# Patient Record
Sex: Male | Born: 1955
Health system: Southern US, Community
[De-identification: ages and names within clinical notes are randomized; demographics above are authoritative.]

## PROBLEM LIST (undated history)

## (undated) DIAGNOSIS — F0390 Unspecified dementia without behavioral disturbance: Secondary | ICD-10-CM

## (undated) DIAGNOSIS — I639 Cerebral infarction, unspecified: Secondary | ICD-10-CM

## (undated) DIAGNOSIS — J381 Polyp of vocal cord and larynx: Secondary | ICD-10-CM

## (undated) DIAGNOSIS — M1990 Unspecified osteoarthritis, unspecified site: Secondary | ICD-10-CM

## (undated) DIAGNOSIS — C801 Malignant (primary) neoplasm, unspecified: Secondary | ICD-10-CM

## (undated) DIAGNOSIS — I10 Essential (primary) hypertension: Secondary | ICD-10-CM

## (undated) DIAGNOSIS — N189 Chronic kidney disease, unspecified: Secondary | ICD-10-CM

## (undated) HISTORY — DX: Malignant (primary) neoplasm, unspecified: C80.1

## (undated) HISTORY — PX: STERNOTOMY: SHX1057

## (undated) HISTORY — DX: Cerebral infarction, unspecified: I63.9

## (undated) HISTORY — DX: Chronic kidney disease, unspecified: N18.9

## (undated) HISTORY — PX: THYMECTOMY: SHX1063

## (undated) HISTORY — PX: BACK SURGERY: SHX140

---

## 1986-11-25 ENCOUNTER — Encounter (INDEPENDENT_AMBULATORY_CARE_PROVIDER_SITE_OTHER): Payer: Self-pay | Admitting: Family Medicine

## 2001-11-12 ENCOUNTER — Emergency Department (HOSPITAL_COMMUNITY): Admission: EM | Admit: 2001-11-12 | Discharge: 2001-11-12 | Payer: Self-pay | Admitting: Emergency Medicine

## 2003-10-04 ENCOUNTER — Emergency Department (HOSPITAL_COMMUNITY): Admission: EM | Admit: 2003-10-04 | Discharge: 2003-10-04 | Payer: Self-pay | Admitting: Emergency Medicine

## 2004-07-04 ENCOUNTER — Emergency Department (HOSPITAL_COMMUNITY): Admission: EM | Admit: 2004-07-04 | Discharge: 2004-07-04 | Payer: Self-pay | Admitting: Emergency Medicine

## 2005-02-11 ENCOUNTER — Emergency Department (HOSPITAL_COMMUNITY): Admission: EM | Admit: 2005-02-11 | Discharge: 2005-02-11 | Payer: Self-pay | Admitting: Emergency Medicine

## 2006-09-13 ENCOUNTER — Emergency Department (HOSPITAL_COMMUNITY): Admission: EM | Admit: 2006-09-13 | Discharge: 2006-09-13 | Payer: Self-pay | Admitting: Emergency Medicine

## 2006-10-25 ENCOUNTER — Emergency Department (HOSPITAL_COMMUNITY): Admission: EM | Admit: 2006-10-25 | Discharge: 2006-10-25 | Payer: Self-pay | Admitting: Emergency Medicine

## 2006-11-07 ENCOUNTER — Emergency Department (HOSPITAL_COMMUNITY): Admission: EM | Admit: 2006-11-07 | Discharge: 2006-11-08 | Payer: Self-pay | Admitting: Emergency Medicine

## 2006-11-09 ENCOUNTER — Emergency Department (HOSPITAL_COMMUNITY): Admission: EM | Admit: 2006-11-09 | Discharge: 2006-11-09 | Payer: Self-pay | Admitting: Emergency Medicine

## 2007-07-29 ENCOUNTER — Emergency Department (HOSPITAL_COMMUNITY): Admission: EM | Admit: 2007-07-29 | Discharge: 2007-07-29 | Payer: Self-pay | Admitting: *Deleted

## 2007-08-18 ENCOUNTER — Telehealth (INDEPENDENT_AMBULATORY_CARE_PROVIDER_SITE_OTHER): Payer: Self-pay | Admitting: *Deleted

## 2007-08-18 ENCOUNTER — Ambulatory Visit (HOSPITAL_COMMUNITY): Admission: RE | Admit: 2007-08-18 | Discharge: 2007-08-18 | Payer: Self-pay | Admitting: Family Medicine

## 2007-08-18 ENCOUNTER — Ambulatory Visit: Payer: Self-pay | Admitting: Family Medicine

## 2007-08-18 DIAGNOSIS — M129 Arthropathy, unspecified: Secondary | ICD-10-CM | POA: Insufficient documentation

## 2007-08-18 DIAGNOSIS — E669 Obesity, unspecified: Secondary | ICD-10-CM | POA: Insufficient documentation

## 2007-08-18 DIAGNOSIS — R5383 Other fatigue: Secondary | ICD-10-CM

## 2007-08-18 DIAGNOSIS — R5381 Other malaise: Secondary | ICD-10-CM

## 2007-08-18 DIAGNOSIS — M5137 Other intervertebral disc degeneration, lumbosacral region: Secondary | ICD-10-CM

## 2007-08-18 DIAGNOSIS — I10 Essential (primary) hypertension: Secondary | ICD-10-CM

## 2007-08-19 LAB — CONVERTED CEMR LAB
ALT: 20 units/L (ref 0–53)
BUN: 13 mg/dL (ref 6–23)
Basophils Relative: 1 % (ref 0–1)
Cholesterol: 162 mg/dL (ref 0–200)
Eosinophils Absolute: 0.1 10*3/uL (ref 0.0–0.7)
Eosinophils Relative: 3 % (ref 0–5)
Hemoglobin: 15.3 g/dL (ref 13.0–17.0)
Lymphocytes Relative: 53 % — ABNORMAL HIGH (ref 12–46)
Lymphs Abs: 2.6 10*3/uL (ref 0.7–3.3)
MCV: 101.7 fL — ABNORMAL HIGH (ref 78.0–100.0)
Monocytes Absolute: 0.4 10*3/uL (ref 0.2–0.7)
PSA: 0.48 ng/mL (ref 0.10–4.00)
Potassium: 4.4 meq/L (ref 3.5–5.3)
RBC: 4.66 M/uL (ref 4.22–5.81)
RDW: 13 % (ref 11.5–14.0)
Sodium: 140 meq/L (ref 135–145)
Total Bilirubin: 0.5 mg/dL (ref 0.3–1.2)
Total CHOL/HDL Ratio: 4
Total Protein: 7.2 g/dL (ref 6.0–8.3)
WBC: 5 10*3/uL (ref 4.0–10.5)

## 2007-09-11 ENCOUNTER — Ambulatory Visit: Payer: Self-pay | Admitting: Family Medicine

## 2007-09-11 LAB — CONVERTED CEMR LAB
Cholesterol, target level: 200 mg/dL
LDL Goal: 130 mg/dL
OCCULT 1: NEGATIVE

## 2007-12-11 ENCOUNTER — Ambulatory Visit: Payer: Self-pay | Admitting: Family Medicine

## 2008-04-17 ENCOUNTER — Ambulatory Visit: Payer: Self-pay | Admitting: Family Medicine

## 2008-04-17 DIAGNOSIS — L259 Unspecified contact dermatitis, unspecified cause: Secondary | ICD-10-CM

## 2008-05-16 ENCOUNTER — Encounter (INDEPENDENT_AMBULATORY_CARE_PROVIDER_SITE_OTHER): Payer: Self-pay | Admitting: Family Medicine

## 2008-09-11 IMAGING — CT CT ABDOMEN W/ CM
1 of 3 series · 14 of 32 positions shown, 19 images · IV contrast (CONTRAST)
Comparison: none

HISTORY: Abdominal pain, nausea, vomiting

[Series 3365: — · axial · 0.77mm/px · z∈[+1286,+1716]mm · 14 of 98 slices shown, 19 images]
[im 6/98  soft-tissue]
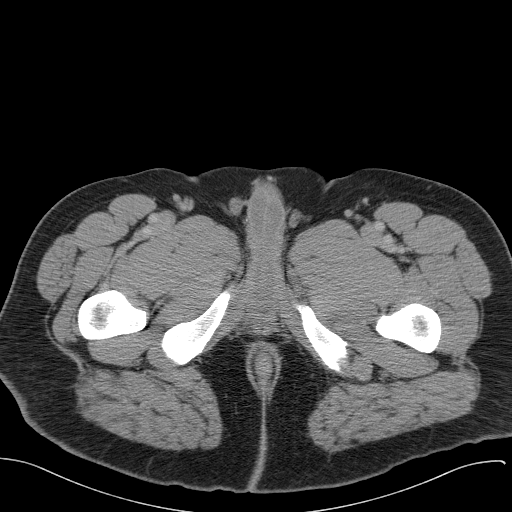
[im 6/98  bone]
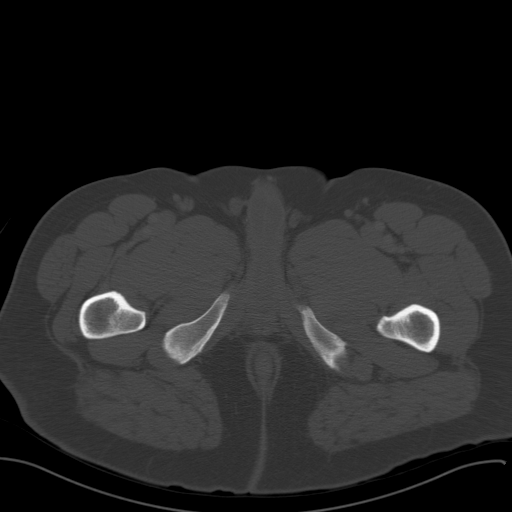
[im 12/98  soft-tissue]
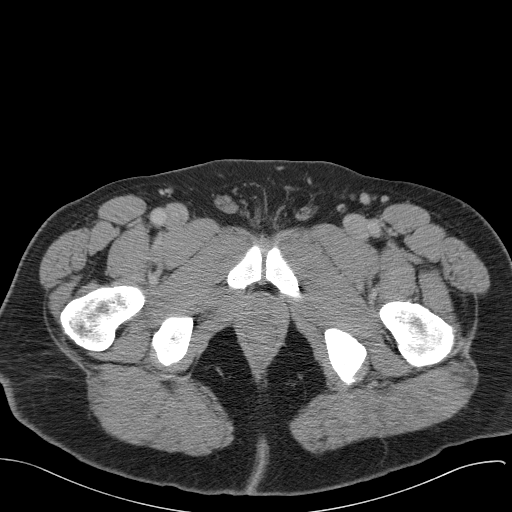
[im 23/98  soft-tissue]
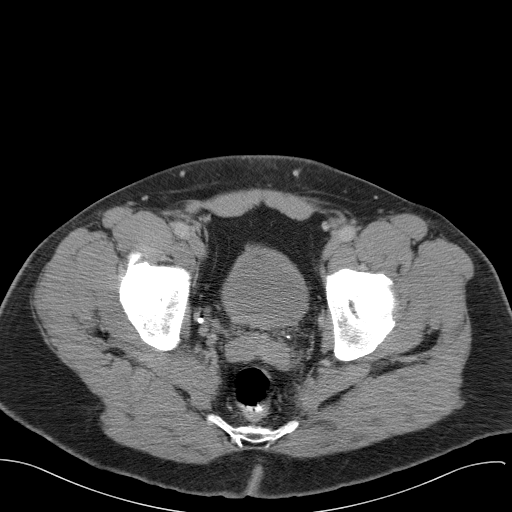
[im 29/98  soft-tissue]
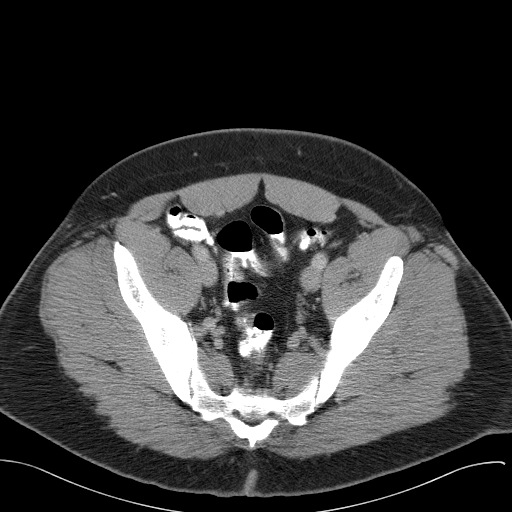
[im 35/98  soft-tissue]
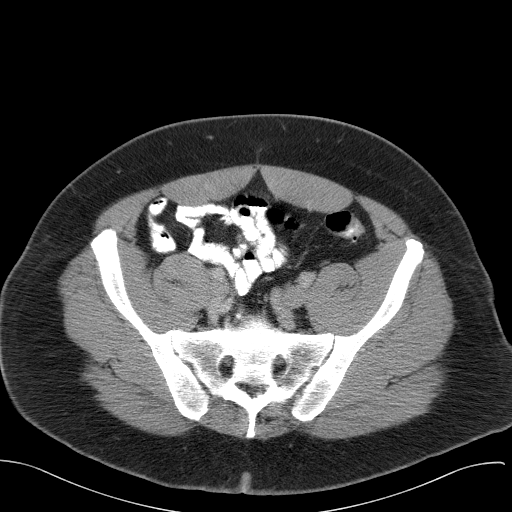
[im 40/98  soft-tissue]
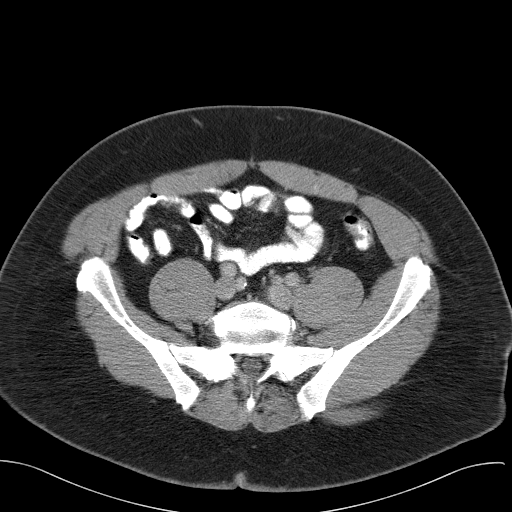
[im 52/98  soft-tissue]
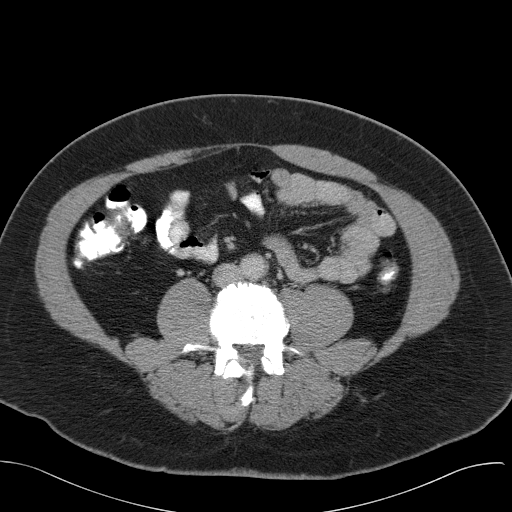
[im 58/98  soft-tissue]
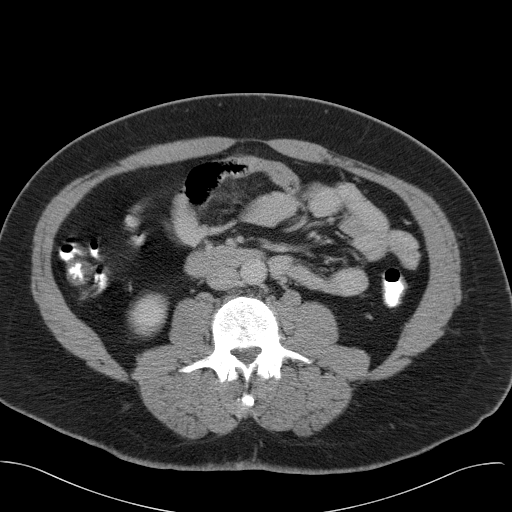
[im 63/98  soft-tissue]
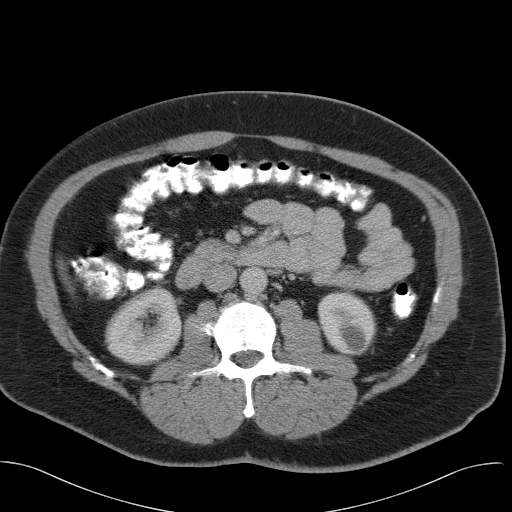
[im 63/98  bone]
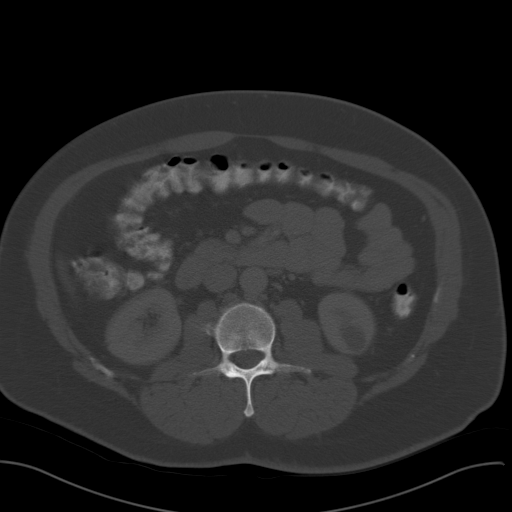
[im 69/98  soft-tissue]
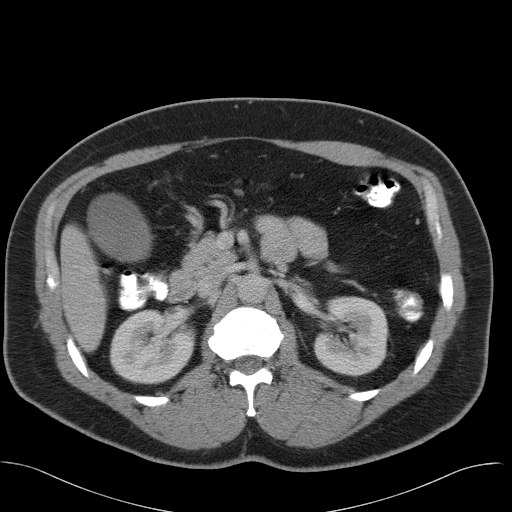
[im 75/98  soft-tissue]
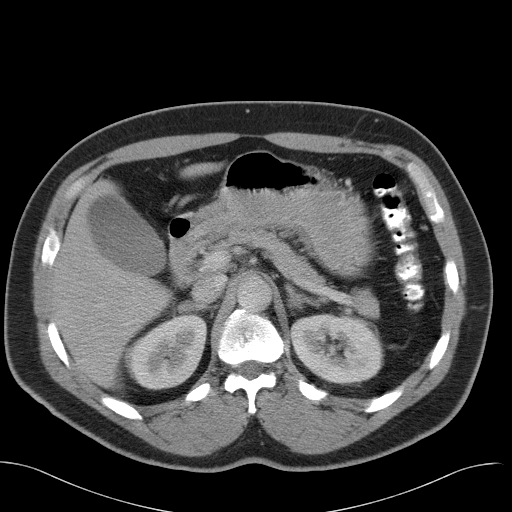
[im 75/98  lung]
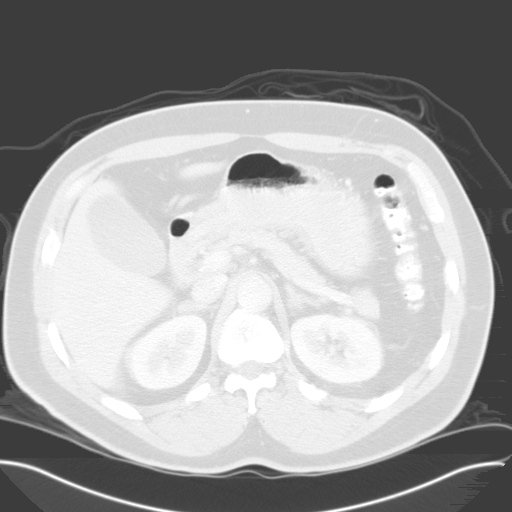
[im 80/98  lung]
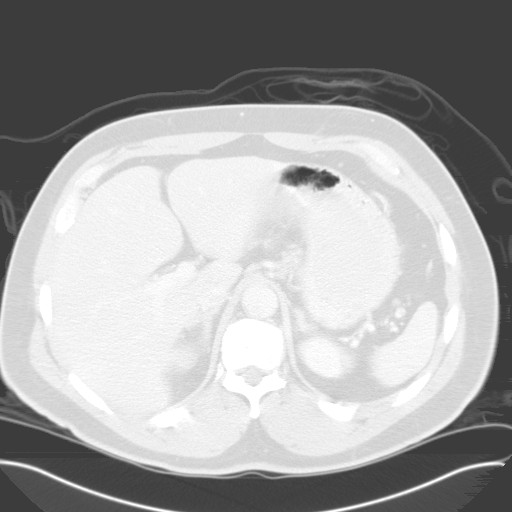
[im 86/98  soft-tissue]
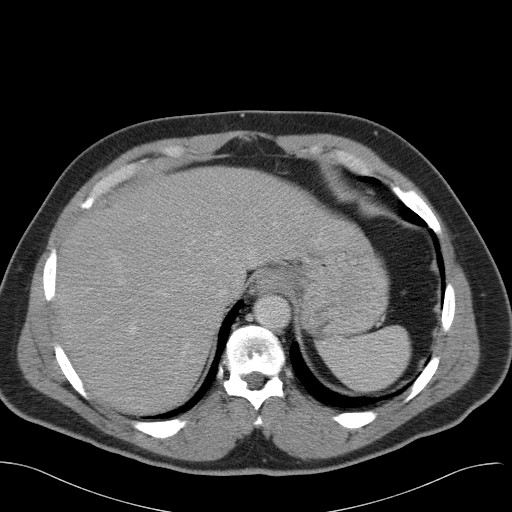
[im 86/98  lung]
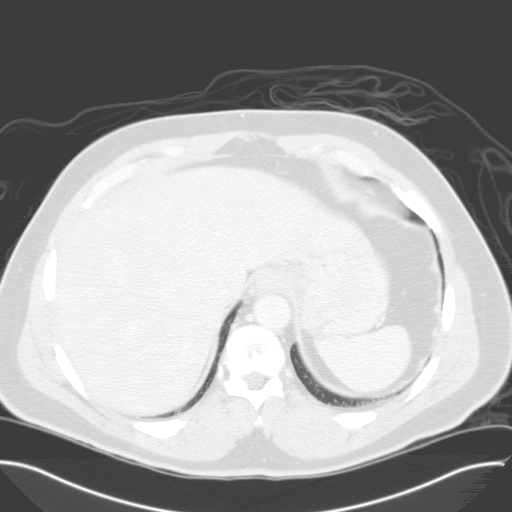
[im 92/98  soft-tissue]
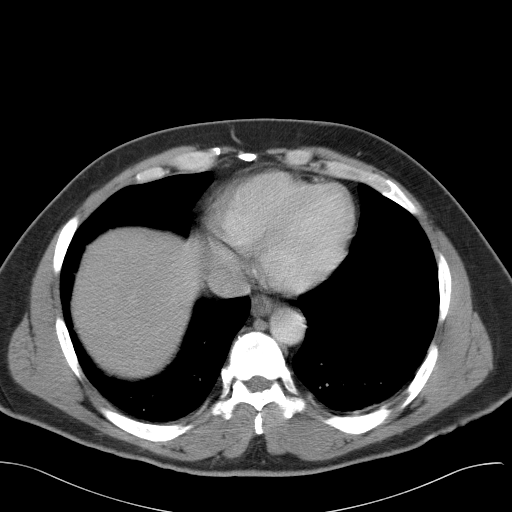
[im 92/98  lung]
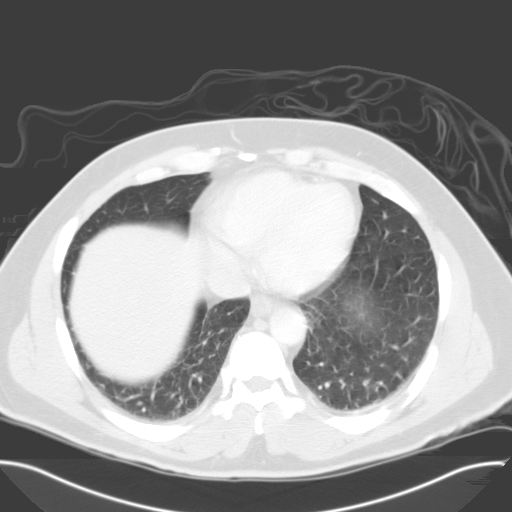

[14 of 32 positions shown; findings below may reference images not displayed]

CT ABDOMEN AND PELVIS WITH CONTRAST:

Multidetector helical CT imaging abdomen and pelvis performed.
Exam utilized dilute oral contrast and 100 cc [id number]-A33.
No prior studies for comparison.

CT ABDOMEN:

Minimal dependent atelectasis left lower lobe.
Small cyst lower pole left kidney 2.1 x 1.8 cm image 35.
Tiny low attenuation foci right kidney too small to characterize.
No other abnormalities of liver, spleen, pancreas, kidneys, or left adrenal
gland.
Low attenuation nodule right adrenal gland, 1.7 x 1.2 cm image 17, likely tiny
adenoma.
No upper abdominal mass, adenopathy, free fluid, or inflammatory process.
IMPRESSION: Small left renal cyst.
Probable small right adrenal adenoma.
Minimal left lower lobe atelectasis.

CT PELVIS:

Normal appendix.
No pelvic mass, adenopathy, or free fluid.
Pelvic bowel loops normal.
Scattered pelvic phleboliths without distal ureteral calcification or
dilatation.
Bladder and bones unremarkable.
IMPRESSION: No acute pelvic abnormalities.

## 2008-09-14 ENCOUNTER — Emergency Department (HOSPITAL_COMMUNITY): Admission: EM | Admit: 2008-09-14 | Discharge: 2008-09-14 | Payer: Self-pay | Admitting: Emergency Medicine

## 2008-10-22 ENCOUNTER — Emergency Department (HOSPITAL_COMMUNITY): Admission: EM | Admit: 2008-10-22 | Discharge: 2008-10-22 | Payer: Self-pay | Admitting: Emergency Medicine

## 2009-07-04 ENCOUNTER — Emergency Department (HOSPITAL_COMMUNITY): Admission: EM | Admit: 2009-07-04 | Discharge: 2009-07-04 | Payer: Self-pay | Admitting: Emergency Medicine

## 2009-10-15 ENCOUNTER — Emergency Department (HOSPITAL_COMMUNITY): Admission: EM | Admit: 2009-10-15 | Discharge: 2009-10-15 | Payer: Self-pay | Admitting: Emergency Medicine

## 2011-04-04 LAB — DIFFERENTIAL
Basophils Absolute: 0.1 10*3/uL (ref 0.0–0.1)
Basophils Relative: 1 % (ref 0–1)
Eosinophils Relative: 2 % (ref 0–5)
Lymphocytes Relative: 40 % (ref 12–46)
Neutro Abs: 3.3 10*3/uL (ref 1.7–7.7)

## 2011-04-04 LAB — CBC
Hemoglobin: 15.1 g/dL (ref 13.0–17.0)
RBC: 4.37 MIL/uL (ref 4.22–5.81)
RDW: 13.1 % (ref 11.5–15.5)
WBC: 7.1 10*3/uL (ref 4.0–10.5)

## 2011-04-04 LAB — BASIC METABOLIC PANEL
Calcium: 9.2 mg/dL (ref 8.4–10.5)
GFR calc Af Amer: >60 mL/min (ref >60)
GFR calc non Af Amer: >60 mL/min (ref >60)
Sodium: 138 mEq/L (ref 135–145)

## 2011-09-27 LAB — GLUCOSE, CAPILLARY

## 2012-02-12 ENCOUNTER — Encounter (HOSPITAL_COMMUNITY): Payer: Self-pay | Admitting: *Deleted

## 2012-02-12 ENCOUNTER — Inpatient Hospital Stay (HOSPITAL_COMMUNITY)
Admission: EM | Admit: 2012-02-12 | Discharge: 2012-02-14 | DRG: 066 | Disposition: A | Discharge status: Home / Self Care | Payer: Medicaid Other | Attending: Internal Medicine | Admitting: Internal Medicine

## 2012-02-12 ENCOUNTER — Emergency Department (HOSPITAL_COMMUNITY): Payer: Medicaid Other

## 2012-02-12 ENCOUNTER — Other Ambulatory Visit: Payer: Self-pay

## 2012-02-12 DIAGNOSIS — M129 Arthropathy, unspecified: Secondary | ICD-10-CM | POA: Diagnosis present

## 2012-02-12 DIAGNOSIS — R209 Unspecified disturbances of skin sensation: Secondary | ICD-10-CM | POA: Diagnosis present

## 2012-02-12 DIAGNOSIS — I6350 Cerebral infarction due to unspecified occlusion or stenosis of unspecified cerebral artery: Principal | ICD-10-CM | POA: Diagnosis present

## 2012-02-12 DIAGNOSIS — R5381 Other malaise: Secondary | ICD-10-CM

## 2012-02-12 DIAGNOSIS — G839 Paralytic syndrome, unspecified: Secondary | ICD-10-CM | POA: Diagnosis present

## 2012-02-12 DIAGNOSIS — I639 Cerebral infarction, unspecified: Secondary | ICD-10-CM

## 2012-02-12 DIAGNOSIS — E669 Obesity, unspecified: Secondary | ICD-10-CM

## 2012-02-12 DIAGNOSIS — M5137 Other intervertebral disc degeneration, lumbosacral region: Secondary | ICD-10-CM

## 2012-02-12 DIAGNOSIS — I10 Essential (primary) hypertension: Secondary | ICD-10-CM | POA: Diagnosis present

## 2012-02-12 DIAGNOSIS — Z720 Tobacco use: Secondary | ICD-10-CM | POA: Diagnosis present

## 2012-02-12 DIAGNOSIS — F17200 Nicotine dependence, unspecified, uncomplicated: Secondary | ICD-10-CM | POA: Diagnosis present

## 2012-02-12 DIAGNOSIS — Z23 Encounter for immunization: Secondary | ICD-10-CM

## 2012-02-12 DIAGNOSIS — E876 Hypokalemia: Secondary | ICD-10-CM | POA: Diagnosis present

## 2012-02-12 DIAGNOSIS — L259 Unspecified contact dermatitis, unspecified cause: Secondary | ICD-10-CM

## 2012-02-12 HISTORY — DX: Essential (primary) hypertension: I10

## 2012-02-12 HISTORY — DX: Unspecified osteoarthritis, unspecified site: M19.90

## 2012-02-12 LAB — COMPREHENSIVE METABOLIC PANEL
ALT: 8 U/L (ref 0–53)
Albumin: 3.6 g/dL (ref 3.5–5.2)
CO2: 27 mEq/L (ref 19–32)
Creatinine, Ser: 1.1 mg/dL (ref 0.50–1.35)
GFR calc Af Amer: 86 mL/min — ABNORMAL LOW (ref >90)
Total Bilirubin: 0.4 mg/dL (ref 0.3–1.2)

## 2012-02-12 LAB — CBC
MCV: 95.9 fL (ref 78.0–100.0)
Platelets: 322 10*3/uL (ref 150–400)
RBC: 4.58 MIL/uL (ref 4.22–5.81)

## 2012-02-12 LAB — DIFFERENTIAL
Basophils Absolute: 0 10*3/uL (ref 0.0–0.1)
Basophils Relative: 1 % (ref 0–1)
Lymphocytes Relative: 52 % — ABNORMAL HIGH (ref 12–46)
Monocytes Absolute: 0.5 10*3/uL (ref 0.1–1.0)
Monocytes Relative: 9 % (ref 3–12)
Neutro Abs: 2 10*3/uL (ref 1.7–7.7)
Neutrophils Relative %: 35 % — ABNORMAL LOW (ref 43–77)

## 2012-02-12 LAB — POCT I-STAT TROPONIN I

## 2012-02-12 LAB — PROTIME-INR: INR: 1.05 (ref 0.00–1.49)

## 2012-02-12 MED ORDER — NICOTINE 14 MG/24HR TD PT24
14.0000 mg | MEDICATED_PATCH | Freq: Every day | TRANSDERMAL | Status: DC
  Administered 2012-02-12 – 2012-02-13 (×2): 14 mg via TRANSDERMAL
  Filled 2012-02-12 (×2): qty 1

## 2012-02-12 MED ORDER — ONDANSETRON HCL 4 MG/2ML IJ SOLN: 4.0000 mg | Freq: Four times a day (QID) | INTRAMUSCULAR | Status: DC | PRN

## 2012-02-12 MED ORDER — POTASSIUM CHLORIDE CRYS ER 20 MEQ PO TBCR
40.0000 meq | EXTENDED_RELEASE_TABLET | Freq: Once | ORAL | Status: AC
  Administered 2012-02-12: 40 meq via ORAL
  Filled 2012-02-12: qty 2

## 2012-02-12 MED ORDER — ASPIRIN 325 MG PO TABS
325.0000 mg | ORAL_TABLET | Freq: Every day | ORAL | Status: DC
  Administered 2012-02-13 – 2012-02-14 (×2): 325 mg via ORAL
  Filled 2012-02-12 (×2): qty 1

## 2012-02-12 MED ORDER — SODIUM CHLORIDE 0.9 % IV SOLN
INTRAVENOUS | Status: DC
  Administered 2012-02-12: via INTRAVENOUS

## 2012-02-12 MED ORDER — ASPIRIN 300 MG RE SUPP
300.0000 mg | Freq: Every day | RECTAL | Status: DC
Start: 2012-02-13 — End: 2012-02-13
  Filled 2012-02-12 (×2): qty 1

## 2012-02-12 MED ORDER — SODIUM CHLORIDE 0.9 % IJ SOLN
3.0000 mL | Freq: Two times a day (BID) | INTRAMUSCULAR | Status: DC
  Administered 2012-02-12 – 2012-02-13 (×2): 3 mL via INTRAVENOUS
  Filled 2012-02-12 (×3): qty 3

## 2012-02-12 MED ORDER — ENOXAPARIN SODIUM 40 MG/0.4ML ~~LOC~~ SOLN
40.0000 mg | SUBCUTANEOUS | Status: DC
  Administered 2012-02-12 – 2012-02-13 (×2): 40 mg via SUBCUTANEOUS
  Filled 2012-02-12 (×2): qty 0.4

## 2012-02-12 MED ORDER — SENNOSIDES-DOCUSATE SODIUM 8.6-50 MG PO TABS: 1.0000 | ORAL_TABLET | Freq: Every evening | ORAL | Status: DC | PRN

## 2012-02-12 MED ORDER — ACETAMINOPHEN 650 MG RE SUPP: 650.0000 mg | RECTAL | Status: DC | PRN

## 2012-02-12 MED ORDER — HYDRALAZINE HCL 20 MG/ML IJ SOLN
10.0000 mg | Freq: Four times a day (QID) | INTRAMUSCULAR | Status: DC | PRN
  Administered 2012-02-13: 10 mg via INTRAVENOUS
  Filled 2012-02-12: qty 1

## 2012-02-12 MED ORDER — ACETAMINOPHEN 325 MG PO TABS: 650.0000 mg | ORAL_TABLET | ORAL | Status: DC | PRN

## 2012-02-12 NOTE — ED Notes (Signed)
Pt c/o left hand numbness since this morning.

## 2012-02-12 NOTE — ED Notes (Signed)
MD at bedside. 

## 2012-02-12 NOTE — ED Notes (Signed)
Pt has asked nurse to remove IV multiple times, states "I'ma gonna be all right", EDP in room to review results of CT and pt explained about leaving AMA and verbalizes understanding, pt to call wife

## 2012-02-12 NOTE — ED Notes (Signed)
Dr. Rito Ehrlich in room

## 2012-02-12 NOTE — ED Notes (Signed)
C/o left hand numbness and unable to grab items since this morning at 0930, grip is weaker to left hand than right hand, pt thought is was arthritis and applied Icy Hot cream to left hand

## 2012-02-12 NOTE — H&P (Signed)
Derrick Gaines is an 56 y.o. male.    PCP: [facility] in [employer] Complaint: Left hand numbness  HPI: This is a 56 year old, African American male, with a past medical history of hypertension, arthritis, who was in his usual state of health till this morning. When he woke up from his sleep and noticed that his left hand was numb. More specifically, he, said that his thumb and the index finger were numb. He couldn't button his shirt. He thought it was related to arthritis and took a couple of pain medications with no relief. His wife came home later in the evening and when he mentioned this to her she asked him to go to the emergency department. Patient denies any headache. Denies any seizure activity. Denies similar episodes in the past. Denies any difficulty with swallowing over the course of the day. Denies any difficulty with speech. He does notice some weakness in the left hand. Denies any weakness in the left leg. Denies any neck pain.   Home Medications: Prior to Admission medications   Medication Sig Start Date End Date Taking? Authorizing Provider  UNKNOWN TO PATIENT    Yes Historical Provider, MD   He thinks he takes Ramipril for hypertension, however, he is unsure of the dose.  Allergies: No Known Allergies  Past Medical History: Past Medical History  Diagnosis Date  . Hypertension   . Arthritis     Past Surgical History  Procedure Date  . Back surgery     Social History:  reports that he has been smoking.  He does not have any smokeless tobacco history on file. He reports that he does not drink alcohol or use illicit drugs.  Family History:  Family History  Problem Relation Age of Onset  . Diabetes Mother     Review of Systems - History obtained from the patient General ROS: negative Psychological ROS: negative Ophthalmic ROS: negative ENT ROS: negative Allergy and Immunology ROS: negative Hematological and Lymphatic ROS:  negative Endocrine ROS: negative Respiratory ROS: no cough, shortness of breath, or wheezing Cardiovascular ROS: no chest pain or dyspnea on exertion Gastrointestinal ROS: no abdominal pain, change in bowel habits, or black or bloody stools Genito-Urinary ROS: no dysuria, trouble voiding, or hematuria Musculoskeletal ROS: negative Neurological ROS: as in hpi Dermatological ROS: Negative  Physical Examination Blood pressure 153/83, pulse 73, temperature 98.5 F (36.9 C), resp. rate 20, weight 120.657 kg (266 lb), SpO2 99.00%.  General appearance: alert, cooperative, appears stated age and no distress Head: Normocephalic, without obvious abnormality Eyes: conjunctivae/corneas clear. PERRL, EOM's intact.  Throat: lips, mucosa, and tongue normal; teeth and gums normal Neck: no adenopathy, no carotid bruit, no JVD, supple, symmetrical, trachea midline and thyroid not enlarged, symmetric, no tenderness/mass/nodules Back: symmetric, no curvature. ROM normal. No CVA tenderness. Resp: clear to auscultation bilaterally Cardio: regular rate and rhythm, S1, S2 normal, no murmur, click, rub or gallop GI: soft, non-tender; bowel sounds normal; no masses,  no organomegaly Extremities: extremities normal, atraumatic, no cyanosis or edema Pulses: 2+ and symmetric Skin: Skin color, texture, turgor normal. No rashes or lesions Lymph nodes: Cervical, supraclavicular, and axillary nodes normal. Neurologic: There was a left facial droop. Strength was completely normal in the right side, as well as in the left lower extremity. Most of the abnormalities were noted in the left upper extremity specifically, in the hand. He did have 3/5 weakness in the joints of the thumb and index finger. He could not make  a fist properly. Couldn't grasp my fingers, with adequate strength. Pronator drift was noted on the left side. Celebellar signs were sluggish on the left side. Finger to nose was significantly impaired on the  left side. Gait was not assessed. There was decreased sensation in those 2 fingers as well.`  Laboratory Data: Results for orders placed during the hospital encounter of 02/12/12 (from the past 48 hour(s))  CBC     Status: Normal   Collection Time   02/12/12  8:03 PM      Component Value Range Comment   WBC 5.6  4.0 - 10.5 (K/uL)    RBC 4.58  4.22 - 5.81 (MIL/uL)    Hemoglobin 15.0  13.0 - 17.0 (g/dL)    HCT 16.1  09.6 - 04.5 (%)    MCV 95.9  78.0 - 100.0 (fL)    MCH 32.8  26.0 - 34.0 (pg)    MCHC 34.2  30.0 - 36.0 (g/dL)    RDW 40.9  81.1 - 91.4 (%)    Platelets 322  150 - 400 (K/uL)   DIFFERENTIAL     Status: Abnormal   Collection Time   02/12/12  8:03 PM      Component Value Range Comment   Neutrophils Relative 35 (*) 43 - 77 (%)    Neutro Abs 2.0  1.7 - 7.7 (K/uL)    Lymphocytes Relative 52 (*) 12 - 46 (%)    Lymphs Abs 2.9  0.7 - 4.0 (K/uL)    Monocytes Relative 9  3 - 12 (%)    Monocytes Absolute 0.5  0.1 - 1.0 (K/uL)    Eosinophils Relative 3  0 - 5 (%)    Eosinophils Absolute 0.2  0.0 - 0.7 (K/uL)    Basophils Relative 1  0 - 1 (%)    Basophils Absolute 0.0  0.0 - 0.1 (K/uL)   COMPREHENSIVE METABOLIC PANEL     Status: Abnormal   Collection Time   02/12/12  8:03 PM      Component Value Range Comment   Sodium 135  135 - 145 (mEq/L)    Potassium 3.2 (*) 3.5 - 5.1 (mEq/L)    Chloride 100  96 - 112 (mEq/L)    CO2 27  19 - 32 (mEq/L)    Glucose, Bld 103 (*) 70 - 99 (mg/dL)    BUN 8  6 - 23 (mg/dL)    Creatinine, Ser 7.82  0.50 - 1.35 (mg/dL)    Calcium 9.4  8.4 - 10.5 (mg/dL)    Total Protein 7.3  6.0 - 8.3 (g/dL)    Albumin 3.6  3.5 - 5.2 (g/dL)    AST 17  0 - 37 (U/L)    ALT 8  0 - 53 (U/L)    Alkaline Phosphatase 60  39 - 117 (U/L)    Total Bilirubin 0.4  0.3 - 1.2 (mg/dL)    GFR calc non Af Amer 74 (*) >90 (mL/min)    GFR calc Af Amer 86 (*) >90 (mL/min)   APTT     Status: Normal   Collection Time   02/12/12  8:03 PM      Component Value Range Comment   aPTT  36  24 - 37 (seconds)   PROTIME-INR     Status: Normal   Collection Time   02/12/12  8:03 PM      Component Value Range Comment   Prothrombin Time 13.9  11.6 - 15.2 (seconds)    INR 1.05  0.00 - 1.49    POCT I-STAT TROPONIN I     Status: Normal   Collection Time   02/12/12  8:06 PM      Component Value Range Comment   Troponin i, poc 0.00  0.00 - 0.08 (ng/mL)    Comment 3              Radiology Reports: Ct Head Wo Contrast  02/12/2012  *RADIOLOGY REPORT*  Clinical Data:  History of left hand numbness, hypertension and blood thinners.  CT HEAD WITHOUT CONTRAST CT CERVICAL SPINE WITHOUT CONTRAST  Technique:  Multidetector CT imaging of the head and cervical spine was performed following the standard protocol without intravenous contrast.  Multiplanar CT image reconstructions of the cervical spine were also generated.  Comparison:   None  CT HEAD  Findings:  There is low density in the right white matter tract and the right internal capsule.  Findings are most compatible with prior ischemic changes.  Difficult to exclude an acute component.  There is deformity of the left temporal bone and left lower frontal bone suggesting an old fracture.  This low density along the periphery of the left frontal and temporal lobes that may be artifactual.  There is no evidence to suggest acute hemorrhage.  No evidence for a mass lesion, midline shift or hydrocephalus.  Mild mucosal disease in the right ethmoid air cells and right frontal sinus.  Deformity of the bilateral nasal bones.  IMPRESSION: Evidence for old ischemic changes in the right white matter tract and right internal capsule.  An acute or subacute component in this area cannot be excluded and could be further evaluated with MRI.  Deformity along the left side of the skull is suggestive for an old injury.  There is also evidence of old nasal bone fractures.  No evidence for acute hemorrhage.  CT CERVICAL SPINE  Findings: Negative for acute fracture or  dislocation.  No evidence for soft tissue swelling in the neck.  Lung apices are clear. Normal alignment of the cervical spine, including the cervicothoracic junction.  IMPRESSION: No acute bony abnormality in the cervical spine.  Original Report Authenticated By: Richarda Overlie, M.D.   Ct Cervical Spine Wo Contrast  02/12/2012  *RADIOLOGY REPORT*  Clinical Data:  History of left hand numbness, hypertension and blood thinners.  CT HEAD WITHOUT CONTRAST CT CERVICAL SPINE WITHOUT CONTRAST  Technique:  Multidetector CT imaging of the head and cervical spine was performed following the standard protocol without intravenous contrast.  Multiplanar CT image reconstructions of the cervical spine were also generated.  Comparison:   None  CT HEAD  Findings:  There is low density in the right white matter tract and the right internal capsule.  Findings are most compatible with prior ischemic changes.  Difficult to exclude an acute component.  There is deformity of the left temporal bone and left lower frontal bone suggesting an old fracture.  This low density along the periphery of the left frontal and temporal lobes that may be artifactual.  There is no evidence to suggest acute hemorrhage.  No evidence for a mass lesion, midline shift or hydrocephalus.  Mild mucosal disease in the right ethmoid air cells and right frontal sinus.  Deformity of the bilateral nasal bones.  IMPRESSION: Evidence for old ischemic changes in the right white matter tract and right internal capsule.  An acute or subacute component in this area cannot be excluded and could be further evaluated with MRI.  Deformity along the  left side of the skull is suggestive for an old injury.  There is also evidence of old nasal bone fractures.  No evidence for acute hemorrhage.  CT CERVICAL SPINE  Findings: Negative for acute fracture or dislocation.  No evidence for soft tissue swelling in the neck.  Lung apices are clear. Normal alignment of the cervical spine,  including the cervicothoracic junction.  IMPRESSION: No acute bony abnormality in the cervical spine.  Original Report Authenticated By: Richarda Overlie, M.D.    Electrocardiogram: Sinus rhythm at 68 beats per minute. Axis is normal. Intervals appear to be in the normal range. No Q waves. No concerning ST or T-wave changes are noted.  Assessment/Plan  Principal Problem:  *CVA (cerebral vascular accident) Active Problems:  HYPERTENSION  ARTHRITIS  Tobacco abuse   #1 cerebrovascular accident: Patient will undergo the stroke workup. A urine drug screen will be checked. Speech therapy evaluation will be ordered. The RN in the emergency department tells me that he's passed his swallow screen. Physical therapy and occupational therapy will be asked to see this patient. Lipid panel will be checked in the morning, and he'll be put on aspirin for now. Patient will benefit from being on a statin medication. However, we will first await all the workup to be completed.  #2 history of hypertension. We will allow permissive hypertension for now. We will await verification of his home medications.  #3 tobacco abuse: Nicotine patch will prescribe. Counseling will be provided.  #4 hypokalemia: Potassium will be repleted. Labs will be rechecked in the morning. Magnesium level will be checked in the morning  DVT, prophylaxis will be provided. Patient is a full code.  Further management decisions will depend on results of further testing and patient's response to treatment  [facility] Pager [phone]  02/12/2012, 10:15 PM

## 2012-02-12 NOTE — ED Notes (Signed)
Attempted to call report to receiving nurse Misty Stanley, not able to take report and will call back, extension given

## 2012-02-12 NOTE — ED Provider Notes (Signed)
History     CSN: [id number]  Arrival date & time 02/12/12  6213   First MD Initiated Contact with Patient 02/12/12 1937      Chief Complaint  Patient presents with  . Extremity Weakness    (Consider location/radiation/quality/duration/timing/severity/associated sxs/prior treatment) HPI  He should relates he woke up this morning and his left hand was numb. He states the numbness extends to about the level of his wrist. He states he's been having difficulty making his hand form a grip or doing activities such as tying his shoes or buttoning his close. He denies any involvement of his face or left leg. He denies having difficulty walking, swallowing, drinking. He denies headache, neck pain. He states he's never had this happen before. He relates he's been unemployed and he's been under a lot of stress recently. He states the numbness is not improving.   PCP [facility]  Past Medical History  Diagnosis Date  . Hypertension     Past Surgical History  Procedure Date  . Back surgery     History reviewed. No pertinent family history.  History  Substance Use Topics  . Smoking status: Current Everyday Smoker 1 ppd  . Smokeless tobacco: Not on file  . Alcohol Use: No   unemployed    Review of Systems  All other systems reviewed and are negative.    Allergies  Review of patient's allergies indicates no known allergies.  Home Medications   Current Outpatient Rx  Name Route Sig Dispense Refill  . UNKNOWN TO PATIENT      ? ramipril   BP 154/100  Temp 97.9 F (36.6 C)  Resp 20  Wt 266 lb (120.657 kg)  SpO2 100%  Vital signs normal except mild hypertension   Physical Exam  Nursing note and vitals reviewed. Constitutional: He is oriented to person, place, and time. He appears well-developed and well-nourished.  Non-toxic appearance. He does not appear ill. No distress.  HENT:  Head: Normocephalic and atraumatic.  Right Ear: External ear normal.  Left Ear:  External ear normal.  Nose: Nose normal. No mucosal edema or rhinorrhea.  Mouth/Throat: Oropharynx is clear and moist and mucous membranes are normal. No dental abscesses or uvula swelling.  Eyes: Conjunctivae and EOM are normal. Pupils are equal, round, and reactive to light.  Neck: Normal range of motion and full passive range of motion without pain. Neck supple.  Cardiovascular: Normal rate, regular rhythm and normal heart sounds.  Exam reveals no gallop and no friction rub.   No murmur heard. Pulmonary/Chest: Effort normal and breath sounds normal. No respiratory distress. He has no wheezes. He has no rhonchi. He has no rales. He exhibits no tenderness and no crepitus.  Abdominal: Soft. Normal appearance and bowel sounds are normal. He exhibits no distension. There is no tenderness. There is no rebound and no guarding.  Musculoskeletal: Normal range of motion. He exhibits no edema and no tenderness.       Moves all extremities well.   Neurological: He is alert and oriented to person, place, and time. He has normal strength. No cranial nerve deficit.       Patient has pronator drift on the left, he has mild weaknee of hisleft grip compared to the right. Other wise he is neurologically intact.  Skin: Skin is warm, dry and intact. No rash noted. No erythema. No pallor.  Psychiatric: He has a normal mood and affect. His speech is normal and behavior is normal. His mood appears  not anxious.    ED Course  Procedures (including critical care time)  Patient given his CT report and 5 she should stay in the hospital. Patient does not want to stay. Called his wife and she told him he needs to stay in the hospital so now patient is agreeable to stay.  21:42 Dr Rito Ehrlich admit to tele to him    Results for orders placed during the hospital encounter of 02/12/12  CBC      Component Value Range   WBC 5.6  4.0 - 10.5 (K/uL)   RBC 4.58  4.22 - 5.81 (MIL/uL)   Hemoglobin 15.0  13.0 - 17.0 (g/dL)    HCT 09.6  04.5 - 40.9 (%)   MCV 95.9  78.0 - 100.0 (fL)   MCH 32.8  26.0 - 34.0 (pg)   MCHC 34.2  30.0 - 36.0 (g/dL)   RDW 81.1  91.4 - 78.2 (%)   Platelets 322  150 - 400 (K/uL)  DIFFERENTIAL      Component Value Range   Neutrophils Relative 35 (*) 43 - 77 (%)   Neutro Abs 2.0  1.7 - 7.7 (K/uL)   Lymphocytes Relative 52 (*) 12 - 46 (%)   Lymphs Abs 2.9  0.7 - 4.0 (K/uL)   Monocytes Relative 9  3 - 12 (%)   Monocytes Absolute 0.5  0.1 - 1.0 (K/uL)   Eosinophils Relative 3  0 - 5 (%)   Eosinophils Absolute 0.2  0.0 - 0.7 (K/uL)   Basophils Relative 1  0 - 1 (%)   Basophils Absolute 0.0  0.0 - 0.1 (K/uL)  COMPREHENSIVE METABOLIC PANEL      Component Value Range   Sodium 135  135 - 145 (mEq/L)   Potassium 3.2 (*) 3.5 - 5.1 (mEq/L)   Chloride 100  96 - 112 (mEq/L)   CO2 27  19 - 32 (mEq/L)   Glucose, Bld 103 (*) 70 - 99 (mg/dL)   BUN 8  6 - 23 (mg/dL)   Creatinine, Ser 9.56  0.50 - 1.35 (mg/dL)   Calcium 9.4  8.4 - 21.3 (mg/dL)   Total Protein 7.3  6.0 - 8.3 (g/dL)   Albumin 3.6  3.5 - 5.2 (g/dL)   AST 17  0 - 37 (U/L)   ALT 8  0 - 53 (U/L)   Alkaline Phosphatase 60  39 - 117 (U/L)   Total Bilirubin 0.4  0.3 - 1.2 (mg/dL)   GFR calc non Af Amer 74 (*) >90 (mL/min)   GFR calc Af Amer 86 (*) >90 (mL/min)  APTT      Component Value Range   aPTT 36  24 - 37 (seconds)  PROTIME-INR      Component Value Range   Prothrombin Time 13.9  11.6 - 15.2 (seconds)   INR 1.05  0.00 - 1.49   POCT I-STAT TROPONIN I      Component Value Range   Troponin i, poc 0.00  0.00 - 0.08 (ng/mL)   Comment 3            Laboratory interpretation all normal except hypokalemia   Ct Head Wo Contrast  02/12/2012  *RADIOLOGY REPORT*  Clinical Data:  History of left hand numbness, hypertension and blood thinners.  CT HEAD WITHOUT CONTRAST CT CERVICAL SPINE WITHOUT CONTRAST  Technique:  Multidetector CT imaging of the head and cervical spine was performed following the standard protocol without intravenous  contrast.  Multiplanar CT image reconstructions of the cervical spine  were also generated.  Comparison:   None  CT HEAD  Findings:  There is low density in the right white matter tract and the right internal capsule.  Findings are most compatible with prior ischemic changes.  Difficult to exclude an acute component.  There is deformity of the left temporal bone and left lower frontal bone suggesting an old fracture.  This low density along the periphery of the left frontal and temporal lobes that may be artifactual.  There is no evidence to suggest acute hemorrhage.  No evidence for a mass lesion, midline shift or hydrocephalus.  Mild mucosal disease in the right ethmoid air cells and right frontal sinus.  Deformity of the bilateral nasal bones.  IMPRESSION: Evidence for old ischemic changes in the right white matter tract and right internal capsule.  An acute or subacute component in this area cannot be excluded and could be further evaluated with MRI.  Deformity along the left side of the skull is suggestive for an old injury.  There is also evidence of old nasal bone fractures.  No evidence for acute hemorrhage.  CT CERVICAL SPINE  Findings: Negative for acute fracture or dislocation.  No evidence for soft tissue swelling in the neck.  Lung apices are clear. Normal alignment of the cervical spine, including the cervicothoracic junction.  IMPRESSION: No acute bony abnormality in the cervical spine.  Original Report Authenticated By: Richarda Overlie, M.D.   Ct Cervical Spine Wo Contrast  02/12/2012  *RADIOLOGY REPORT*  Clinical Data:  History of left hand numbness, hypertension and blood thinners.  CT HEAD WITHOUT CONTRAST CT CERVICAL SPINE WITHOUT CONTRAST  Technique:  Multidetector CT imaging of the head and cervical spine was performed following the standard protocol without intravenous contrast.  Multiplanar CT image reconstructions of the cervical spine were also generated.  Comparison:   None  CT HEAD  Findings:   There is low density in the right white matter tract and the right internal capsule.  Findings are most compatible with prior ischemic changes.  Difficult to exclude an acute component.  There is deformity of the left temporal bone and left lower frontal bone suggesting an old fracture.  This low density along the periphery of the left frontal and temporal lobes that may be artifactual.  There is no evidence to suggest acute hemorrhage.  No evidence for a mass lesion, midline shift or hydrocephalus.  Mild mucosal disease in the right ethmoid air cells and right frontal sinus.  Deformity of the bilateral nasal bones.  IMPRESSION: Evidence for old ischemic changes in the right white matter tract and right internal capsule.  An acute or subacute component in this area cannot be excluded and could be further evaluated with MRI.  Deformity along the left side of the skull is suggestive for an old injury.  There is also evidence of old nasal bone fractures.  No evidence for acute hemorrhage.  CT CERVICAL SPINE  Findings: Negative for acute fracture or dislocation.  No evidence for soft tissue swelling in the neck.  Lung apices are clear. Normal alignment of the cervical spine, including the cervicothoracic junction.  IMPRESSION: No acute bony abnormality in the cervical spine.  Original Report Authenticated By: Richarda Overlie, M.D.     Date: 02/12/2012  Rate: 68  Rhythm: normal sinus rhythm  QRS Axis: normal  Intervals: normal  ST/T Wave abnormalities: nonspecific T wave changes  Conduction Disutrbances:none  Narrative Interpretation:   Old EKG Reviewed: none available    1.  Stroke   2. Hypokalemia    Plan admission  Devoria Albe, MD, FACEP    MDM          Ward Givens, MD 02/12/12 2147

## 2012-02-13 ENCOUNTER — Inpatient Hospital Stay (HOSPITAL_COMMUNITY): Payer: Medicaid Other

## 2012-02-13 LAB — RAPID URINE DRUG SCREEN, HOSP PERFORMED
Amphetamines: NOT DETECTED
Benzodiazepines: NOT DETECTED
Tetrahydrocannabinol: NOT DETECTED

## 2012-02-13 LAB — LIPID PANEL
Cholesterol: 145 mg/dL (ref 0–200)
HDL: 38 mg/dL — ABNORMAL LOW (ref >39)
LDL Cholesterol: 71 mg/dL (ref 0–99)
Triglycerides: 182 mg/dL — ABNORMAL HIGH (ref <150)

## 2012-02-13 LAB — COMPREHENSIVE METABOLIC PANEL
ALT: 8 U/L (ref 0–53)
BUN: 7 mg/dL (ref 6–23)
CO2: 27 mEq/L (ref 19–32)
Calcium: 8.8 mg/dL (ref 8.4–10.5)
Creatinine, Ser: 1.15 mg/dL (ref 0.50–1.35)
GFR calc Af Amer: 82 mL/min — ABNORMAL LOW (ref >90)
GFR calc non Af Amer: 70 mL/min — ABNORMAL LOW (ref >90)
Glucose, Bld: 100 mg/dL — ABNORMAL HIGH (ref 70–99)
Total Protein: 6.5 g/dL (ref 6.0–8.3)

## 2012-02-13 LAB — CBC
HCT: 41 % (ref 39.0–52.0)
Hemoglobin: 13.9 g/dL (ref 13.0–17.0)
MCH: 32.7 pg (ref 26.0–34.0)
MCHC: 33.9 g/dL (ref 30.0–36.0)
MCV: 96.5 fL (ref 78.0–100.0)
RBC: 4.25 MIL/uL (ref 4.22–5.81)

## 2012-02-13 LAB — HEMOGLOBIN A1C
Hgb A1c MFr Bld: 4.7 % (ref <5.7)
Mean Plasma Glucose: 88 mg/dL (ref <117)

## 2012-02-13 LAB — MAGNESIUM: Magnesium: 2.1 mg/dL (ref 1.5–2.5)

## 2012-02-13 MED ORDER — HYDROCHLOROTHIAZIDE 25 MG PO TABS
12.5000 mg | ORAL_TABLET | Freq: Every day | ORAL | Status: DC
  Administered 2012-02-13 – 2012-02-14 (×2): 12.5 mg via ORAL
  Filled 2012-02-13 (×2): qty 1

## 2012-02-13 NOTE — Progress Notes (Signed)
Paged hospitalist to report elevated BP. Dr Rito Ehrlich sent message. Returned call. Orders given for hydralazine IV with parameters. Instructed to give if next BP reading continued to be elevated.

## 2012-02-13 NOTE — Progress Notes (Signed)
Slept well this shift. No changes from initial assessment. States some feeling is back in his left hand. Remains unable to grip with left hand. Uneventful shift.

## 2012-02-13 NOTE — Progress Notes (Signed)
Writer notified Dr. Karilyn Cota regarding pt's BP of 156/105 and gave hydralazine PRN ordered.  Pt wanting to restart his home BP med.  Received new orders and followed.

## 2012-02-13 NOTE — Progress Notes (Signed)
Subjective: This man was admitted with left index finger and middle finger numbness and weakness. He did not have any arm or leg weakness. He was noted to have left facial upper motor neuron weakness. CT head scan was unremarkable. He has had carotid Dopplers and results are awaited.           Physical Exam: Blood pressure 148/93, pulse 65, temperature 97.8 F (36.6 C), temperature source Oral, resp. rate 18, height 6\' 1"  (1.854 m), weight 98.7 kg (217 lb 9.5 oz), SpO2 98.00%. He looks systemically well. He does still have a left facial upper motor neuron weakness. His speech, however, is normal. He does have weakness in the left hand grip, power being 4/5. He also does have weakness in the left arm, flexion at the elbow being 4/5. His legs bilaterally are equal and normal strength.   Investigations:     Basic Metabolic Panel:  Basename 02/13/12 0432 02/12/12 2003  NA 135 135  K 3.5 3.2*  CL 103 100  CO2 27 27  GLUCOSE 100* 103*  BUN 7 8  CREATININE 1.15 1.10  CALCIUM 8.8 9.4  MG 2.1 --  PHOS -- --   Liver Function Tests:  [facility] 02/13/12 0432 02/12/12 2003  AST 16 17  ALT 8 8  ALKPHOS 54 60  BILITOT 0.5 0.4  PROT 6.5 7.3  ALBUMIN 3.1* 3.6     CBC:  Basename 02/13/12 0432 02/12/12 2003  WBC 5.0 5.6  NEUTROABS -- 2.0  HGB 13.9 15.0  HCT 41.0 43.9  MCV 96.5 95.9  PLT 310 322    Ct Head Wo Contrast  02/12/2012  *RADIOLOGY REPORT*  Clinical Data:  History of left hand numbness, hypertension and blood thinners.  CT HEAD WITHOUT CONTRAST CT CERVICAL SPINE WITHOUT CONTRAST  Technique:  Multidetector CT imaging of the head and cervical spine was performed following the standard protocol without intravenous contrast.  Multiplanar CT image reconstructions of the cervical spine were also generated.  Comparison:   None  CT HEAD  Findings:  There is low density in the right white matter tract and the right internal capsule.  Findings are most compatible with prior  ischemic changes.  Difficult to exclude an acute component.  There is deformity of the left temporal bone and left lower frontal bone suggesting an old fracture.  This low density along the periphery of the left frontal and temporal lobes that may be artifactual.  There is no evidence to suggest acute hemorrhage.  No evidence for a mass lesion, midline shift or hydrocephalus.  Mild mucosal disease in the right ethmoid air cells and right frontal sinus.  Deformity of the bilateral nasal bones.  IMPRESSION: Evidence for old ischemic changes in the right white matter tract and right internal capsule.  An acute or subacute component in this area cannot be excluded and could be further evaluated with MRI.  Deformity along the left side of the skull is suggestive for an old injury.  There is also evidence of old nasal bone fractures.  No evidence for acute hemorrhage.  CT CERVICAL SPINE  Findings: Negative for acute fracture or dislocation.  No evidence for soft tissue swelling in the neck.  Lung apices are clear. Normal alignment of the cervical spine, including the cervicothoracic junction.  IMPRESSION: No acute bony abnormality in the cervical spine.  Original Report Authenticated By: Richarda Overlie, M.D.   Ct Cervical Spine Wo Contrast  02/12/2012  *RADIOLOGY REPORT*  Clinical Data:  History of  left hand numbness, hypertension and blood thinners.  CT HEAD WITHOUT CONTRAST CT CERVICAL SPINE WITHOUT CONTRAST  Technique:  Multidetector CT imaging of the head and cervical spine was performed following the standard protocol without intravenous contrast.  Multiplanar CT image reconstructions of the cervical spine were also generated.  Comparison:   None  CT HEAD  Findings:  There is low density in the right white matter tract and the right internal capsule.  Findings are most compatible with prior ischemic changes.  Difficult to exclude an acute component.  There is deformity of the left temporal bone and left lower frontal  bone suggesting an old fracture.  This low density along the periphery of the left frontal and temporal lobes that may be artifactual.  There is no evidence to suggest acute hemorrhage.  No evidence for a mass lesion, midline shift or hydrocephalus.  Mild mucosal disease in the right ethmoid air cells and right frontal sinus.  Deformity of the bilateral nasal bones.  IMPRESSION: Evidence for old ischemic changes in the right white matter tract and right internal capsule.  An acute or subacute component in this area cannot be excluded and could be further evaluated with MRI.  Deformity along the left side of the skull is suggestive for an old injury.  There is also evidence of old nasal bone fractures.  No evidence for acute hemorrhage.  CT CERVICAL SPINE  Findings: Negative for acute fracture or dislocation.  No evidence for soft tissue swelling in the neck.  Lung apices are clear. Normal alignment of the cervical spine, including the cervicothoracic junction.  IMPRESSION: No acute bony abnormality in the cervical spine.  Original Report Authenticated By: Richarda Overlie, M.D.      Medications: I have reviewed the patient's current medications.  Impression: 1. CVA affecting the right brain. 2. Hypertension. 3. Ongoing tobacco abuse.      Plan: 1. Await MRI brain, which will be done tomorrow. Continue with aspirin 325 mg daily. Monitor blood pressure. 2. If he remains stable, he can be discharged home tomorrow with outpatient followup.     LOS: 1 day   Wilson Singer Pager [phone]  02/13/2012, 10:18 AM

## 2012-02-14 ENCOUNTER — Inpatient Hospital Stay (HOSPITAL_COMMUNITY): Payer: Medicaid Other

## 2012-02-14 LAB — COMPREHENSIVE METABOLIC PANEL
AST: 14 U/L (ref 0–37)
Albumin: 3.3 g/dL — ABNORMAL LOW (ref 3.5–5.2)
BUN: 6 mg/dL (ref 6–23)
Calcium: 9.1 mg/dL (ref 8.4–10.5)
Chloride: 104 mEq/L (ref 96–112)
Creatinine, Ser: 1.27 mg/dL (ref 0.50–1.35)
GFR calc non Af Amer: 62 mL/min — ABNORMAL LOW (ref >90)
Total Bilirubin: 0.6 mg/dL (ref 0.3–1.2)

## 2012-02-14 MED ORDER — ASPIRIN 325 MG PO TABS: 325.0000 mg | ORAL_TABLET | Freq: Every day | ORAL | Status: AC

## 2012-02-14 MED ORDER — HYDROCHLOROTHIAZIDE 25 MG PO TABS: 12.5000 mg | ORAL_TABLET | Freq: Every day | ORAL | Status: DC

## 2012-02-14 MED ORDER — NICOTINE 14 MG/24HR TD PT24: 1.0000 | MEDICATED_PATCH | Freq: Every day | TRANSDERMAL | Status: AC

## 2012-02-14 MED ORDER — LISINOPRIL 2.5 MG PO TABS: 2.5000 mg | ORAL_TABLET | Freq: Every day | ORAL | Status: DC

## 2012-02-14 NOTE — Discharge Summary (Signed)
Physician Discharge Summary  Patient ID: REAL CONA MRN: [id number] DOB/AGE: [birth year] 56 y.o.  Admit date: 02/12/2012 Discharge date: 02/14/2012    Discharge Diagnoses:  1. Acute right hemisphere white matter lacunar CVA. 2. Hypertension, uncontrolled. 3. Tobacco abuse.   Medication List  As of 02/14/2012  9:46 AM   STOP taking these medications         UNKNOWN TO PATIENT         TAKE these medications         aspirin 325 MG tablet   Take 1 tablet (325 mg total) by mouth daily.      hydrochlorothiazide 25 MG tablet   Commonly known as: HYDRODIURIL   Take 0.5 tablets (12.5 mg total) by mouth daily.      lisinopril 2.5 MG tablet   Commonly known as: PRINIVIL,ZESTRIL   Take 1 tablet (2.5 mg total) by mouth daily.      nicotine 14 mg/24hr patch   Commonly known as: NICODERM CQ - dosed in mg/24 hours   Place 1 patch onto the skin daily.            Discharged Condition: Stable and improved.    Consults: None.  Significant Diagnostic Studies: Dg Chest 2 View  02/13/2012  *RADIOLOGY REPORT*  Clinical Data: Left arm numbness.  Stroke.  CHEST - 2 VIEW  Comparison: 02/13/2012  Findings: Lateral view degraded by patient arm position.  Numerous leads and wires project over the chest.  Midline trachea. Normal heart size and mediastinal contours. No pleural effusion or pneumothorax.  Diffuse peribronchial thickening.  Clear lungs.  IMPRESSION: 1.  No acute cardiopulmonary disease. 2.  Mild peribronchial thickening which may relate to chronic bronchitis or smoking.  Original Report Authenticated By: Consuello Bossier, M.D.   Ct Head Wo Contrast  02/12/2012  *RADIOLOGY REPORT*  Clinical Data:  History of left hand numbness, hypertension and blood thinners.  CT HEAD WITHOUT CONTRAST CT CERVICAL SPINE WITHOUT CONTRAST  Technique:  Multidetector CT imaging of the head and cervical spine was performed following the standard protocol without intravenous contrast.  Multiplanar CT image  reconstructions of the cervical spine were also generated.  Comparison:   None  CT HEAD  Findings:  There is low density in the right white matter tract and the right internal capsule.  Findings are most compatible with prior ischemic changes.  Difficult to exclude an acute component.  There is deformity of the left temporal bone and left lower frontal bone suggesting an old fracture.  This low density along the periphery of the left frontal and temporal lobes that may be artifactual.  There is no evidence to suggest acute hemorrhage.  No evidence for a mass lesion, midline shift or hydrocephalus.  Mild mucosal disease in the right ethmoid air cells and right frontal sinus.  Deformity of the bilateral nasal bones.  IMPRESSION: Evidence for old ischemic changes in the right white matter tract and right internal capsule.  An acute or subacute component in this area cannot be excluded and could be further evaluated with MRI.  Deformity along the left side of the skull is suggestive for an old injury.  There is also evidence of old nasal bone fractures.  No evidence for acute hemorrhage.  CT CERVICAL SPINE  Findings: Negative for acute fracture or dislocation.  No evidence for soft tissue swelling in the neck.  Lung apices are clear. Normal alignment of the cervical spine, including the cervicothoracic junction.  IMPRESSION: No acute  bony abnormality in the cervical spine.  Original Report Authenticated By: Richarda Overlie, M.D.   Ct Cervical Spine Wo Contrast  02/12/2012  *RADIOLOGY REPORT*  Clinical Data:  History of left hand numbness, hypertension and blood thinners.  CT HEAD WITHOUT CONTRAST CT CERVICAL SPINE WITHOUT CONTRAST  Technique:  Multidetector CT imaging of the head and cervical spine was performed following the standard protocol without intravenous contrast.  Multiplanar CT image reconstructions of the cervical spine were also generated.  Comparison:   None  CT HEAD  Findings:  There is low density in the  right white matter tract and the right internal capsule.  Findings are most compatible with prior ischemic changes.  Difficult to exclude an acute component.  There is deformity of the left temporal bone and left lower frontal bone suggesting an old fracture.  This low density along the periphery of the left frontal and temporal lobes that may be artifactual.  There is no evidence to suggest acute hemorrhage.  No evidence for a mass lesion, midline shift or hydrocephalus.  Mild mucosal disease in the right ethmoid air cells and right frontal sinus.  Deformity of the bilateral nasal bones.  IMPRESSION: Evidence for old ischemic changes in the right white matter tract and right internal capsule.  An acute or subacute component in this area cannot be excluded and could be further evaluated with MRI.  Deformity along the left side of the skull is suggestive for an old injury.  There is also evidence of old nasal bone fractures.  No evidence for acute hemorrhage.  CT CERVICAL SPINE  Findings: Negative for acute fracture or dislocation.  No evidence for soft tissue swelling in the neck.  Lung apices are clear. Normal alignment of the cervical spine, including the cervicothoracic junction.  IMPRESSION: No acute bony abnormality in the cervical spine.  Original Report Authenticated By: Richarda Overlie, M.D.   Mr Maxine Glenn Head Wo Contrast  02/14/2012  *RADIOLOGY REPORT*  Clinical Data: 56 year old male with left upper extremity weakness and numbness.  Acute infarct.  MRA HEAD WITHOUT CONTRAST  Technique: Angiographic images of the Circle of Willis were obtained using MRA technique without intravenous contrast.  Comparison: Brain MRI 02/14/2012.  Findings: Antegrade flow in the posterior circulation.  Codominant distal vertebral arteries are patent.  Normal left PICA and AICA. Dominant right AICA.  Dolichoectatic basilar artery without stenosis.  Normal superior cerebellar artery origins.  Normal PCA origins.  Posterior communicating  arteries are diminutive or absent.  Mild bilateral PCA dolichoectasia.  No PCA stenosis.  Antegrade flow in both ICA siphons.  Mild ICA dolichoectasia.  No ICA stenosis.  Normal ophthalmic artery origins.  Normal carotid termini, MCA and ACA origins.  Anterior communicating artery is diminutive or absent.  Negative visualized ACA branches except for tortuosity.  MCA M1 segment are tortuous but otherwise within normal limits. MCA bifurcations are patent.  Visualized right MCA branches are within normal limits.  Visualized left MCA branches are within normal limits.  IMPRESSION: Negative except for intracranial artery dolichoectasia.  Original Report Authenticated By: Harley Hallmark, M.D.   Mr Brain Wo Contrast  02/14/2012  *RADIOLOGY REPORT*  Clinical Data: 56 year old male with left hand weakness and numbness.  Abnormal head CT.  MRI HEAD WITHOUT CONTRAST  Technique:  Multiplanar, multiecho pulse sequences of the brain and surrounding structures were obtained according to standard protocol without intravenous contrast.  Comparison: Head CT without contrast 02/12/2012.  Findings: Chronic left lateral skull deformity, with bone detail  best depicted on comparison.  Chronic right corona radiata and basal ganglia infarct with gliosis.  Superimposed acute posterior corona radiata/centrum semiovale bowel white matter infarct with restricted diffusion (series 4 image 16) and mild T2 and FLAIR hyperintensity.  No mass effect or hemorrhage.  No other restricted diffusion. No midline shift, ventriculomegaly, mass effect, evidence of mass lesion, extra-axial collection or acute intracranial hemorrhage.  Cervicomedullary junction and pituitary are within normal limits.  Major intracranial vascular flow voids are preserved; there is a degree of intracranial artery dolichoectasia.  Additional scattered cerebral white matter T2 and FLAIR hyperintensity beyond that described earlier, and mostly periventricular.  Mild to moderate  for age brain stem white matter T2 and FLAIR hyperintensity.  Negative cerebellum and visualized cervical spine. Visualized bone marrow signal is within normal limits.  Mild paranasal sinus mucosal thickening.  Minimal fluid signal in the right mastoids.  Negative visualized nasopharynx.  Small left periauricular dermal associated cyst.  Negative scalp soft tissues otherwise. Visualized orbit soft tissues are within normal limits.  IMPRESSION: 1.  Acute right hemisphere white matter lacunar infarct.  No mass effect or hemorrhage. 2.  Underlying severe for age chronic cerebral white matter and right basal ganglia ischemia. 3.  Intracranial artery dolichoectasia.  Original Report Authenticated By: Harley Hallmark, M.D.   US Carotid Duplex Bilateral  02/13/2012  *RADIOLOGY REPORT*  Clinical Data: Stroke.  Diabetes, tobacco use, hypertension.  BILATERAL CAROTID DUPLEX ULTRASOUND  Technique: Wallace Cullens scale imaging, color Doppler and duplex ultrasound was performed of bilateral carotid and vertebral arteries in the neck.  Comparison: None  Criteria:  Quantification of carotid stenosis is based on velocity parameters that correlate the residual internal carotid diameter with NASCET-based stenosis levels, using the diameter of the distal internal carotid lumen as the denominator for stenosis measurement.  The following velocity measurements were obtained:                   PEAK SYSTOLIC/END DIASTOLIC RIGHT ICA:                        51/13cm/sec CCA:                        70/14cm/sec SYSTOLIC ICA/CCA RATIO:     0.73 DIASTOLIC ICA/CCA RATIO:    0.95 ECA:                        46cm/sec  LEFT ICA:                        45/12cm/sec CCA:                        81/15cm/sec SYSTOLIC ICA/CCA RATIO:     0.56 DIASTOLIC ICA/CCA RATIO:    0.78 ECA:                        62cm/sec  Findings:  RIGHT CAROTID ARTERY: There is intimal thickening through the common carotid artery and bulb without focal plaque accumulation or stenosis.   Normal wave forms and color Doppler signal.  RIGHT VERTEBRAL ARTERY:  Normal flow direction and waveform.  LEFT CAROTID ARTERY: Mild intimal thickening in the common carotid artery.  Smooth noncalcified plaque partially effaces the carotid bulb, extending to the origin of the ICA without high-grade stenosis.  Normal wave forms and color  Doppler signal.  LEFT VERTEBRAL ARTERY:  Normal flow direction and waveform.  IMPRESSION:  1.  Early left carotid bifurcation and proximal ICA plaque, resulting in less than 50% diameter stenosis. The exam does not exclude plaque ulceration or embolization.  Continued surveillance recommended.  Original Report Authenticated By: Osa Craver, M.D.    Lab Results: Basic Metabolic Panel:  Basename 02/14/12 0511 02/13/12 0432  NA 140 135  K 3.5 3.5  CL 104 103  CO2 29 27  GLUCOSE 99 100*  BUN 6 7  CREATININE 1.27 1.15  CALCIUM 9.1 8.8  MG -- 2.1  PHOS -- --   Liver Function Tests:  [facility] 02/14/12 0511 02/13/12 0432  AST 14 16  ALT 7 8  ALKPHOS 54 54  BILITOT 0.6 0.5  PROT 6.8 6.5  ALBUMIN 3.3* 3.1*     CBC:  Basename 02/13/12 0432 02/12/12 2003  WBC 5.0 5.6  NEUTROABS -- 2.0  HGB 13.9 15.0  HCT 41.0 43.9  MCV 96.5 95.9  PLT 310 322       Hospital Course: This 56 year old man was admitted with symptoms of left index finger and middle finger numbness and weakness. He did not have any arm or leg weakness. He was noted to have a left facial upper motor neuron palsy and also in fact on objective testing did have weakness in the left arm, strength 4/5. The left leg was normal. Since he has been in the hospital his symptoms are improved and the strength in his left arm and hand are improving. He never had any speech difficulties. MRI brain showed right acute hemispheric white matter lacunar CVA. There is no stenosis in the neck internal carotid arteries. He did not have an echocardiogram. His lipid panel was unremarkable. Physical therapy  has seen him and recommended some exercises for strengthening.  Discharge Exam: Blood pressure 158/92, pulse 70, temperature 98.1 F (36.7 C), temperature source Oral, resp. rate 20, height 6\' 1"  (1.854 m), weight 98.7 kg (217 lb 9.5 oz), SpO2 94.00%. He looks systemically well today. Heart sounds are present and normal. Lung fields are clear. He still has a slight left upper motor neuron facial palsy and his left arm strength has improved.  Disposition: Home. He'll follow with his primary care physician. I have added lisinopril 2.5 mg at night in addition to his HCTZ. He has been encouraged to quit smoking.  Discharge Orders    Future Orders Please Complete By Expires   Diet - low sodium heart healthy      Increase activity slowly           SignedWilson Singer Pager [phone]  02/14/2012, 9:46 AM

## 2012-02-14 NOTE — Progress Notes (Signed)
Pt discharged home via family; Pt and family given and explained all discharge instructions, carenotes, and prescriptions; pt and family stated understanding and denied questions/concerns; all f/u appointments in place; IV removed without complicaitons; pt stable at time of discharge ; pt able to demonstrate movements for hand and arms; pt able to verbalize importance for taking b/p medications

## 2012-08-17 ENCOUNTER — Other Ambulatory Visit: Payer: Self-pay | Admitting: Internal Medicine

## 2016-07-16 DIAGNOSIS — G8190 Hemiplegia, unspecified affecting unspecified side: Secondary | ICD-10-CM | POA: Diagnosis not present

## 2016-07-16 DIAGNOSIS — I10 Essential (primary) hypertension: Secondary | ICD-10-CM | POA: Diagnosis not present

## 2016-07-16 DIAGNOSIS — R739 Hyperglycemia, unspecified: Secondary | ICD-10-CM | POA: Diagnosis not present

## 2016-07-16 DIAGNOSIS — E785 Hyperlipidemia, unspecified: Secondary | ICD-10-CM | POA: Diagnosis not present

## 2016-07-16 DIAGNOSIS — F4310 Post-traumatic stress disorder, unspecified: Secondary | ICD-10-CM | POA: Diagnosis not present

## 2016-07-16 DIAGNOSIS — F17200 Nicotine dependence, unspecified, uncomplicated: Secondary | ICD-10-CM | POA: Diagnosis not present

## 2016-07-20 DIAGNOSIS — I10 Essential (primary) hypertension: Secondary | ICD-10-CM | POA: Diagnosis not present

## 2016-07-20 DIAGNOSIS — F17200 Nicotine dependence, unspecified, uncomplicated: Secondary | ICD-10-CM | POA: Diagnosis not present

## 2016-07-20 DIAGNOSIS — G8190 Hemiplegia, unspecified affecting unspecified side: Secondary | ICD-10-CM | POA: Diagnosis not present

## 2016-07-20 DIAGNOSIS — N182 Chronic kidney disease, stage 2 (mild): Secondary | ICD-10-CM | POA: Diagnosis not present

## 2017-12-26 DIAGNOSIS — I10 Essential (primary) hypertension: Secondary | ICD-10-CM | POA: Diagnosis not present

## 2017-12-26 DIAGNOSIS — R739 Hyperglycemia, unspecified: Secondary | ICD-10-CM | POA: Diagnosis not present

## 2017-12-26 DIAGNOSIS — R300 Dysuria: Secondary | ICD-10-CM | POA: Diagnosis not present

## 2017-12-26 DIAGNOSIS — G8190 Hemiplegia, unspecified affecting unspecified side: Secondary | ICD-10-CM | POA: Diagnosis not present

## 2017-12-26 DIAGNOSIS — I6350 Cerebral infarction due to unspecified occlusion or stenosis of unspecified cerebral artery: Secondary | ICD-10-CM | POA: Diagnosis not present

## 2017-12-26 DIAGNOSIS — F17210 Nicotine dependence, cigarettes, uncomplicated: Secondary | ICD-10-CM | POA: Diagnosis not present

## 2017-12-26 DIAGNOSIS — C61 Malignant neoplasm of prostate: Secondary | ICD-10-CM | POA: Diagnosis not present

## 2017-12-26 DIAGNOSIS — N390 Urinary tract infection, site not specified: Secondary | ICD-10-CM | POA: Diagnosis not present

## 2017-12-26 DIAGNOSIS — E785 Hyperlipidemia, unspecified: Secondary | ICD-10-CM | POA: Diagnosis not present

## 2017-12-26 DIAGNOSIS — Z0000 Encounter for general adult medical examination without abnormal findings: Secondary | ICD-10-CM | POA: Diagnosis not present

## 2017-12-26 DIAGNOSIS — Z1389 Encounter for screening for other disorder: Secondary | ICD-10-CM | POA: Diagnosis not present

## 2017-12-26 DIAGNOSIS — F17200 Nicotine dependence, unspecified, uncomplicated: Secondary | ICD-10-CM | POA: Diagnosis not present

## 2018-01-09 DIAGNOSIS — I6350 Cerebral infarction due to unspecified occlusion or stenosis of unspecified cerebral artery: Secondary | ICD-10-CM | POA: Diagnosis not present

## 2018-01-09 DIAGNOSIS — I10 Essential (primary) hypertension: Secondary | ICD-10-CM | POA: Diagnosis not present

## 2018-01-09 DIAGNOSIS — F17200 Nicotine dependence, unspecified, uncomplicated: Secondary | ICD-10-CM | POA: Diagnosis not present

## 2018-01-09 DIAGNOSIS — G8190 Hemiplegia, unspecified affecting unspecified side: Secondary | ICD-10-CM | POA: Diagnosis not present

## 2018-02-23 DIAGNOSIS — I10 Essential (primary) hypertension: Secondary | ICD-10-CM | POA: Diagnosis not present

## 2018-02-23 DIAGNOSIS — F17210 Nicotine dependence, cigarettes, uncomplicated: Secondary | ICD-10-CM | POA: Diagnosis not present

## 2018-02-23 DIAGNOSIS — G8190 Hemiplegia, unspecified affecting unspecified side: Secondary | ICD-10-CM | POA: Diagnosis not present

## 2018-02-23 DIAGNOSIS — N182 Chronic kidney disease, stage 2 (mild): Secondary | ICD-10-CM | POA: Diagnosis not present

## 2018-02-23 DIAGNOSIS — F17200 Nicotine dependence, unspecified, uncomplicated: Secondary | ICD-10-CM | POA: Diagnosis not present

## 2018-03-08 ENCOUNTER — Other Ambulatory Visit: Payer: Self-pay

## 2018-03-08 ENCOUNTER — Encounter: Payer: Self-pay | Admitting: Family Medicine

## 2018-03-08 ENCOUNTER — Ambulatory Visit (HOSPITAL_COMMUNITY)
Admission: RE | Admit: 2018-03-08 | Discharge: 2018-03-08 | Disposition: A | Discharge status: Home / Self Care | Payer: Medicare HMO | Source: Ambulatory Visit | Attending: Family Medicine | Admitting: Family Medicine

## 2018-03-08 ENCOUNTER — Ambulatory Visit (INDEPENDENT_AMBULATORY_CARE_PROVIDER_SITE_OTHER): Payer: Medicare HMO | Admitting: Family Medicine

## 2018-03-08 VITALS — BP 156/92 | HR 79 | Temp 97.4°F | Resp 16 | Ht 74.0 in | Wt 273.8 lb

## 2018-03-08 DIAGNOSIS — Z23 Encounter for immunization: Secondary | ICD-10-CM | POA: Diagnosis not present

## 2018-03-08 DIAGNOSIS — Z9089 Acquired absence of other organs: Secondary | ICD-10-CM | POA: Diagnosis not present

## 2018-03-08 DIAGNOSIS — N189 Chronic kidney disease, unspecified: Secondary | ICD-10-CM

## 2018-03-08 DIAGNOSIS — R5383 Other fatigue: Secondary | ICD-10-CM

## 2018-03-08 DIAGNOSIS — R739 Hyperglycemia, unspecified: Secondary | ICD-10-CM | POA: Diagnosis not present

## 2018-03-08 DIAGNOSIS — I10 Essential (primary) hypertension: Secondary | ICD-10-CM | POA: Diagnosis not present

## 2018-03-08 DIAGNOSIS — Z9119 Patient's noncompliance with other medical treatment and regimen: Secondary | ICD-10-CM

## 2018-03-08 DIAGNOSIS — M5432 Sciatica, left side: Secondary | ICD-10-CM | POA: Insufficient documentation

## 2018-03-08 DIAGNOSIS — Z91199 Patient's noncompliance with other medical treatment and regimen due to unspecified reason: Secondary | ICD-10-CM

## 2018-03-08 DIAGNOSIS — I639 Cerebral infarction, unspecified: Secondary | ICD-10-CM

## 2018-03-08 DIAGNOSIS — E785 Hyperlipidemia, unspecified: Secondary | ICD-10-CM | POA: Diagnosis not present

## 2018-03-08 DIAGNOSIS — M5136 Other intervertebral disc degeneration, lumbar region: Secondary | ICD-10-CM | POA: Diagnosis not present

## 2018-03-08 MED ORDER — MELOXICAM 7.5 MG PO TABS
7.5000 mg | ORAL_TABLET | Freq: Every day | ORAL | 0 refills | Status: DC
Start: 2018-03-08 — End: 2018-05-05

## 2018-03-08 MED ORDER — KETOROLAC TROMETHAMINE 60 MG/2ML IM SOLN
60.0000 mg | Freq: Once | INTRAMUSCULAR | Status: AC
  Administered 2018-03-08: 60 mg via INTRAMUSCULAR

## 2018-03-08 MED ORDER — ASPIRIN EC 81 MG PO TBEC
81.0000 mg | DELAYED_RELEASE_TABLET | Freq: Every day | ORAL | Status: DC
Start: 2018-03-08 — End: 2023-04-27

## 2018-03-08 NOTE — Progress Notes (Signed)
Patient ID: Derrick Gaines, male    DOB: 1956-12-21, 62 y.o.   MRN: [id number]  Chief Complaint  Patient presents with  . Leg Pain    pain for months, 8/10    Allergies Patient has no known allergies.  Subjective:   Derrick Gaines is a 62 y.o. male who presents to [facility] today.  HPI Derrick Gaines is a 62 year old male with a previous history of ischemic CVA who has been followed by Dr. Legrand Rams.  He presents today to establish care as a new patient.  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  He reports that he had surgery for cancer within the past couple years at the [place].  He did not like having to drive so far for these appointments and elected not to proceed with the recommended 30 treatments with the radiation oncologist.  Neither him nor his wife are able to tell me what type of cancer that he had.  They brought a folder with some paperwork.  The paperwork reports that he had his thymus removed and sternotomy performed, they are not sure of the dates.  His wife reports that he has a history of a stroke, stage II chronic kidney disease, hypertension, and high cholesterol.  She reports that he does not take his medication.  He tells me that he did take his medication today just because he was coming to the doctor but he does not routinely take any medications.  He is supposed to be taking an aspirin for secondary prevention of stroke but does not take that medication either.  He reports that the main reason he is here today is because he has had pain in his left gluteal region for the past 2 months.  He was seen by his previous PCP for this pain and placed on gabapentin and Motrin.  He reports he has been taking this medication sporadically but is not helped him.  He reports that if he does take the pain medicine that the pain will decrease but then it comes back.  He reports that he does have to limp because it hurts to walk at  times with the pain.  He reports that the pain is sharp in quality and radiates down his left leg.  He reports it is an 8 out of 10.  He denies any burning, numbness or electrical type pain.  He reports the pain is to sharp in quality.  He reports nothing makes this better other than taking the pain pills.  He reports that activity seems to make the pain worse.  He reports that because of the pain he is not been active over the past several months.  He reports that the pain does not always bother him every day but then may bother him for several days in a row.  He tells me that he came to seek evaluation because his previous doctor only gave him some pain meds and did not do any evaluation for his pain.  He reports he has not had any x-rays or evaluation.  He has been taking Motrin prescription strength and using Goody's powders to deal with this pain.  He denies any chest pain, shortness of breath, or swelling with his extremities.  Today's the first day is taken his blood pressure medication in many weeks.  He took it right before coming to our office.  He reports that  he really does not like to take medications or go to the doctor.  He does report that he likes to live and does not want to die.  He reports he did not have a radiation oncology treatments for thymoma because it was too far for him to drive and he believes that the cancer was gone.  He reports that several months ago he had a CT scan performed at the [facility] which was normal.  He does give a history of depression in the past.  He was placed on Zoloft but is not taking this medication in the long time.  He reports he does not feel depressed.  His energy is fair.  He sleeps pretty good.  Appetite is good.  He has not had any recent weight loss.  The pain that occurs in his leg does not wake him up for sleep.  He denies any night sweats.      Past Medical History:  Diagnosis Date  . Arthritis   . Cancer ([place])   . Chronic kidney  disease   . Hypertension   . Stroke [facility])     Past Surgical History:  Procedure Laterality Date  . BACK SURGERY    . STERNOTOMY    . THYMECTOMY      Family History  Problem Relation Age of Onset  . Diabetes Mother   . Cancer Mother   . Colon cancer Sister   . Pneumonia Father   . Emphysema Brother      Social History   Socioeconomic History  . Marital status: Married    Spouse name: None  . Number of children: None  . Years of education: None  . Highest education level: None  Social Needs  . Financial resource strain: None  . Food insecurity - worry: None  . Food insecurity - inability: None  . Transportation needs - medical: None  . Transportation needs - non-medical: None  Occupational History  . None  Tobacco Use  . Smoking status: Current Every Day Smoker    Packs/day: 0.50    Years: 30.00    Pack years: 15.00    Types: Cigarettes  . Smokeless tobacco: Never Used  Substance and Sexual Activity  . Alcohol use: No  . Drug use: No  . Sexual activity: Yes  Other Topics Concern  . None  Social History Narrative   Been on disability since age 12 due to a stroke.    Used to work at [employer].    Married. No children. Married for 15 years.   Smoke cigarettes, since age 63 years.    No alcohol. No drugs.   Enjoys fishing.    Attends church.   Eats all foods.   Wear seatbelt.    Drives.    Current Outpatient Medications on File Prior to Visit  Medication Sig Dispense Refill  . amLODipine (NORVASC) 10 MG tablet Take 10 mg by mouth daily.    . CHANTIX STARTING MONTH PAK 0.5 MG X 11 & 1 MG X 42 tablet     . omeprazole (PRILOSEC) 20 MG capsule Take 20 mg by mouth daily.     No current facility-administered medications on file prior to visit.     Review of Systems  Constitutional: Negative for activity change, appetite change, chills, fatigue, fever and unexpected weight change.  HENT: Negative for dental problem, rhinorrhea, sinus pressure, sore throat,  trouble swallowing and voice change.        Patient reports that his  voice is always sounded the way it does today.  Eyes: Negative for visual disturbance.  Respiratory: Negative for cough, chest tightness, shortness of breath and wheezing.   Cardiovascular: Negative for chest pain, palpitations and leg swelling.  Gastrointestinal: Negative for abdominal pain, diarrhea, nausea and vomiting.  Genitourinary: Negative for decreased urine volume, dysuria and frequency.  Musculoskeletal: Negative for arthralgias, gait problem, joint swelling and myalgias.       Pain in left gluteal region that radiates down his leg.  Skin: Negative for rash.  Neurological: Negative for dizziness, tremors, syncope, facial asymmetry, weakness, light-headedness, numbness and headaches.       He denies any memory disturbance or residual problems from his stroke other than some very mild weakness in his upper extremity.  Hematological: Negative for adenopathy. Does not bruise/bleed easily.  Psychiatric/Behavioral: Negative for agitation, behavioral problems, dysphoric mood and sleep disturbance.     Objective:   BP (!) 156/92 (BP Location: Left Arm, Patient Position: Sitting, Cuff Size: Normal)   Pulse 79   Temp (!) 97.4 F (36.3 C) (Temporal)   Resp 16   Ht 6\' 2"  (1.88 m)   Wt 273 lb 12 oz (124.2 kg)   SpO2 95%   BMI 35.15 kg/m   Physical Exam  Constitutional: He is oriented to person, place, and time. He appears well-developed and well-nourished.  Well-developed, well-nourished male in no acute distress.  Appearance consistent with his stated age.  Very raspy and hoarse voice which is somewhat difficult to understand at times.  Questionable cognitive deficit. Throughout encounter patient and his wife argue and bicker back and forth.  HENT:  Head: Normocephalic and atraumatic.  Mouth/Throat: No oropharyngeal exudate.  Eyes: Conjunctivae and EOM are normal. Pupils are equal, round, and reactive to light.    Neck: Normal range of motion. Neck supple. No thyromegaly present.  Cardiovascular: Normal rate, regular rhythm and normal heart sounds.  Pulmonary/Chest: Effort normal and breath sounds normal.  Abdominal: Soft. Bowel sounds are normal. He exhibits no distension. There is no tenderness.  Musculoskeletal: He exhibits no edema.       Lumbar back: He exhibits normal range of motion, no tenderness, no bony tenderness, no swelling and no edema.  Left gluteal tenderness to palpation.  Palpation and gluteal region with reproduction of pain down his left leg.  Muscle strength in upper and lower extremities 5 out of 5 throughout.  Sensation intact in upper and lower extremities.  Negative straight leg raise.  Neurological: He is alert and oriented to person, place, and time. No cranial nerve deficit. Coordination normal.  Skin: Skin is warm, dry and intact.  Psychiatric: He has a normal mood and affect. His behavior is normal. Judgment and thought content normal.  Vitals reviewed.    Assessment and Plan  1. HTN, goal below 140/90 Blood pressure uncontrolled.  Discussed with patient that uncontrolled hypertension increases his risk of heart attack and stroke.  It was recommended that he take the amlodipine but at a dose of 10 mg a day. Lifestyle modifications discussed with patient including a diet emphasizing vegetables, fruits, and whole grains. Limiting intake of sodium to less than 2,400 mg per day.  Recommendations discussed include consuming low-fat dairy products, poultry, fish, legumes, non-tropical vegetable oils, and nuts; and limiting intake of sweets, sugar-sweetened beverages, and red meat. Discussed following a plan such as the Dietary Approaches to Stop Hypertension (DASH) diet. Patient to read up on this diet.   - COMPLETE METABOLIC PANEL WITH  GFR  2. Cerebrovascular accident (CVA), unspecified mechanism ([place]) Compliance with aspirin secondary to his history of CVA was recommended.   It was also recommended that he DC use of Goody Powder. - aspirin EC 81 MG tablet; Take 1 tablet (81 mg total) by mouth daily.  3. Hyperlipidemia LDL goal <70 Check FLP today.  He reportedly has a history of hyperlipidemia and is also had a stroke but has not been on statin. - Lipid panel  4. Fatigue, unspecified type Patient reports that he is occasionally fatigued. - CBC with Differential/Platelet - TSH  5. Hyperglycemia History of hyperglycemia but no diagnosis of diabetes.  He definitely has risk factors.  Check A1c today. - Hemoglobin A1c  6. Chronic renal impairment, unspecified CKD stage Patient has not been evaluated by nephrologist per his report.  Check screening today for creatinine. - COMPLETE METABOLIC PANEL WITH GFR  7. Sciatica of left side Discussed sciatica with patient today.  Will obtain x-ray.  Patient defers physical therapy at this time.  Exercises, ice, or heat discussed with him.  He would like to try the pain medication and home exercise and then follow-up if symptoms are not improved.  He was given a Toradol injection today in the office.  We did discuss risk factors of NSAIDs in the office today. Discussed risks of cardiovascular thrombotic events related to NSAIDS. Discussed increased risk of AMI and CVA. Discussed risk of serious GI adverse events including bleeding, ulcers, and perforation. Patient understands risks of this medication.   - DG Lumbar Spine Complete; Future - ketorolac (TORADOL) injection 60 mg - meloxicam (MOBIC) 7.5 MG tablet; Take 1 tablet (7.5 mg total) by mouth daily.  Dispense: 30 tablet; Refill: 0  8. H/O thymectomy/Thymoma Patient reports that he did not complete the radiation oncology recommendations due to the fact that he did not like the driving all the way to the [place] clinic.  He is agreeable to seeing a hematologist locally.  Will request records from the [place].  Place referral to hematology at this time. - Ambulatory referral to  Hematology / Oncology  Patient does have a history of medical noncompliance.  He reports that he does not want to die and he wants to live but he also does not seem to comprehend that uncontrolled blood pressure and untreated malignancy can increase your risk of death and disability.  We discussed this today.  We will request his records from his previous PCP.  He will follow-up in 1 month or sooner if needed.  He was counseled regarding worrisome signs and symptoms of low back pain.  If those occur he will call or return to clinic, or go to the emergency department. Return in about 4 weeks (around 04/05/2018) for BP. Caren Macadam, MD 03/08/2018

## 2018-03-08 NOTE — Patient Instructions (Signed)
Sciatica Sciatica is pain, numbness, weakness, or tingling along your sciatic nerve. The sciatic nerve starts in the lower back and goes down the back of each leg. Sciatica happens when this nerve is pinched or has pressure put on it. Sciatica usually goes away on its own or with treatment. Sometimes, sciatica may keep coming back (recur). Follow these instructions at home: Medicines  Take over-the-counter and prescription medicines only as told by your doctor.  Do not drive or use heavy machinery while taking prescription pain medicine. Managing pain  If directed, put ice on the affected area. ? Put ice in a plastic bag. ? Place a towel between your skin and the bag. ? Leave the ice on for 20 minutes, 2-3 times a day.  After icing, apply heat to the affected area before you exercise or as often as told by your doctor. Use the heat source that your doctor tells you to use, such as a moist heat pack or a heating pad. ? Place a towel between your skin and the heat source. ? Leave the heat on for 20-30 minutes. ? Remove the heat if your skin turns bright red. This is especially important if you are unable to feel pain, heat, or cold. You may have a greater risk of getting burned. Activity  Return to your normal activities as told by your doctor. Ask your doctor what activities are safe for you. ? Avoid activities that make your sciatica worse.  Take short rests during the day. Rest in a lying or standing position. This is usually better than sitting to rest. ? When you rest for a long time, do some physical activity or stretching between periods of rest. ? Avoid sitting for a long time without moving. Get up and move around at least one time each hour.  Exercise and stretch regularly, as told by your doctor.  Do not lift anything that is heavier than 10 lb (4.5 kg) while you have symptoms of sciatica. ? Avoid lifting heavy things even when you do not have symptoms. ? Avoid lifting heavy  things over and over.  When you lift objects, always lift in a way that is safe for your body. To do this, you should: ? Bend your knees. ? Keep the object close to your body. ? Avoid twisting. General instructions  Use good posture. ? Avoid leaning forward when you are sitting. ? Avoid hunching over when you are standing.  Stay at a healthy weight.  Wear comfortable shoes that support your feet. Avoid wearing high heels.  Avoid sleeping on a mattress that is too soft or too hard. You might have less pain if you sleep on a mattress that is firm enough to support your back.  Keep all follow-up visits as told by your doctor. This is important. Contact a doctor if:  You have pain that: ? Wakes you up when you are sleeping. ? Gets worse when you lie down. ? Is worse than the pain you have had in the past. ? Lasts longer than 4 weeks.  You lose weight for without trying. Get help right away if:  You cannot control when you pee (urinate) or poop (have a bowel movement).  You have weakness in any of these areas and it gets worse. ? Lower back. ? Lower belly (pelvis). ? Butt (buttocks). ? Legs.  You have redness or swelling of your back.  You have a burning feeling when you pee. This information is not intended to replace   advice given to you by your health care provider. Make sure you discuss any questions you have with your health care provider. Document Released: 09/21/2008 Document Revised: 05/20/2016 Document Reviewed: 08/22/2015 Elsevier Interactive Patient Education  2018 [employer]  

## 2018-03-09 LAB — CBC WITH DIFFERENTIAL/PLATELET
BASOS ABS: 50 {cells}/uL (ref 0–200)
BASOS PCT: 0.9 %
EOS ABS: 118 {cells}/uL (ref 15–500)
Eosinophils Relative: 2.1 %
HCT: 43.4 % (ref 38.5–50.0)
HEMOGLOBIN: 15.2 g/dL (ref 13.2–17.1)
Lymphs Abs: 2022 cells/uL (ref 850–3900)
MCH: 32.8 pg (ref 27.0–33.0)
MCHC: 35 g/dL (ref 32.0–36.0)
MCV: 93.5 fL (ref 80.0–100.0)
MONOS PCT: 9.3 %
MPV: 9.5 fL (ref 7.5–12.5)
NEUTROS ABS: 2890 {cells}/uL (ref 1500–7800)
Neutrophils Relative %: 51.6 %
Platelets: 325 10*3/uL (ref 140–400)
RBC: 4.64 10*6/uL (ref 4.20–5.80)
RDW: 11.8 % (ref 11.0–15.0)
Total Lymphocyte: 36.1 %
WBC: 5.6 10*3/uL (ref 3.8–10.8)
WBCMIX: 521 {cells}/uL (ref 200–950)

## 2018-03-09 LAB — LIPID PANEL
CHOL/HDL RATIO: 4.6 (calc) (ref <5.0)
CHOLESTEROL: 157 mg/dL (ref <200)
HDL: 34 mg/dL — AB (ref >40)
LDL CHOLESTEROL (CALC): 96 mg/dL
NON-HDL CHOLESTEROL (CALC): 123 mg/dL (ref <130)
TRIGLYCERIDES: 176 mg/dL — AB (ref <150)

## 2018-03-09 LAB — COMPLETE METABOLIC PANEL WITH GFR
AG RATIO: 1.3 (calc) (ref 1.0–2.5)
ALKALINE PHOSPHATASE (APISO): 63 U/L (ref 40–115)
ALT: 13 U/L (ref 9–46)
AST: 19 U/L (ref 10–35)
Albumin: 3.9 g/dL (ref 3.6–5.1)
BILIRUBIN TOTAL: 0.5 mg/dL (ref 0.2–1.2)
BUN/Creatinine Ratio: 13 (calc) (ref 6–22)
BUN: 20 mg/dL (ref 7–25)
CHLORIDE: 105 mmol/L (ref 98–110)
CO2: 28 mmol/L (ref 20–32)
Calcium: 9.1 mg/dL (ref 8.6–10.3)
Creat: 1.57 mg/dL — ABNORMAL HIGH (ref 0.70–1.25)
GFR, Est African American: 55 mL/min/{1.73_m2} — ABNORMAL LOW (ref >=60)
GFR, Est Non African American: 47 mL/min/{1.73_m2} — ABNORMAL LOW (ref >=60)
GLOBULIN: 3 g/dL (ref 1.9–3.7)
Glucose, Bld: 110 mg/dL — ABNORMAL HIGH (ref 65–99)
POTASSIUM: 4.4 mmol/L (ref 3.5–5.3)
SODIUM: 138 mmol/L (ref 135–146)
Total Protein: 6.9 g/dL (ref 6.1–8.1)

## 2018-03-09 LAB — HEMOGLOBIN A1C
Hgb A1c MFr Bld: 5.5 % of total Hgb (ref <5.7)
Mean Plasma Glucose: 111 (calc)
eAG (mmol/L): 6.2 (calc)

## 2018-03-09 LAB — TSH: TSH: 1.4 m[IU]/L (ref 0.40–4.50)

## 2018-03-10 ENCOUNTER — Encounter: Payer: Self-pay | Admitting: Family Medicine

## 2018-03-25 ENCOUNTER — Encounter: Payer: Self-pay | Admitting: Critical Care Medicine

## 2018-03-30 ENCOUNTER — Ambulatory Visit (HOSPITAL_COMMUNITY): Payer: Medicare HMO | Admitting: Hematology

## 2018-04-07 ENCOUNTER — Inpatient Hospital Stay (HOSPITAL_COMMUNITY): Payer: Medicare HMO | Attending: Hematology | Admitting: Hematology

## 2018-04-07 ENCOUNTER — Encounter (HOSPITAL_COMMUNITY): Payer: Self-pay | Admitting: Hematology

## 2018-04-07 DIAGNOSIS — I129 Hypertensive chronic kidney disease with stage 1 through stage 4 chronic kidney disease, or unspecified chronic kidney disease: Secondary | ICD-10-CM | POA: Diagnosis not present

## 2018-04-07 DIAGNOSIS — R918 Other nonspecific abnormal finding of lung field: Secondary | ICD-10-CM | POA: Diagnosis not present

## 2018-04-07 DIAGNOSIS — Z8673 Personal history of transient ischemic attack (TIA), and cerebral infarction without residual deficits: Secondary | ICD-10-CM | POA: Diagnosis not present

## 2018-04-07 DIAGNOSIS — Z85238 Personal history of other malignant neoplasm of thymus: Secondary | ICD-10-CM | POA: Insufficient documentation

## 2018-04-07 DIAGNOSIS — Z7982 Long term (current) use of aspirin: Secondary | ICD-10-CM | POA: Diagnosis not present

## 2018-04-07 DIAGNOSIS — F17210 Nicotine dependence, cigarettes, uncomplicated: Secondary | ICD-10-CM | POA: Insufficient documentation

## 2018-04-07 DIAGNOSIS — Z79899 Other long term (current) drug therapy: Secondary | ICD-10-CM

## 2018-04-07 DIAGNOSIS — D150 Benign neoplasm of thymus: Secondary | ICD-10-CM

## 2018-04-07 DIAGNOSIS — D4989 Neoplasm of unspecified behavior of other specified sites: Secondary | ICD-10-CM

## 2018-04-07 DIAGNOSIS — E7800 Pure hypercholesterolemia, unspecified: Secondary | ICD-10-CM

## 2018-04-07 DIAGNOSIS — N189 Chronic kidney disease, unspecified: Secondary | ICD-10-CM | POA: Insufficient documentation

## 2018-04-07 NOTE — Progress Notes (Signed)
[facility] NOTE  Patient Care Team: Caren Macadam, MD as PCP - General (Family Medicine)  CHIEF COMPLAINTS/PURPOSE OF CONSULTATION:  Follow-up of thymic carcinoma.  HISTORY OF PRESENTING ILLNESS:  Derrick Gaines 62 y.o. male is seen in consultation today for follow-up of thymic carcinoma.  He had a resection of thymic carcinoma in January 2018 and refused postoperative chemoradiation therapy.  He is feeling well and denies any fevers, night sweats or weight loss.  He smoked cigarettes 1 pack/day for 44 years.  He is now smoking half pack per day since surgery.  He is on Chantix twice daily and misses doses.  His weight has been stable.  He denies any tingling or numbness in the next 2 days.  He denies any headaches or vision changes.  He had mild residual left upper extremity weakness from CVA.  He denies any nausea, vomiting, diarrhea, bowel changes.  Denies any bleeding per rectum or melena.  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  He denies any new onset pains.  He is accompanied by his wife today.  MEDICAL HISTORY:  Past Medical History:  Diagnosis Date  . Arthritis   . Cancer ([place])   . Chronic kidney disease   . Hypertension   . Stroke [facility])     SURGICAL HISTORY: Past Surgical History:  Procedure Laterality Date  . BACK SURGERY    . STERNOTOMY    . THYMECTOMY      SOCIAL HISTORY: Social History   Socioeconomic History  . Marital status: Married    Spouse name: Not on file  . Number of children: Not on file  . Years of education: Not on file  . Highest education level: Not on file  Occupational History  . Not on file  Social Needs  . Financial resource strain: Not on file  . Food insecurity:    Worry: Not on file    Inability: Not on file  . Transportation needs:    Medical: Not on file    Non-medical: Not on file  Tobacco Use  . Smoking status: Current Every Day Smoker    Packs/day: 0.50    Years: 30.00    Pack  years: 15.00    Types: Cigarettes  . Smokeless tobacco: Never Used  Substance and Sexual Activity  . Alcohol use: No  . Drug use: No  . Sexual activity: Yes  Lifestyle  . Physical activity:    Days per week: Not on file    Minutes per session: Not on file  . Stress: Not on file  Relationships  . Social connections:    Talks on phone: Not on file    Gets together: Not on file    Attends religious service: Not on file    Active member of club or organization: Not on file    Attends meetings of clubs or organizations: Not on file    Relationship status: Not on file  . Intimate partner violence:    Fear of current or ex partner: Not on file    Emotionally abused: Not on file    Physically abused: Not on file    Forced sexual activity: Not on file  Other Topics Concern  . Not on file  Social History Narrative   Been on disability since age 66 due to a stroke.    Used to work at [employer].    Married. No children. Married for 15 years.   Smoke cigarettes,  since age 32 years.    No alcohol. No drugs.   Enjoys fishing.    Attends church.   Eats all foods.   Wear seatbelt.    Drives.     FAMILY HISTORY: Family History  Problem Relation Age of Onset  . Diabetes Mother   . Cancer Mother   . Colon cancer Sister   . Pneumonia Father   . Emphysema Brother     ALLERGIES:  has No Known Allergies.  MEDICATIONS:  Current Outpatient Medications  Medication Sig Dispense Refill  . amLODipine (NORVASC) 10 MG tablet Take 10 mg by mouth daily.    Marland Kitchen aspirin EC 81 MG tablet Take 1 tablet (81 mg total) by mouth daily.    . CHANTIX STARTING MONTH PAK 0.5 MG X 11 & 1 MG X 42 tablet     . meloxicam (MOBIC) 7.5 MG tablet Take 1 tablet (7.5 mg total) by mouth daily. 30 tablet 0  . omeprazole (PRILOSEC) 20 MG capsule Take 20 mg by mouth daily.     No current facility-administered medications for this visit.     REVIEW OF SYSTEMS:   Constitutional: Denies fevers, chills or abnormal night  sweats Eyes: Denies blurriness of vision, double vision or watery eyes Ears, nose, mouth, throat, and face: Denies mucositis or sore throat Respiratory: Denies cough, dyspnea or wheezes Cardiovascular: Denies palpitation, chest discomfort or lower extremity swelling Gastrointestinal:  Denies nausea, heartburn or change in bowel habits Skin: Denies abnormal skin rashes Lymphatics: Denies new lymphadenopathy or easy bruising Neurological:Denies numbness, tingling or new weaknesses Behavioral/Psych: Mood is stable, no new changes  All other systems were reviewed with the patient and are negative.  PHYSICAL EXAMINATION: ECOG PERFORMANCE STATUS: 1 - Symptomatic but completely ambulatory  Vitals:   04/07/18 0926  BP: (!) 154/81  Pulse: 85  Resp: 18  Temp: 97.6 F (36.4 C)  SpO2: 98%   Filed Weights   04/07/18 0926  Weight: 271 lb (122.9 kg)    GENERAL:alert, no distress and comfortable SKIN: skin color, texture, turgor are normal, no rashes or significant lesions.  Median sternotomy scar present. EYES: normal, conjunctiva are pink and non-injected, sclera clear OROPHARYNX:no exudate, no erythema and lips, buccal mucosa, and tongue normal  NECK: supple, thyroid normal size, non-tender, without nodularity LYMPH:  no palpable lymphadenopathy in the cervical, axillary or inguinal LUNGS: clear to auscultation and percussion with normal breathing effort HEART: regular rate & rhythm and no murmurs and no lower extremity edema ABDOMEN:abdomen soft, non-tender and normal bowel sounds Musculoskeletal:no cyanosis of digits and no clubbing  PSYCH: alert & oriented x 3 with fluent speech NEURO: no focal motor/sensory deficits  LABORATORY DATA:  I have reviewed the data as listed Lab Results  Component Value Date   WBC 5.6 03/08/2018   HGB 15.2 03/08/2018   HCT 43.4 03/08/2018   MCV 93.5 03/08/2018   PLT 325 03/08/2018     Chemistry      Component Value Date/Time   NA 138  03/08/2018 1200   K 4.4 03/08/2018 1200   CL 105 03/08/2018 1200   CO2 28 03/08/2018 1200   BUN 20 03/08/2018 1200   CREATININE 1.57 (H) 03/08/2018 1200      Component Value Date/Time   CALCIUM 9.1 03/08/2018 1200   ALKPHOS 54 02/14/2012 0511   AST 19 03/08/2018 1200   ALT 13 03/08/2018 1200   BILITOT 0.5 03/08/2018 1200       RADIOGRAPHIC STUDIES: I have obtained and  personally reviewed CT scan and other radiology reports from [facility].  ASSESSMENT & PLAN:  1.  Poorly differentiated thymic carcinoma: -Presentation with weight loss of 40 pounds, PET/CT scan on 10/05/2016 showed anterior mediastinal mass measuring 3.6 x 1.9 cm with SUV of 6.3, status post resection on 12/29/2016 -I have obtained and reviewed the pathology report dated 12/29/2016 which showed poorly differentiated thymic carcinoma, with foci of squamous differentiation, negative margin for lung tissue, negative margin for pericardial and pleura, one lymph node negative, pT2pN0M0, adjuvant chemoradiation therapy was recommended, patient declined.   -Last follow-up at [facility] hospital in December, CT scan of the chest with contrast on 12/14/2017 shows no evidence of recurrence or metastatic disease.  Scattered small pulmonary nodules are unchanged from 09/22/2016.   -Family history with one sister died of colon cancer in her 21s, another sister with metastatic cancer at this time -Patient would like to switch care to our cancer center because of proximity, currently asymptomatic. -Would order CT scan of the chest without contrast given his chronic kidney disease -Will see patient back in 3 weeks to discuss the results  2.  Tobacco abuse: Patient smoked 1 pack/day for the past 44 years.  He reports that he cut back to half pack per day since his surgery.  He is taking Chantix twice a day but misses doses.  I have counseled him to quit smoking.  3.  Hypertension: His systolic blood pressure today is 154.  I have  counseled him to be compliant with medications.  I have also counseled him to exercise on a daily basis.  Orders Placed This Encounter  Procedures  . CT Chest Wo Contrast    Order Specific Question:   Preferred imaging location?    Answer:   [facility]    Order Specific Question:   Radiology Contrast Protocol - do NOT remove file path    Answer:   [email or web address]    Order Specific Question:   Reason for Exam additional comments    Answer:   Follow-up of thymoma, status post resection    All questions were answered. The patient knows to call the clinic with any problems, questions or concerns.      Derek Jack, MD 04/07/2018 5:04 PM

## 2018-04-07 NOTE — Assessment & Plan Note (Addendum)
1.  Poorly differentiated thymic carcinoma: -Presentation with weight loss of 40 pounds, PET/CT scan on 10/05/2016 showed anterior mediastinal mass measuring 3.6 x 1.9 cm with SUV of 6.3, status post resection on 12/29/2016 -I have obtained and reviewed the pathology report dated 12/29/2016 which showed poorly differentiated thymic carcinoma, with foci of squamous differentiation, negative margin for lung tissue, negative margin for pericardial and pleura, one lymph node negative, pT2pN0M0, adjuvant chemoradiation therapy was recommended, patient declined.   -Last follow-up at [facility] in December, CT scan of the chest with contrast on 12/14/2017 shows no evidence of recurrence or metastatic disease.  Scattered small pulmonary nodules are unchanged from 09/22/2016.   -Family history with one sister died of colon cancer in her 37s, another sister with metastatic cancer at this time -Patient would like to switch care to our cancer center because of proximity, currently asymptomatic. -Would order CT scan of the chest without contrast given his chronic kidney disease -Will see patient back in 3 weeks to discuss the results  2.  Tobacco abuse: Patient smoked 1 pack/day for the past 44 years.  He reports that he cut back to half pack per day since his surgery.  He is taking Chantix twice a day but misses doses.  I have counseled him to quit smoking.  3.  Hypertension: His systolic blood pressure today is 154.  I have counseled him to be compliant with medications.  I have also counseled him to exercise on a daily basis.

## 2018-04-11 ENCOUNTER — Other Ambulatory Visit: Payer: Self-pay

## 2018-04-11 ENCOUNTER — Encounter: Payer: Self-pay | Admitting: Family Medicine

## 2018-04-11 ENCOUNTER — Ambulatory Visit (INDEPENDENT_AMBULATORY_CARE_PROVIDER_SITE_OTHER): Payer: Medicare HMO | Admitting: Family Medicine

## 2018-04-11 VITALS — BP 160/102 | HR 102 | Resp 16 | Ht 73.0 in | Wt 267.0 lb

## 2018-04-11 DIAGNOSIS — R490 Dysphonia: Secondary | ICD-10-CM | POA: Diagnosis not present

## 2018-04-11 DIAGNOSIS — E785 Hyperlipidemia, unspecified: Secondary | ICD-10-CM | POA: Diagnosis not present

## 2018-04-11 DIAGNOSIS — I10 Essential (primary) hypertension: Secondary | ICD-10-CM

## 2018-04-11 DIAGNOSIS — M5432 Sciatica, left side: Secondary | ICD-10-CM | POA: Diagnosis not present

## 2018-04-11 MED ORDER — AMLODIPINE BESYLATE 10 MG PO TABS: 10.0000 mg | ORAL_TABLET | Freq: Every day | ORAL | 1 refills | Status: DC

## 2018-04-11 MED ORDER — ATORVASTATIN CALCIUM 20 MG PO TABS: 20.0000 mg | ORAL_TABLET | Freq: Every day | ORAL | 3 refills | Status: DC

## 2018-04-11 NOTE — Progress Notes (Signed)
Patient ID: Derrick Gaines, male    DOB: September 15, 1956, 62 y.o.   MRN: [id number]  Chief Complaint  Patient presents with  . Hypertension    has not been checking blood pressure  . Nicotine Dependence    cut back to 3 per day using Chantix     Allergies Patient has no known allergies.  Subjective:   Derrick Gaines is a 62 y.o. male who presents to [facility] today.  HPI Corbett presents today with his wife. He has not picked up the BP medication at [facility]. He did get the chantix and the mobic. He reports that he has cut down on smoking and only smoking 3 cigarettes a day. His wife states he is lying and smokes more than that and does not take his medication except on rare occasions. He has had a stoke in the past. He reports that he can remember to take his medications and his wife fills up his pill box, but he just does not do it.  Is smoking.  He tells me today that he does not think that his voice is changed from last time. He denies any sore throat or lesions in his mouth.  He is also here to go over his cholesterol. Has not been on cholesterol medications. Has been seen at the cancer center and they are trying to get the records from the [place] clinic where he received his treatment for thymoma.  Back pain is better but would like a refill on the mobic. Reports that it took away all the pain.    Past Medical History:  Diagnosis Date  . Arthritis   . Cancer ([place])   . Chronic kidney disease   . Hypertension   . Stroke [facility])     Past Surgical History:  Procedure Laterality Date  . BACK SURGERY    . STERNOTOMY    . THYMECTOMY      Family History  Problem Relation Age of Onset  . Diabetes Mother   . Cancer Mother   . Colon cancer Sister   . Pneumonia Father   . Emphysema Brother      Social History   Socioeconomic History  . Marital status: Married    Spouse name: Not on file  . Number of children: Not on file  . Years of education: Not on file  .  Highest education level: Not on file  Occupational History  . Not on file  Social Needs  . Financial resource strain: Not on file  . Food insecurity:    Worry: Not on file    Inability: Not on file  . Transportation needs:    Medical: Not on file    Non-medical: Not on file  Tobacco Use  . Smoking status: Current Every Day Smoker    Packs/day: 0.50    Years: 30.00    Pack years: 15.00    Types: Cigarettes  . Smokeless tobacco: Never Used  Substance and Sexual Activity  . Alcohol use: No  . Drug use: No  . Sexual activity: Yes  Lifestyle  . Physical activity:    Days per week: Not on file    Minutes per session: Not on file  . Stress: Not on file  Relationships  . Social connections:    Talks on phone: Not on file    Gets together: Not on file    Attends religious service: Not on file    Active member of club or organization:  Not on file    Attends meetings of clubs or organizations: Not on file    Relationship status: Not on file  Other Topics Concern  . Not on file  Social History Narrative   Been on disability since age 31 due to a stroke.    Used to work at [employer].    Married. No children. Married for 15 years.   Smoke cigarettes, since age 59 years.    No alcohol. No drugs.   Enjoys fishing.    Attends church.   Eats all foods.   Wear seatbelt.    Drives.    Current Outpatient Medications on File Prior to Visit  Medication Sig Dispense Refill  . aspirin EC 81 MG tablet Take 1 tablet (81 mg total) by mouth daily.    . CHANTIX STARTING MONTH PAK 0.5 MG X 11 & 1 MG X 42 tablet     . meloxicam (MOBIC) 7.5 MG tablet Take 1 tablet (7.5 mg total) by mouth daily. 30 tablet 0  . omeprazole (PRILOSEC) 20 MG capsule Take 20 mg by mouth daily.     No current facility-administered medications on file prior to visit.     Review of Systems  Constitutional: Negative for appetite change, chills, fever and unexpected weight change.  HENT: Negative for mouth sores,  nosebleeds, rhinorrhea, trouble swallowing and voice change.   Eyes: Negative for visual disturbance.  Respiratory: Negative for cough, chest tightness, shortness of breath and wheezing.   Cardiovascular: Negative for chest pain, palpitations and leg swelling.  Gastrointestinal: Negative for abdominal pain, diarrhea, nausea and vomiting.  Genitourinary: Negative for decreased urine volume, dysuria and frequency.  Skin: Negative for rash.  Neurological: Negative for dizziness, tremors, syncope, facial asymmetry, weakness and headaches.  Hematological: Negative for adenopathy. Does not bruise/bleed easily.     Objective:   BP (!) 160/102   Pulse (!) 102   Resp 16   Ht 6\' 1"  (1.854 m)   Wt 267 lb (121.1 kg)   SpO2 97%   BMI 35.23 kg/m   Physical Exam  Constitutional: He is oriented to person, place, and time. He appears well-developed and well-nourished.  HENT:  Head: Normocephalic and atraumatic.  Very hoarse and raspy voice.   Eyes: Pupils are equal, round, and reactive to light. EOM are normal.  Neck: Normal range of motion. Neck supple. No thyromegaly present.  Cardiovascular: Normal rate, regular rhythm and normal heart sounds.  Pulmonary/Chest: Effort normal and breath sounds normal.  Musculoskeletal: He exhibits no edema.  Neurological: He is alert and oriented to person, place, and time. No cranial nerve deficit.  Skin: Skin is warm, dry and intact.  Psychiatric: He has a normal mood and affect. His behavior is normal. Thought content normal.  Vitals reviewed.    Assessment and Plan  1. Sciatica of left side Discussed with patient recommended treatment. Does not wish to do PT. Reports that pain is better and gone. He did request a refill on the mobic. However, I discussed with him that his renal function is elevated and his BP is uncontrolled and the medication would not be recommended based on these facts. He voiced understanding.   2. Voice hoarseness Refer to ent.  Voice is more raspy and hoarse then last visit and patient has significant tobacco history. Treated for GERD at this time. Agrees to evaluation.  - Ambulatory referral to ENT  3. Hyperlipidemia LDL goal <70 Hyperlipidemia and the associated risk of ASCVD were discussed today. Primary  vs. Secondary prevention of ASCVD were discussed and how it relates to patient morbidity, mortality, and quality of life. Shared decision making with patient including the risks of statins vs.benefits of ASCVD risk reduction discussed.  Risks of stains discussed including myopathy, rhabdomyoloysis, liver problems, increased risk of diabetes discussed. We discussed heart healthy diet, lifestyle modifications, risk factor modifications, and adherence to the recommended treatment plan. We discussed the need to periodically monitor lipid panel and liver function tests while on statin therapy.  Discussed that this medication would be for secondary prevention due to the fact that he has had a stroke.  - atorvastatin (LIPITOR) 20 MG tablet; Take 1 tablet (20 mg total) by mouth daily.  Dispense: 90 tablet; Refill: 3  4. HTN, goal below 140/90 Another script send to drug store. Patient counseled on the risk of CVA, kidney disease, and  AMI and other cardiovascular complications related to HTN.  - amLODipine (NORVASC) 10 MG tablet; Take 1 tablet (10 mg total) by mouth daily.  Dispense: 90 tablet; Refill: 1 Lifestyle modifications discussed with patient including a diet emphasizing vegetables, fruits, and whole grains. Limiting intake of sodium to less than 2,400 mg per day.  Recommendations discussed include consuming low-fat dairy products, poultry, fish, legumes, non-tropical vegetable oils, and nuts; and limiting intake of sweets, sugar-sweetened beverages, and red meat. Discussed following a plan such as the Dietary Approaches to Stop Hypertension (DASH) diet. Patient to read up on this diet.  Follow up in3 weeks for recheck.    Tobacco cessation recommended and advised.   Return in about 3 weeks (around 05/02/2018) for follow up. Caren Macadam, MD 04/11/2018

## 2018-04-11 NOTE — Patient Instructions (Signed)
You should be taking:  Amlodipine 10 mg each morning for Blood pressure Atorvastatin 20 mg each night for high cholesterol Asprin 81mg  a day for stroke prevention  You should quit smoking right now.  You need to see the ENT doctor because your voice is not normal.  You need to follow up with the cancer doctor/Dr. K  You need to come back and see me in 3-4 weeks

## 2018-04-18 DIAGNOSIS — I688 Other cerebrovascular disorders in diseases classified elsewhere: Secondary | ICD-10-CM | POA: Diagnosis not present

## 2018-04-18 DIAGNOSIS — M419 Scoliosis, unspecified: Secondary | ICD-10-CM | POA: Diagnosis not present

## 2018-04-18 DIAGNOSIS — I10 Essential (primary) hypertension: Secondary | ICD-10-CM | POA: Diagnosis not present

## 2018-04-18 DIAGNOSIS — M5432 Sciatica, left side: Secondary | ICD-10-CM | POA: Diagnosis not present

## 2018-04-19 ENCOUNTER — Telehealth: Payer: Self-pay | Admitting: Family Medicine

## 2018-04-19 NOTE — Telephone Encounter (Signed)
I have spoke with Mr and Mrs Tsang and they are aware the Records are at the front desk and will pick up before there appointment with Dr Delton Coombes.

## 2018-04-19 NOTE — Telephone Encounter (Signed)
Please call patient and/or his wife and advised him that I received his hematology/oncology office visit notes from the [facility].  Advised him that Dr. Delton Coombes is the physician that needs these records.  I read and Dr. Tomie China note that he has been trying to get these records.  Please advise him that I would like for him to pick these records up at our office and take them to his next scheduled visit with Dr. Delton Coombes which is scheduled for May 9.

## 2018-05-01 ENCOUNTER — Ambulatory Visit (HOSPITAL_COMMUNITY)
Admission: RE | Admit: 2018-05-01 | Discharge: 2018-05-01 | Disposition: A | Discharge status: Home / Self Care | Payer: Medicare HMO | Source: Ambulatory Visit | Attending: Hematology | Admitting: Hematology

## 2018-05-01 DIAGNOSIS — C801 Malignant (primary) neoplasm, unspecified: Secondary | ICD-10-CM | POA: Insufficient documentation

## 2018-05-01 DIAGNOSIS — D150 Benign neoplasm of thymus: Secondary | ICD-10-CM | POA: Diagnosis not present

## 2018-05-01 DIAGNOSIS — J984 Other disorders of lung: Secondary | ICD-10-CM | POA: Diagnosis not present

## 2018-05-04 ENCOUNTER — Ambulatory Visit (HOSPITAL_COMMUNITY): Payer: Medicare HMO | Admitting: Hematology

## 2018-05-04 ENCOUNTER — Other Ambulatory Visit (HOSPITAL_COMMUNITY): Payer: Self-pay | Admitting: Hematology

## 2018-05-04 ENCOUNTER — Ambulatory Visit
Admission: RE | Admit: 2018-05-04 | Discharge: 2018-05-04 | Disposition: A | Discharge status: Home / Self Care | Payer: Self-pay | Source: Ambulatory Visit | Attending: Hematology | Admitting: Hematology

## 2018-05-04 DIAGNOSIS — D150 Benign neoplasm of thymus: Principal | ICD-10-CM

## 2018-05-04 DIAGNOSIS — D4989 Neoplasm of unspecified behavior of other specified sites: Secondary | ICD-10-CM

## 2018-05-05 ENCOUNTER — Other Ambulatory Visit: Payer: Self-pay

## 2018-05-05 ENCOUNTER — Ambulatory Visit (INDEPENDENT_AMBULATORY_CARE_PROVIDER_SITE_OTHER): Payer: Medicare HMO | Admitting: Family Medicine

## 2018-05-05 ENCOUNTER — Encounter: Payer: Self-pay | Admitting: Family Medicine

## 2018-05-05 VITALS — BP 150/90 | HR 82 | Temp 98.2°F | Resp 14 | Ht 73.0 in | Wt 269.0 lb

## 2018-05-05 DIAGNOSIS — I10 Essential (primary) hypertension: Secondary | ICD-10-CM

## 2018-05-05 MED ORDER — LOSARTAN POTASSIUM 50 MG PO TABS: 50.0000 mg | ORAL_TABLET | Freq: Every day | ORAL | 3 refills | Status: DC

## 2018-05-05 NOTE — Progress Notes (Signed)
Patient ID: Derrick Gaines, male    DOB: Oct 07, 1956, 62 y.o.   MRN: [id number]  Chief Complaint  Patient presents with  . Hypertension    follow up    Allergies Patient has no known allergies.  Subjective:   Derrick Gaines is a 62 y.o. male who presents to [facility] today.  HPI Here for follow up for BP.  Reports that he has been taking his blood pressure medication most days.  He reports that he is trying to cut down on his cigarette use.  Reports that he does feel better.  Denies any side effects or problems with medication.  Blood pressure is still running high.  Reports he would be willing to take another medication.  Denies any chest pain, shortness of breath, or swelling in his extremities.  Has been seen by Dr. Merlene Laughter for his low back pain.  Is not taking the meloxicam at this time.  Reports he has been taking some Aleve over-the-counter.  Has no history of prior kidney disease.  He is not accompanied by his wife today.  Reports that he is very hungry and would like to speed up the office visit so he can go get his burger and fries.   Past Medical History:  Diagnosis Date  . Arthritis   . Cancer ([place])   . Chronic kidney disease   . Hypertension   . Stroke [facility])     Past Surgical History:  Procedure Laterality Date  . BACK SURGERY    . STERNOTOMY    . THYMECTOMY      Family History  Problem Relation Age of Onset  . Diabetes Mother   . Cancer Mother   . Colon cancer Sister   . Pneumonia Father   . Emphysema Brother      Social History   Socioeconomic History  . Marital status: Married    Spouse name: Not on file  . Number of children: Not on file  . Years of education: Not on file  . Highest education level: Not on file  Occupational History  . Not on file  Social Needs  . Financial resource strain: Not on file  . Food insecurity:    Worry: Not on file    Inability: Not on file  . Transportation needs:    Medical: Not on file   Non-medical: Not on file  Tobacco Use  . Smoking status: Current Every Day Smoker    Packs/day: 0.50    Years: 30.00    Pack years: 15.00    Types: Cigarettes  . Smokeless tobacco: Never Used  Substance and Sexual Activity  . Alcohol use: No  . Drug use: No  . Sexual activity: Yes  Lifestyle  . Physical activity:    Days per week: Not on file    Minutes per session: Not on file  . Stress: Not on file  Relationships  . Social connections:    Talks on phone: Not on file    Gets together: Not on file    Attends religious service: Not on file    Active member of club or organization: Not on file    Attends meetings of clubs or organizations: Not on file    Relationship status: Not on file  Other Topics Concern  . Not on file  Social History Narrative   Been on disability since age 46 due to a stroke.    Used to work at [employer].    Married.  No children. Married for 15 years.   Smoke cigarettes, since age 72 years.    No alcohol. No drugs.   Enjoys fishing.    Attends church.   Eats all foods.   Wear seatbelt.    Drives.    Current Outpatient Medications on File Prior to Visit  Medication Sig Dispense Refill  . amLODipine (NORVASC) 10 MG tablet Take 1 tablet (10 mg total) by mouth daily. 90 tablet 1  . aspirin EC 81 MG tablet Take 1 tablet (81 mg total) by mouth daily.    Marland Kitchen atorvastatin (LIPITOR) 20 MG tablet Take 1 tablet (20 mg total) by mouth daily. 90 tablet 3  . CHANTIX STARTING MONTH PAK 0.5 MG X 11 & 1 MG X 42 tablet     . omeprazole (PRILOSEC) 20 MG capsule Take 20 mg by mouth daily.     No current facility-administered medications on file prior to visit.     Review of Systems  Constitutional: Negative for appetite change, chills, fever and unexpected weight change.  HENT: Negative for trouble swallowing and voice change.   Eyes: Negative for visual disturbance.  Respiratory: Negative for cough, chest tightness, shortness of breath and wheezing.     Cardiovascular: Negative for chest pain, palpitations and leg swelling.  Gastrointestinal: Negative for abdominal pain, diarrhea, nausea and vomiting.  Genitourinary: Negative for decreased urine volume, dysuria and frequency.  Skin: Negative for rash.  Neurological: Negative for dizziness, tremors, syncope, facial asymmetry, weakness and headaches.  Hematological: Negative for adenopathy. Does not bruise/bleed easily.     Objective:   BP (!) 150/90 (BP Location: Left Arm, Patient Position: Sitting, Cuff Size: Large)   Pulse 82   Temp 98.2 F (36.8 C) (Oral)   Resp 14   Ht 6\' 1"  (1.854 m)   Wt 269 lb (122 kg)   SpO2 93%   BMI 35.49 kg/m   Physical Exam  Constitutional: He is oriented to person, place, and time. He appears well-developed and well-nourished.  HENT:  Head: Normocephalic and atraumatic.  Eyes: Pupils are equal, round, and reactive to light. Conjunctivae and EOM are normal.  Neck: Normal range of motion. Neck supple. No thyromegaly present.  Cardiovascular: Normal rate, regular rhythm and normal heart sounds.  Pulmonary/Chest: Effort normal and breath sounds normal.  Musculoskeletal: He exhibits no edema.  Neurological: He is alert and oriented to person, place, and time. No cranial nerve deficit.  Skin: Skin is warm, dry and intact.  Psychiatric: He has a normal mood and affect. His behavior is normal. Thought content normal.  Vitals reviewed.    Assessment and Plan  1. HTN, goal below 140/90 Add losartan. Continue the norvasc qd.  Recommended to stop taking Aleve due to the possible renal and cardiac side effects.  He voiced understanding. Lifestyle modifications discussed with patient including a diet emphasizing vegetables, fruits, and whole grains. Limiting intake of sodium to less than 2,400 mg per day.  Recommendations discussed include consuming low-fat dairy products, poultry, fish, legumes, non-tropical vegetable oils, and nuts; and limiting intake  of sweets, sugar-sweetened beverages, and red meat. Discussed following a plan such as the Dietary Approaches to Stop Hypertension (DASH) diet. Patient to read up on this diet.   - losartan (COZAAR) 50 MG tablet; Take 1 tablet (50 mg total) by mouth daily.  Dispense: 90 tablet; Refill: 3  The 5 A's Model for treating Tobacco Use and Dependence was used today. I have identified and documented tobacco use status for  this patient. I have urged the patient to quit tobacco use. At this time, the patient is unwilling and not ready to attempt to quit. I have provided patient with information regarding risks, cessation techniques, and interventions that might increase future attempts to quit smoking. I will plan on again addressing tobacco dependence at the next visit.  Return in about 3 weeks (around 05/26/2018) for follow up. Caren Macadam, MD 05/05/2018

## 2018-05-05 NOTE — Patient Instructions (Signed)
Follow up for BP check You need to be taking the amlodipine each day Start also taking the losartan 50 mg each morning.  Come back for BP check /office in 3 weeks.  You will have to have blood work at that time.

## 2018-05-15 ENCOUNTER — Inpatient Hospital Stay (HOSPITAL_COMMUNITY): Payer: Medicare HMO | Attending: Hematology | Admitting: Hematology

## 2018-05-31 ENCOUNTER — Ambulatory Visit (INDEPENDENT_AMBULATORY_CARE_PROVIDER_SITE_OTHER): Payer: Medicare HMO | Admitting: Family Medicine

## 2018-05-31 ENCOUNTER — Other Ambulatory Visit: Payer: Self-pay

## 2018-05-31 ENCOUNTER — Encounter: Payer: Self-pay | Admitting: Family Medicine

## 2018-05-31 VITALS — BP 164/90 | HR 95 | Temp 98.2°F | Resp 16 | Ht 73.0 in | Wt 276.0 lb

## 2018-05-31 DIAGNOSIS — Z720 Tobacco use: Secondary | ICD-10-CM | POA: Diagnosis not present

## 2018-05-31 DIAGNOSIS — I10 Essential (primary) hypertension: Secondary | ICD-10-CM | POA: Diagnosis not present

## 2018-05-31 MED ORDER — LOSARTAN POTASSIUM 100 MG PO TABS: 100.0000 mg | ORAL_TABLET | Freq: Every day | ORAL | 3 refills | Status: DC

## 2018-05-31 NOTE — Progress Notes (Signed)
Patient ID: [name], male    DOB: Mar 18, 1956, 62 y.o.   MRN: [id number]  Chief Complaint  Patient presents with  . Hypertension    3 week follow up    Allergies Patient has no known allergies.  Subjective:   Derrick Gaines is a 62 y.o. male who presents to [facility] today.  HPI Derrick Gaines presents today for follow-up of his blood pressure.  He reports that he did start the losartan 50 mg at night.  He has been taking the medication at night because he thought it would be better than taking it in the day.  He reports he did not continue the Norvasc as was recommended.  He reports he was concerned that he would be taking too many medications.  He reports he has been taking his aspirin and he is taking his cholesterol medication.  He reports he has not been doing well with his diet and continues to drink at least 1 to 2 L of [facility] a day and sweet tea.  He reports that he continues to eat out fast food on a daily basis.  He also reports that he continues to smoke cigarettes.  He did not want to use the Chantix and is not interested in quitting.  He denies any chest pain, shortness of breath or swelling in his extremities.  He reports he has not been to go see the ear nose and throat for his voice.  He reports that his voice is always been the way that it is now.   Past Medical History:  Diagnosis Date  . Arthritis   . Cancer ([place])   . Chronic kidney disease   . Hypertension   . Stroke [facility])     Past Surgical History:  Procedure Laterality Date  . BACK SURGERY    . STERNOTOMY    . THYMECTOMY      Family History  Problem Relation Age of Onset  . Diabetes Mother   . Cancer Mother   . Colon cancer Sister   . Pneumonia Father   . Emphysema Brother      Social History   Socioeconomic History  . Marital status: Married    Spouse name: Not on file  . Number of children: Not on file  . Years of education: Not on file  . Highest education level: Not  on file  Occupational History  . Not on file  Social Needs  . Financial resource strain: Not on file  . Food insecurity:    Worry: Not on file    Inability: Not on file  . Transportation needs:    Medical: Not on file    Non-medical: Not on file  Tobacco Use  . Smoking status: Current Every Day Smoker    Packs/day: 0.50    Years: 30.00    Pack years: 15.00    Types: Cigarettes  . Smokeless tobacco: Never Used  Substance and Sexual Activity  . Alcohol use: No  . Drug use: No  . Sexual activity: Yes  Lifestyle  . Physical activity:    Days per week: Not on file    Minutes per session: Not on file  . Stress: Not on file  Relationships  . Social connections:    Talks on phone: Not on file    Gets together: Not on file    Attends religious service: Not on file    Active member of club or organization: Not on file  Attends meetings of clubs or organizations: Not on file    Relationship status: Not on file  Other Topics Concern  . Not on file  Social History Narrative   Been on disability since age 28 due to a stroke.    Used to work at [employer].    Married. No children. Married for 15 years.   Smoke cigarettes, since age 86 years.    No alcohol. No drugs.   Enjoys fishing.    Attends church.   Eats all foods.   Wear seatbelt.    Drives.    Current Outpatient Medications on File Prior to Visit  Medication Sig Dispense Refill  . aspirin EC 81 MG tablet Take 1 tablet (81 mg total) by mouth daily.    Marland Kitchen atorvastatin (LIPITOR) 20 MG tablet Take 1 tablet (20 mg total) by mouth daily. 90 tablet 3  . omeprazole (PRILOSEC) 20 MG capsule Take 20 mg by mouth daily.     No current facility-administered medications on file prior to visit.     Review of Systems  Constitutional: Negative for appetite change, chills, fever and unexpected weight change.  HENT: Negative for trouble swallowing and voice change.   Eyes: Negative for visual disturbance.  Respiratory: Negative for  cough, chest tightness, shortness of breath and wheezing.   Cardiovascular: Negative for chest pain, palpitations and leg swelling.  Gastrointestinal: Negative for abdominal pain, diarrhea, nausea and vomiting.  Genitourinary: Negative for decreased urine volume, dysuria and frequency.  Musculoskeletal: Negative for myalgias.  Skin: Negative for rash.  Neurological: Negative for dizziness, tremors, syncope, facial asymmetry, weakness and headaches.  Hematological: Negative for adenopathy. Does not bruise/bleed easily.  Psychiatric/Behavioral: Negative for dysphoric mood.     Objective:   BP (!) 164/90 (BP Location: Right Arm, Patient Position: Sitting, Cuff Size: Large)   Pulse 95   Temp 98.2 F (36.8 C) (Temporal)   Resp 16   Ht 6\' 1"  (1.854 m)   Wt 276 lb 0.6 oz (125.2 kg)   SpO2 95%   BMI 36.42 kg/m   Physical Exam  Constitutional: He is oriented to person, place, and time. He appears well-developed and well-nourished.  HENT:  Head: Normocephalic and atraumatic.  Eyes: Pupils are equal, round, and reactive to light. EOM are normal.  Neck: Normal range of motion. Neck supple. No thyromegaly present.  Cardiovascular: Normal rate, regular rhythm and normal heart sounds.  Pulmonary/Chest: Effort normal and breath sounds normal.  Musculoskeletal: He exhibits no edema.  Neurological: He is alert and oriented to person, place, and time. No cranial nerve deficit.  Skin: Skin is warm, dry and intact. Capillary refill takes less than 2 seconds.  Psychiatric: He has a normal mood and affect. His behavior is normal. Thought content normal.  Vitals reviewed.    Assessment and Plan  1. HTN, goal below 140/90 Recheck  BP in 3 weeks. Discontinue the losartan 50 mg a day. Start losartan 100 mg each morning. We will hold off on the Norvasc and plan to recheck blood pressure in 3 weeks. Medication compliance recommended. Diet, exercise and weight loss recommended. Check BMP at  follow-up. - losartan (COZAAR) 100 MG tablet; Take 1 tablet (100 mg total) by mouth daily.  Dispense: 90 tablet; Refill: 3 Patient counseled in detail regarding the risks of medication. Told to call or return to clinic if develop any worrisome signs or symptoms. Patient voiced understanding.   2. Tobacco abuse The 5 A's Model for treating Tobacco Use and Dependence  was used today. I have identified and documented tobacco use status for this patient. I have urged the patient to quit tobacco use. At this time, the patient is unwilling and not ready to attempt to quit. I have provided patient with information regarding risks, cessation techniques, and interventions that might increase future attempts to quit smoking. I will plan on again addressing tobacco dependence at the next visit.  I discussed with patient that he has not been following directions regarding his health care.  Discussed with patient that uncontrolled blood pressure increases his risk of recurrent stroke, heart attack, and cardiovascular complications.  We discussed that he has been noncompliant with his medications as they have been prescribed.  He reports that he is going to make the need to changes in his diet and take his medications as directed.  He will follow-up in 3 weeks.  He will call with any questions or concerns. Return in about 3 weeks (around 06/21/2018) for Blood pressure check. Caren Macadam, MD 05/31/2018

## 2018-05-31 NOTE — Patient Instructions (Addendum)
   Increase the losartan to 100 mg a day. Stop the 50 mg tablet and start the 100  Mg tablet each morning.  I have sent this to the pharmacy.  Make sure you are taking your cholesterol medication

## 2018-06-01 ENCOUNTER — Encounter: Payer: Self-pay | Admitting: Family Medicine

## 2018-06-02 ENCOUNTER — Encounter: Payer: Self-pay | Admitting: Family Medicine

## 2018-06-05 ENCOUNTER — Inpatient Hospital Stay (HOSPITAL_COMMUNITY): Payer: Medicare HMO | Attending: Hematology | Admitting: Hematology

## 2018-06-05 ENCOUNTER — Encounter (HOSPITAL_COMMUNITY): Payer: Self-pay | Admitting: Hematology

## 2018-06-05 ENCOUNTER — Telehealth: Payer: Self-pay | Admitting: Family Medicine

## 2018-06-05 ENCOUNTER — Other Ambulatory Visit: Payer: Self-pay

## 2018-06-05 VITALS — BP 163/93 | HR 79 | Temp 98.4°F | Resp 18 | Wt 277.0 lb

## 2018-06-05 DIAGNOSIS — R918 Other nonspecific abnormal finding of lung field: Secondary | ICD-10-CM | POA: Diagnosis not present

## 2018-06-05 DIAGNOSIS — Z85238 Personal history of other malignant neoplasm of thymus: Secondary | ICD-10-CM | POA: Insufficient documentation

## 2018-06-05 DIAGNOSIS — F17218 Nicotine dependence, cigarettes, with other nicotine-induced disorders: Secondary | ICD-10-CM | POA: Insufficient documentation

## 2018-06-05 DIAGNOSIS — Z800 Family history of malignant neoplasm of digestive organs: Secondary | ICD-10-CM | POA: Diagnosis not present

## 2018-06-05 DIAGNOSIS — I10 Essential (primary) hypertension: Secondary | ICD-10-CM

## 2018-06-05 DIAGNOSIS — E7800 Pure hypercholesterolemia, unspecified: Secondary | ICD-10-CM

## 2018-06-05 DIAGNOSIS — D4989 Neoplasm of unspecified behavior of other specified sites: Secondary | ICD-10-CM

## 2018-06-05 DIAGNOSIS — D150 Benign neoplasm of thymus: Secondary | ICD-10-CM | POA: Diagnosis not present

## 2018-06-05 DIAGNOSIS — E785 Hyperlipidemia, unspecified: Secondary | ICD-10-CM

## 2018-06-05 NOTE — Telephone Encounter (Signed)
Needs something to stop smoking---please call in to [facility] in [place].

## 2018-06-05 NOTE — Assessment & Plan Note (Signed)
1.  Poorly differentiated thymic carcinoma: -Presentation with weight loss of 40 pounds, PET/CT scan on 10/05/2016 showed anterior mediastinal mass measuring 3.6 x 1.9 cm with SUV of 6.3, status post resection on 12/29/2016 -I have obtained and reviewed the pathology report dated 12/29/2016 which showed poorly differentiated thymic carcinoma, with foci of squamous differentiation, negative margin for lung tissue, negative margin for pericardial and pleura, one lymph node negative, pT2pN0M0, adjuvant chemoradiation therapy was recommended, patient declined.   -Last follow-up at [facility] hospital in December, CT scan of the chest with contrast on 12/14/2017 shows no evidence of recurrence or metastatic disease.  Scattered small pulmonary nodules are unchanged from 09/22/2016.   -Family history with one sister died of colon cancer in her 20s, another sister with metastatic cancer at this time - I have discussed the results of the CT scan of the chest dated 05/01/2018 which shows mild nodular thickening in the anterior mediastinum, related to his prior therapy. -We will continue surveillance for recurrence with CT of the chest with contrast every 6 months for 2 years, then annually for 5 years.  I have given an appointment.  2.  Tobacco abuse: Patient smoked 1 pack/day for the past 44 years.  He reports that he cut back to half pack per day since his surgery.  He reportedly tried Chantix but it did not help.  He will call Dr. Mannie Stabile for various options.  I have counseled extensively to quit smoking.  3.  Hypertension: His systolic blood pressure is 163.  He was counseled to exercise 30 minutes a day most days of the week.

## 2018-06-05 NOTE — Progress Notes (Signed)
[address], [place] [address]   CLINIC:  Medical Oncology/Hematology  PCP:  Caren Macadam, [place] [address] [phone]   REASON FOR VISIT:  Follow-up for thymic carcinoma.  CURRENT THERAPY: Surveillance.    INTERVAL HISTORY:  Mr. Blackshire 62 y.o. male returns for follow-up of CT scan results for thymic carcinoma surveillance.  He is continuing to smoke half pack to 1 pack/day of cigarettes.  He is not exercising much.  He denies any chest pains.  He denies any change in his cough.  He does report some shortness of breath with activity.  No cough or hemoptysis noted.  No recent hospitalizations or infections.  REVIEW OF SYSTEMS:  Review of Systems  Constitutional: Positive for fatigue.  Respiratory: Positive for shortness of breath.   All other systems reviewed and are negative.    PAST MEDICAL/SURGICAL HISTORY:  Past Medical History:  Diagnosis Date  . Arthritis   . Cancer ([place])   . Chronic kidney disease   . Hypertension   . Stroke [facility])    Past Surgical History:  Procedure Laterality Date  . BACK SURGERY    . STERNOTOMY    . THYMECTOMY       SOCIAL HISTORY:  Social History   Socioeconomic History  . Marital status: Married    Spouse name: Not on file  . Number of children: Not on file  . Years of education: Not on file  . Highest education level: Not on file  Occupational History  . Not on file  Social Needs  . Financial resource strain: Not on file  . Food insecurity:    Worry: Not on file    Inability: Not on file  . Transportation needs:    Medical: Not on file    Non-medical: Not on file  Tobacco Use  . Smoking status: Current Every Day Smoker    Packs/day: 0.50    Years: 30.00    Pack years: 15.00    Types: Cigarettes  . Smokeless tobacco: Never Used  Substance and Sexual Activity  . Alcohol use: No  . Drug use: No  . Sexual activity: Yes  Lifestyle  . Physical  activity:    Days per week: Not on file    Minutes per session: Not on file  . Stress: Not on file  Relationships  . Social connections:    Talks on phone: Not on file    Gets together: Not on file    Attends religious service: Not on file    Active member of club or organization: Not on file    Attends meetings of clubs or organizations: Not on file    Relationship status: Not on file  . Intimate partner violence:    Fear of current or ex partner: Not on file    Emotionally abused: Not on file    Physically abused: Not on file    Forced sexual activity: Not on file  Other Topics Concern  . Not on file  Social History Narrative   Been on disability since age 72 due to a stroke.    Used to work at [employer].    Married. No children. Married for 15 years.   Smoke cigarettes, since age 82 years.    No alcohol. No drugs.   Enjoys fishing.    Attends church.   Eats all foods.   Wear seatbelt.    Drives.  FAMILY HISTORY:  Family History  Problem Relation Age of Onset  . Diabetes Mother   . Cancer Mother   . Colon cancer Sister   . Pneumonia Father   . Emphysema Brother     CURRENT MEDICATIONS:  Outpatient Encounter Medications as of 06/05/2018  Medication Sig  . aspirin EC 81 MG tablet Take 1 tablet (81 mg total) by mouth daily.  Marland Kitchen atorvastatin (LIPITOR) 20 MG tablet Take 1 tablet (20 mg total) by mouth daily.  Marland Kitchen losartan (COZAAR) 100 MG tablet Take 1 tablet (100 mg total) by mouth daily.  Marland Kitchen omeprazole (PRILOSEC) 20 MG capsule Take 20 mg by mouth daily.   No facility-administered encounter medications on file as of 06/05/2018.     ALLERGIES:  No Known Allergies   PHYSICAL EXAM:  ECOG Performance status: 1  I have reviewed his vitals today.  Blood pressure is 163/93.  Pulse is 79.  Respiratory rate 18.  Temperature is 98.4.  Saturations are 98%. Physical Exam HEENT: Oropharynx has no thrush or mucositis. Chest: Bilateral clear to auscultation. Cardiovascular:  S1-S2 regular rate and rhythm. Abdomen: Soft nontender with no palpable or megaly.   Extremities: No edema or cyanosis.  LABORATORY DATA:  I have reviewed the labs as listed.  CBC    Component Value Date/Time   WBC 5.6 03/08/2018 1200   RBC 4.64 03/08/2018 1200   HGB 15.2 03/08/2018 1200   HCT 43.4 03/08/2018 1200   PLT 325 03/08/2018 1200   MCV 93.5 03/08/2018 1200   MCH 32.8 03/08/2018 1200   MCHC 35.0 03/08/2018 1200   RDW 11.8 03/08/2018 1200   LYMPHSABS 2,022 03/08/2018 1200   MONOABS 0.5 02/12/2012 2003   EOSABS 118 03/08/2018 1200   BASOSABS 50 03/08/2018 1200   CMP Latest Ref Rng & Units 03/08/2018 02/14/2012 02/13/2012  Glucose 65 - 99 mg/dL 110(H) 99 100(H)  BUN 7 - 25 mg/dL 20 6 7   Creatinine 0.70 - 1.25 mg/dL 1.57(H) 1.27 1.15  Sodium 135 - 146 mmol/L 138 140 135  Potassium 3.5 - 5.3 mmol/L 4.4 3.5 3.5  Chloride 98 - 110 mmol/L 105 104 103  CO2 20 - 32 mmol/L 28 29 27   Calcium 8.6 - 10.3 mg/dL 9.1 9.1 8.8  Total Protein 6.1 - 8.1 g/dL 6.9 6.8 6.5  Total Bilirubin 0.2 - 1.2 mg/dL 0.5 0.6 0.5  Alkaline Phos 39 - 117 U/L - 54 54  AST 10 - 35 U/L 19 14 16   ALT 9 - 46 U/L 13 7 8        DIAGNOSTIC IMAGING:  I have independently reviewed the images of the CT scan dated 05/01/2018 and agree with the report.    ASSESSMENT & PLAN:   Thymoma 1.  Poorly differentiated thymic carcinoma: -Presentation with weight loss of 40 pounds, PET/CT scan on 10/05/2016 showed anterior mediastinal mass measuring 3.6 x 1.9 cm with SUV of 6.3, status post resection on 12/29/2016 -I have obtained and reviewed the pathology report dated 12/29/2016 which showed poorly differentiated thymic carcinoma, with foci of squamous differentiation, negative margin for lung tissue, negative margin for pericardial and pleura, one lymph node negative, pT2pN0M0, adjuvant chemoradiation therapy was recommended, patient declined.   -Last follow-up at [facility] hospital in December, CT scan of the chest with contrast  on 12/14/2017 shows no evidence of recurrence or metastatic disease.  Scattered small pulmonary nodules are unchanged from 09/22/2016.   -Family history with one sister died of colon cancer in her 59s, another sister  with metastatic cancer at this time - I have discussed the results of the CT scan of the chest dated 05/01/2018 which shows mild nodular thickening in the anterior mediastinum, related to his prior therapy. -We will continue surveillance for recurrence with CT of the chest with contrast every 6 months for 2 years, then annually for 5 years.  I have given an appointment.  2.  Tobacco abuse: Patient smoked 1 pack/day for the past 44 years.  He reports that he cut back to half pack per day since his surgery.  He reportedly tried Chantix but it did not help.  He will call Dr. Mannie Stabile for various options.  I have counseled extensively to quit smoking.  3.  Hypertension: His systolic blood pressure is 163.  He was counseled to exercise 30 minutes a day most days of the week.      Orders placed this encounter:  Orders Placed This Encounter  Procedures  . CT Chest W Contrast  . CBC with Differential  . Comprehensive metabolic panel      Derek Jack, MD [place] [phone]

## 2018-06-06 NOTE — Telephone Encounter (Signed)
Please call patient and advised that we will need to discuss at his follow-up.  He has been given a prescription for Chantix in the past and did not like the medication.  In addition, he may try nicotine patches over-the-counter.  We would need to discuss other medicines before calling in prescriptions.  He does have a scheduled follow-up for his blood pressure on June 28.  Can discuss at that time.

## 2018-06-06 NOTE — Telephone Encounter (Signed)
Left message requesting  call back.

## 2018-06-08 ENCOUNTER — Ambulatory Visit (INDEPENDENT_AMBULATORY_CARE_PROVIDER_SITE_OTHER): Payer: Medicare HMO | Admitting: Otolaryngology

## 2018-06-08 NOTE — Telephone Encounter (Signed)
Left message requesting  call back.

## 2018-06-16 NOTE — Telephone Encounter (Signed)
Called patient returning call. Unable to reach patient. 3rd attempt.

## 2018-06-23 ENCOUNTER — Telehealth: Payer: Self-pay | Admitting: Family Medicine

## 2018-06-23 ENCOUNTER — Ambulatory Visit: Payer: Medicare HMO | Admitting: Family Medicine

## 2018-06-23 NOTE — Telephone Encounter (Signed)
Patients wife came into the office at 9:15 to let Dr.Hagler know that patient has not been taking his blood pressure medications that she has been prescribing him and that is why he is not getting better.  Patient came in yelling at his wife to mind her own business, to pack her stuff, and get out..he made a seen in our waiting room.

## 2018-06-23 NOTE — Telephone Encounter (Signed)
Patient has been counseled multiple times about the risks of non-compliance. Gwen Her. Mannie Stabile, MD

## 2018-07-27 ENCOUNTER — Ambulatory Visit (INDEPENDENT_AMBULATORY_CARE_PROVIDER_SITE_OTHER): Payer: Medicare HMO | Admitting: Otolaryngology

## 2018-07-27 DIAGNOSIS — R490 Dysphonia: Secondary | ICD-10-CM | POA: Diagnosis not present

## 2018-07-27 DIAGNOSIS — J381 Polyp of vocal cord and larynx: Secondary | ICD-10-CM | POA: Diagnosis not present

## 2018-07-28 ENCOUNTER — Other Ambulatory Visit: Payer: Self-pay | Admitting: Otolaryngology

## 2018-08-16 ENCOUNTER — Other Ambulatory Visit: Payer: Self-pay

## 2018-08-16 ENCOUNTER — Encounter: Payer: Self-pay | Admitting: Family Medicine

## 2018-08-16 ENCOUNTER — Ambulatory Visit (INDEPENDENT_AMBULATORY_CARE_PROVIDER_SITE_OTHER): Payer: Medicare HMO | Admitting: Family Medicine

## 2018-08-16 VITALS — BP 170/90 | HR 87 | Temp 97.6°F | Resp 12 | Ht 74.0 in | Wt 272.0 lb

## 2018-08-16 DIAGNOSIS — I10 Essential (primary) hypertension: Secondary | ICD-10-CM

## 2018-08-16 DIAGNOSIS — E7800 Pure hypercholesterolemia, unspecified: Secondary | ICD-10-CM | POA: Diagnosis not present

## 2018-08-16 DIAGNOSIS — E785 Hyperlipidemia, unspecified: Secondary | ICD-10-CM

## 2018-08-16 DIAGNOSIS — Z23 Encounter for immunization: Secondary | ICD-10-CM

## 2018-08-16 DIAGNOSIS — Z720 Tobacco use: Secondary | ICD-10-CM

## 2018-08-16 MED ORDER — AMLODIPINE BESYLATE 10 MG PO TABS: 10.0000 mg | ORAL_TABLET | Freq: Every day | ORAL | 0 refills | Status: DC

## 2018-08-16 NOTE — Addendum Note (Signed)
Addended by: Marion Downer A on: 08/16/2018 03:10 PM   Modules accepted: Orders

## 2018-08-16 NOTE — Patient Instructions (Signed)
Get your labs done today  Start the norvasc 10 mg a day and CONTINUE taking the losartan 100 mg a day  YOU are supposed to be on 2 different pills for Blood pressure.   You need to come back in 2 weeks have your Blood pressure check and see if we need to add more medication to get it controlled. Your BP needs to be less than 140/90.

## 2018-08-16 NOTE — Progress Notes (Signed)
Patient ID: Derrick Gaines, male    DOB: 1956/02/24, 62 y.o.   MRN: [id number]  Chief Complaint  Patient presents with  . Hypertension    follow up    Allergies Patient has no known allergies.  Subjective:   Derrick Gaines is a 62 y.o. male who presents to [facility] today.  HPI [name] presents today for follow-up of his blood pressure.  He tells me that he has been taking the losartan 100 mg each day.  He reports he has not had any side effects with the medication.  He reports that he took his medication today and is been taking it every day.  He reports he has been trying to eat better.  He is still smoking cigarettes.  He denies any chest pain, shortness of breath, or swelling in his extremities.  He reports he is taking his cholesterol medication.  He did not get his labs checked as he was instructed last time.  He does have a previous history of stroke.  Reports that he has not had any numbness or tingling in his extremities.  No weakness.  Vision is normal.  He is adamant that he has been taking his medications as directed. He did go and see ENT due to his voice change.  He is scheduled to have surgery next month for a lesion on his vocal cords.  He reports that he will also keep his scheduled follow-up with oncology secondary to his history of thymoma.  He reports that his appetite is good.  Denies any fevers, nausea, vomiting, or night sweats.  His wife, who is on his DPR form, comes and would like to speak with me after the visit.  She brings in all the medications and let me know that he has not been taking the medications as directed.  She reports that he did take 1 pill today but has not taken the medication in 3 months.  She reports that he always lies and tells the doctors that he is taking the medication when he never does.   Past Medical History:  Diagnosis Date  . Arthritis   . Cancer ([place])   . Chronic kidney disease   . Hypertension   . Stroke [facility])      Past Surgical History:  Procedure Laterality Date  . BACK SURGERY    . STERNOTOMY    . THYMECTOMY      Family History  Problem Relation Age of Onset  . Diabetes Mother   . Cancer Mother   . Colon cancer Sister   . Pneumonia Father   . Emphysema Brother      Social History   Socioeconomic History  . Marital status: Married    Spouse name: Not on file  . Number of children: Not on file  . Years of education: Not on file  . Highest education level: Not on file  Occupational History  . Not on file  Social Needs  . Financial resource strain: Not on file  . Food insecurity:    Worry: Not on file    Inability: Not on file  . Transportation needs:    Medical: Not on file    Non-medical: Not on file  Tobacco Use  . Smoking status: Current Every Day Smoker    Packs/day: 0.50    Years: 30.00    Pack years: 15.00    Types: Cigarettes  . Smokeless tobacco: Never Used  Substance and Sexual Activity  .  Alcohol use: No  . Drug use: No  . Sexual activity: Yes  Lifestyle  . Physical activity:    Days per week: Not on file    Minutes per session: Not on file  . Stress: Not on file  Relationships  . Social connections:    Talks on phone: Not on file    Gets together: Not on file    Attends religious service: Not on file    Active member of club or organization: Not on file    Attends meetings of clubs or organizations: Not on file    Relationship status: Not on file  Other Topics Concern  . Not on file  Social History Narrative   Been on disability since age 23 due to a stroke.    Used to work at [employer].    Married. No children. Married for 15 years.   Smoke cigarettes, since age 24 years.    No alcohol. No drugs.   Enjoys fishing.    Attends church.   Eats all foods.   Wear seatbelt.    Drives.    Current Outpatient Medications on File Prior to Visit  Medication Sig Dispense Refill  . aspirin EC 81 MG tablet Take 1 tablet (81 mg total) by mouth daily.     . Aspirin-Salicylamide-Caffeine (ARTHRITIS STRENGTH BC POWDER PO) Take by mouth.    Marland Kitchen atorvastatin (LIPITOR) 20 MG tablet Take 1 tablet (20 mg total) by mouth daily. 90 tablet 3  . losartan (COZAAR) 100 MG tablet Take 1 tablet (100 mg total) by mouth daily. 90 tablet 3  . omeprazole (PRILOSEC) 20 MG capsule Take 20 mg by mouth daily.     No current facility-administered medications on file prior to visit.     Review of Systems  Constitutional: Negative for appetite change, chills, fever and unexpected weight change.  HENT: Negative for trouble swallowing and voice change.   Eyes: Negative for visual disturbance.  Respiratory: Negative for cough, chest tightness, shortness of breath and wheezing.   Cardiovascular: Negative for chest pain, palpitations and leg swelling.  Gastrointestinal: Negative for abdominal pain, diarrhea, nausea and vomiting.  Genitourinary: Negative for decreased urine volume, dysuria and frequency.  Musculoskeletal: Negative for myalgias.  Skin: Negative for rash.  Neurological: Negative for dizziness, tremors, syncope, facial asymmetry, speech difficulty, weakness, light-headedness, numbness and headaches.  Hematological: Negative for adenopathy. Does not bruise/bleed easily.  Psychiatric/Behavioral: Negative for agitation, behavioral problems, dysphoric mood and sleep disturbance. The patient is not nervous/anxious.      Objective:   BP (!) 170/90 (BP Location: Left Arm, Patient Position: Sitting, Cuff Size: Large)   Pulse 87   Temp 97.6 F (36.4 C) (Temporal)   Resp 12   Ht 6\' 2"  (1.88 m)   Wt 272 lb 0.6 oz (123.4 kg)   SpO2 96% Comment: room air  BMI 34.93 kg/m   Physical Exam  Constitutional: He is oriented to person, place, and time. He appears well-developed and well-nourished.  HENT:  Head: Normocephalic and atraumatic.  Mouth/Throat: Oropharynx is clear and moist.  Eyes: Pupils are equal, round, and reactive to light. Conjunctivae and EOM are  normal. No scleral icterus.  Neck: Normal range of motion. Neck supple. No thyromegaly present.  Cardiovascular: Normal rate, regular rhythm and normal heart sounds.  Pulmonary/Chest: Effort normal and breath sounds normal. He has no wheezes.  Musculoskeletal: He exhibits no edema.  Neurological: He is alert and oriented to person, place, and time. No cranial nerve deficit.  Skin: Skin is warm, dry and intact. Capillary refill takes less than 2 seconds.  Psychiatric: He has a normal mood and affect. His behavior is normal. Thought content normal.  Vitals reviewed.  Depression screen [facility] 2/9 08/16/2018 05/31/2018 05/05/2018 04/11/2018 03/08/2018  Decreased Interest 3 0 0 0 0  Down, Depressed, Hopeless 1 0 0 0 0  PHQ - 2 Score 4 0 0 0 0  Altered sleeping 1 - - - -  Tired, decreased energy 2 - - - -  Change in appetite 3 - - - -  Feeling bad or failure about yourself  1 - - - -  Trouble concentrating 2 - - - -  Moving slowly or fidgety/restless 2 - - - -  Suicidal thoughts 1 - - - -  PHQ-9 Score 16 - - - -  Difficult doing work/chores Not difficult at all - - - -   Patient reports that he is not depressed.  He tells me that he just gave answers to those questions and was not really thinking about it.  He reports he does not feel down, depressed, or hopeless.  He reports his energy is unchanged and may be even better.  He then tells me that his appetite is good.  He denies any trouble with concentration. Assessment and Plan   1. HTN, goal below 140/90 Patient adamantly reports he is taking the medication.  However, his wife brings in the prescription bottle filled in June which is entirely full -1 pill.  Will add amlodipine 10 mg p.o. daily.  Continue losartan 100 mg p.o. daily.  Check electrolytes today.  He did not get his blood work done at last visit which was recommended.  His labs did show elevated creatinine.  We discussed that this could be a side effect of medication or could be due to  kidney damage as a result of uncontrolled blood pressure.  He was asked to recheck this today.  He reports he will go get the labs done at this time. - amLODipine (NORVASC) 10 MG tablet; Take 1 tablet (10 mg total) by mouth daily.  Dispense: 90 tablet; Refill: 0  2. Tobacco abuse The 5 A's Model for treating Tobacco Use and Dependence was used today. I have identified and documented tobacco use status for this patient. I have urged the patient to quit tobacco use. At this time, the patient is unwilling and not ready to attempt to quit. I have provided patient with information regarding risks, cessation techniques, and interventions that might increase future attempts to quit smoking. I will plan on again addressing tobacco dependence at the next visit.   3. Hyperlipidemia LDL goal <70 Hyperlipidemia and the associated risk of ASCVD were discussed today. Primary vs. Secondary prevention of ASCVD were discussed and how it relates to patient morbidity, mortality, and quality of life. Shared decision making with patient including the risks of statins vs.benefits of ASCVD risk reduction discussed.  Risks of stains discussed including myopathy, rhabdomyoloysis, liver problems, increased risk of diabetes discussed. We discussed heart healthy diet, lifestyle modifications, risk factor modifications, and adherence to the recommended treatment plan. We discussed the need to periodically monitor lipid panel and liver function tests while on statin therapy.   Lifestyle modifications discussed with patient including a diet emphasizing vegetables, fruits, and whole grains. Limiting intake of sodium to less than 2,400 mg per day.  Recommendations discussed include consuming low-fat dairy products, poultry, fish, legumes, non-tropical vegetable oils, and nuts; and limiting intake  of sweets, sugar-sweetened beverages, and red meat. Discussed following a plan such as the Dietary Approaches to Stop Hypertension (DASH) diet.  Patient to read up on this diet.    4. Need for immunization against influenza - Flu Vaccine QUAD 36+ mos IM  Yet again today, we did discuss medication compliance.  Had discussion with his wife also regarding his compliance with his medication.  I discussed with patient that uncontrolled blood pressure does increase his risk for heart attack, stroke, and other cardiovascular complications.  He was also counseled regarding his cigarette use and increased risk of complications secondary to that. If he does take his medications as directed he was instructed to follow-up within 2 weeks.  He was told to call with any questions or concerns. Return in about 2 weeks (around 08/30/2018) for BP. Caren Macadam, MD 08/16/2018

## 2018-08-17 ENCOUNTER — Encounter: Payer: Self-pay | Admitting: Family Medicine

## 2018-08-17 LAB — LIPID PANEL
CHOLESTEROL: 159 mg/dL (ref <200)
HDL: 33 mg/dL — ABNORMAL LOW (ref >40)
LDL Cholesterol (Calc): 97 mg/dL (calc)
Non-HDL Cholesterol (Calc): 126 mg/dL (calc) (ref <130)
TRIGLYCERIDES: 191 mg/dL — AB (ref <150)
Total CHOL/HDL Ratio: 4.8 (calc) (ref <5.0)

## 2018-08-17 LAB — COMPLETE METABOLIC PANEL WITH GFR
AG Ratio: 1 (calc) (ref 1.0–2.5)
ALT: 11 U/L (ref 9–46)
AST: 17 U/L (ref 10–35)
Albumin: 3.8 g/dL (ref 3.6–5.1)
Alkaline phosphatase (APISO): 60 U/L (ref 40–115)
BUN/Creatinine Ratio: 10 (calc) (ref 6–22)
BUN: 15 mg/dL (ref 7–25)
CALCIUM: 9.5 mg/dL (ref 8.6–10.3)
CO2: 28 mmol/L (ref 20–32)
Chloride: 104 mmol/L (ref 98–110)
Creat: 1.5 mg/dL — ABNORMAL HIGH (ref 0.70–1.25)
GFR, EST AFRICAN AMERICAN: 57 mL/min/{1.73_m2} — AB (ref >=60)
GFR, EST NON AFRICAN AMERICAN: 50 mL/min/{1.73_m2} — AB (ref >=60)
GLOBULIN: 3.7 g/dL (ref 1.9–3.7)
Glucose, Bld: 101 mg/dL — ABNORMAL HIGH (ref 65–99)
POTASSIUM: 4.4 mmol/L (ref 3.5–5.3)
Sodium: 139 mmol/L (ref 135–146)
TOTAL PROTEIN: 7.5 g/dL (ref 6.1–8.1)
Total Bilirubin: 0.6 mg/dL (ref 0.2–1.2)

## 2018-08-17 LAB — HEPATIC FUNCTION PANEL
AG RATIO: 1 (calc) (ref 1.0–2.5)
ALT: 11 U/L (ref 9–46)
AST: 17 U/L (ref 10–35)
Albumin: 3.8 g/dL (ref 3.6–5.1)
Alkaline phosphatase (APISO): 60 U/L (ref 40–115)
Bilirubin, Direct: 0.1 mg/dL (ref 0.0–0.2)
GLOBULIN: 3.7 g/dL (ref 1.9–3.7)
Indirect Bilirubin: 0.5 mg/dL (calc) (ref 0.2–1.2)
TOTAL PROTEIN: 7.5 g/dL (ref 6.1–8.1)
Total Bilirubin: 0.6 mg/dL (ref 0.2–1.2)

## 2018-08-23 ENCOUNTER — Encounter (HOSPITAL_BASED_OUTPATIENT_CLINIC_OR_DEPARTMENT_OTHER): Payer: Self-pay | Admitting: *Deleted

## 2018-08-23 ENCOUNTER — Other Ambulatory Visit: Payer: Self-pay

## 2018-08-24 ENCOUNTER — Encounter (HOSPITAL_COMMUNITY)
Admission: RE | Admit: 2018-08-24 | Discharge: 2018-08-24 | Disposition: A | Discharge status: Home / Self Care | Payer: Medicare HMO | Source: Ambulatory Visit | Attending: Otolaryngology | Admitting: Otolaryngology

## 2018-08-24 DIAGNOSIS — R9431 Abnormal electrocardiogram [ECG] [EKG]: Secondary | ICD-10-CM | POA: Insufficient documentation

## 2018-08-24 DIAGNOSIS — I10 Essential (primary) hypertension: Secondary | ICD-10-CM | POA: Diagnosis not present

## 2018-08-24 DIAGNOSIS — Z01810 Encounter for preprocedural cardiovascular examination: Secondary | ICD-10-CM | POA: Diagnosis not present

## 2018-08-24 DIAGNOSIS — Z01812 Encounter for preprocedural laboratory examination: Secondary | ICD-10-CM | POA: Insufficient documentation

## 2018-08-25 ENCOUNTER — Encounter (HOSPITAL_BASED_OUTPATIENT_CLINIC_OR_DEPARTMENT_OTHER): Payer: Self-pay

## 2018-08-29 ENCOUNTER — Encounter (HOSPITAL_BASED_OUTPATIENT_CLINIC_OR_DEPARTMENT_OTHER)
Admission: RE | Disposition: A | Discharge status: Home / Self Care | Payer: Self-pay | Source: Ambulatory Visit | Attending: Otolaryngology

## 2018-08-29 ENCOUNTER — Ambulatory Visit (HOSPITAL_BASED_OUTPATIENT_CLINIC_OR_DEPARTMENT_OTHER): Payer: Medicare HMO | Admitting: Anesthesiology

## 2018-08-29 ENCOUNTER — Ambulatory Visit (HOSPITAL_BASED_OUTPATIENT_CLINIC_OR_DEPARTMENT_OTHER)
Admission: RE | Admit: 2018-08-29 | Discharge: 2018-08-29 | Disposition: A | Discharge status: Home / Self Care | Payer: Medicare HMO | Source: Ambulatory Visit | Attending: Otolaryngology | Admitting: Otolaryngology

## 2018-08-29 ENCOUNTER — Other Ambulatory Visit: Payer: Self-pay

## 2018-08-29 ENCOUNTER — Encounter (HOSPITAL_BASED_OUTPATIENT_CLINIC_OR_DEPARTMENT_OTHER): Payer: Self-pay | Admitting: Anesthesiology

## 2018-08-29 DIAGNOSIS — I10 Essential (primary) hypertension: Secondary | ICD-10-CM | POA: Diagnosis not present

## 2018-08-29 DIAGNOSIS — Z8673 Personal history of transient ischemic attack (TIA), and cerebral infarction without residual deficits: Secondary | ICD-10-CM | POA: Insufficient documentation

## 2018-08-29 DIAGNOSIS — F17210 Nicotine dependence, cigarettes, uncomplicated: Secondary | ICD-10-CM | POA: Insufficient documentation

## 2018-08-29 DIAGNOSIS — K219 Gastro-esophageal reflux disease without esophagitis: Secondary | ICD-10-CM | POA: Diagnosis not present

## 2018-08-29 DIAGNOSIS — J381 Polyp of vocal cord and larynx: Secondary | ICD-10-CM | POA: Insufficient documentation

## 2018-08-29 DIAGNOSIS — M1990 Unspecified osteoarthritis, unspecified site: Secondary | ICD-10-CM | POA: Diagnosis not present

## 2018-08-29 DIAGNOSIS — J338 Other polyp of sinus: Secondary | ICD-10-CM | POA: Diagnosis not present

## 2018-08-29 DIAGNOSIS — R490 Dysphonia: Secondary | ICD-10-CM | POA: Diagnosis not present

## 2018-08-29 HISTORY — DX: Polyp of vocal cord and larynx: J38.1

## 2018-08-29 HISTORY — PX: MICROLARYNGOSCOPY: SHX5208

## 2018-08-29 SURGERY — MICROLARYNGOSCOPY
Anesthesia: General | Site: Throat

## 2018-08-29 MED ORDER — FENTANYL CITRATE (PF) 100 MCG/2ML IJ SOLN
INTRAMUSCULAR | Status: AC
  Filled 2018-08-29: qty 2

## 2018-08-29 MED ORDER — MIDAZOLAM HCL 2 MG/2ML IJ SOLN
INTRAMUSCULAR | Status: AC
  Filled 2018-08-29: qty 2

## 2018-08-29 MED ORDER — FENTANYL CITRATE (PF) 100 MCG/2ML IJ SOLN: 25.0000 ug | INTRAMUSCULAR | Status: DC | PRN

## 2018-08-29 MED ORDER — SCOPOLAMINE 1 MG/3DAYS TD PT72: 1.0000 | MEDICATED_PATCH | Freq: Once | TRANSDERMAL | Status: DC | PRN

## 2018-08-29 MED ORDER — PROPOFOL 10 MG/ML IV BOLUS
INTRAVENOUS | Status: DC | PRN
  Administered 2018-08-29: 200 mg via INTRAVENOUS

## 2018-08-29 MED ORDER — SUGAMMADEX SODIUM 200 MG/2ML IV SOLN
INTRAVENOUS | Status: DC | PRN
  Administered 2018-08-29: 200 mg via INTRAVENOUS

## 2018-08-29 MED ORDER — ONDANSETRON HCL 4 MG/2ML IJ SOLN
INTRAMUSCULAR | Status: DC | PRN
  Administered 2018-08-29: 4 mg via INTRAVENOUS

## 2018-08-29 MED ORDER — CEFAZOLIN SODIUM-DEXTROSE 2-3 GM-%(50ML) IV SOLR
INTRAVENOUS | Status: DC | PRN
  Administered 2018-08-29: 2 g via INTRAVENOUS

## 2018-08-29 MED ORDER — MIDAZOLAM HCL 2 MG/2ML IJ SOLN
1.0000 mg | INTRAMUSCULAR | Status: DC | PRN
  Administered 2018-08-29: 2 mg via INTRAVENOUS

## 2018-08-29 MED ORDER — FENTANYL CITRATE (PF) 100 MCG/2ML IJ SOLN
50.0000 ug | INTRAMUSCULAR | Status: DC | PRN
  Administered 2018-08-29 (×2): 50 ug via INTRAVENOUS

## 2018-08-29 MED ORDER — ROCURONIUM BROMIDE 50 MG/5ML IV SOSY
PREFILLED_SYRINGE | INTRAVENOUS | Status: DC | PRN
  Administered 2018-08-29: 30 mg via INTRAVENOUS

## 2018-08-29 MED ORDER — CEFAZOLIN SODIUM 1 G IJ SOLR
INTRAMUSCULAR | Status: AC
  Filled 2018-08-29: qty 20

## 2018-08-29 MED ORDER — DEXAMETHASONE SODIUM PHOSPHATE 10 MG/ML IJ SOLN
INTRAMUSCULAR | Status: AC
  Filled 2018-08-29: qty 1

## 2018-08-29 MED ORDER — LIDOCAINE 2% (20 MG/ML) 5 ML SYRINGE
INTRAMUSCULAR | Status: DC | PRN
  Administered 2018-08-29: 100 mg via INTRAVENOUS

## 2018-08-29 MED ORDER — PHENYLEPHRINE 40 MCG/ML (10ML) SYRINGE FOR IV PUSH (FOR BLOOD PRESSURE SUPPORT)
PREFILLED_SYRINGE | INTRAVENOUS | Status: DC | PRN
  Administered 2018-08-29 (×4): 80 ug via INTRAVENOUS

## 2018-08-29 MED ORDER — ONDANSETRON HCL 4 MG/2ML IJ SOLN
INTRAMUSCULAR | Status: AC
  Filled 2018-08-29: qty 2

## 2018-08-29 MED ORDER — LACTATED RINGERS IV SOLN
INTRAVENOUS | Status: DC
  Administered 2018-08-29: 10:00:00 via INTRAVENOUS

## 2018-08-29 MED ORDER — DEXAMETHASONE SODIUM PHOSPHATE 4 MG/ML IJ SOLN
INTRAMUSCULAR | Status: DC | PRN
  Administered 2018-08-29: 10 mg via INTRAVENOUS

## 2018-08-29 MED ORDER — OXYCODONE HCL 5 MG PO TABS: 5.0000 mg | ORAL_TABLET | Freq: Once | ORAL | Status: DC | PRN

## 2018-08-29 MED ORDER — OXYCODONE HCL 5 MG/5ML PO SOLN: 5.0000 mg | Freq: Once | ORAL | Status: DC | PRN

## 2018-08-29 MED ORDER — SUCCINYLCHOLINE CHLORIDE 20 MG/ML IJ SOLN
INTRAMUSCULAR | Status: DC | PRN
  Administered 2018-08-29: 100 mg via INTRAVENOUS

## 2018-08-29 MED ORDER — LIDOCAINE 2% (20 MG/ML) 5 ML SYRINGE
INTRAMUSCULAR | Status: AC
  Filled 2018-08-29: qty 5

## 2018-08-29 SURGICAL SUPPLY — 26 items
ADAPTER TUBE FLEX ULTRASET (MISCELLANEOUS) IMPLANT
CANISTER SUCT 1200ML W/VALVE (MISCELLANEOUS) ×4 IMPLANT
CONT SPEC 4OZ CLIKSEAL STRL BL (MISCELLANEOUS) IMPLANT
GAUZE SPONGE 4X4 12PLY STRL LF (GAUZE/BANDAGES/DRESSINGS) ×4 IMPLANT
GLOVE BIO SURGEON STRL SZ7.5 (GLOVE) ×4 IMPLANT
GLOVE SURG SS PI 7.0 STRL IVOR (GLOVE) ×4 IMPLANT
GOWN STRL REUS W/ TWL LRG LVL3 (GOWN DISPOSABLE) ×2 IMPLANT
GOWN STRL REUS W/TWL LRG LVL3 (GOWN DISPOSABLE) ×2
GUARD TEETH (MISCELLANEOUS) ×4 IMPLANT
MARKER SKIN DUAL TIP RULER LAB (MISCELLANEOUS) IMPLANT
NEEDLE HYPO 18GX1.5 BLUNT FILL (NEEDLE) IMPLANT
NEEDLE SPNL 22GX7 QUINCKE BK (NEEDLE) IMPLANT
NEEDLE SPNL 25GX3.5 QUINCKE BL (NEEDLE) IMPLANT
NS IRRIG 1000ML POUR BTL (IV SOLUTION) ×4 IMPLANT
PACK BASIN DAY SURGERY FS (CUSTOM PROCEDURE TRAY) ×4 IMPLANT
PATTIES SURGICAL .5 X3 (DISPOSABLE) ×4 IMPLANT
SHEET MEDIUM DRAPE 40X70 STRL (DRAPES) ×4 IMPLANT
SLEEVE SCD COMPRESS KNEE MED (MISCELLANEOUS) ×4 IMPLANT
SOLUTION BUTLER CLEAR DIP (MISCELLANEOUS) ×4 IMPLANT
SURGILUBE 2OZ TUBE FLIPTOP (MISCELLANEOUS) IMPLANT
SYR 3ML 23GX1 SAFETY (SYRINGE) ×4 IMPLANT
SYR CONTROL 10ML LL (SYRINGE) IMPLANT
SYR TB 1ML LL NO SAFETY (SYRINGE) IMPLANT
TOWEL GREEN STERILE FF (TOWEL DISPOSABLE) ×4 IMPLANT
TUBE CONNECTING 20'X1/4 (TUBING) ×1
TUBE CONNECTING 20X1/4 (TUBING) ×3 IMPLANT

## 2018-08-29 NOTE — Anesthesia Postprocedure Evaluation (Signed)
Anesthesia Post Note  Patient: Derrick Gaines  Procedure(s) Performed: DIRECT MICROLARYNGOSCOPY WITH EXCISION OF LARYNGEAL MASS (N/A Throat)     Patient location during evaluation: PACU Anesthesia Type: General Level of consciousness: awake and alert Pain management: pain level controlled Vital Signs Assessment: post-procedure vital signs reviewed and stable Respiratory status: spontaneous breathing, nonlabored ventilation and respiratory function stable Cardiovascular status: blood pressure returned to baseline and stable Postop Assessment: no apparent nausea or vomiting Anesthetic complications: no    Last Vitals:  Vitals:   08/29/18 1155 08/29/18 1219  BP:  131/86  Pulse: 78 80  Resp: 16 16  Temp:  36.6 C  SpO2: 96% 96%    Last Pain:  Vitals:   08/29/18 1219  TempSrc:   PainSc: 0-No pain                 Lynda Rainwater

## 2018-08-29 NOTE — H&P (Signed)
Cc: Chronic hoarseness  HPI: The patient is a 62 year old male who presents today for evaluation of his chronic hoarseness.  The patient is seen in consultation requested by [facility].  According to the patient, he has been hoarse for several decades.  He denies any dysphagia, odynophagia or dyspnea.  He is a long-term smoker.  He has been using tobacco since 62 years of age.  He had a tumor removed from his chest cavity last year at [facility].  He denies any change in his voice before or after the surgery. He has had no previous laryngeal surgery.  He denies any recent weight loss.    The patient's review of systems (constitutional, eyes, ENT, cardiovascular, respiratory, GI, musculoskeletal, skin, neurologic, psychiatric, endocrine, hematologic, allergic) is noted in the ROS questionnaire.  It is reviewed with the patient.   Family health history: Diabetes, cancer.   Major events: Back surgery.   Ongoing medical problems: Arthritis, stroke, hypertension, cancer.   Social history: The patient is married. He smokes about one pack of cigarettes a day. He denies the use of alcohol or illegal drugs.  Exam: General: Communicates without difficulty, well nourished, no acute distress. Head: Normocephalic, no evidence injury, no tenderness, facial buttresses intact without stepoff. Face/sinus: No tenderness to palpation and percussion. Facial movement is normal and symmetric. Eyes: PERRL, EOMI. No scleral icterus, conjunctivae clear. Neuro: CN II exam reveals vision grossly intact.  No nystagmus at any point of gaze. Ears: Auricles well formed without lesions.  Ear canals are intact without mass or lesion.  No erythema or edema is appreciated.  The TMs are intact without fluid. Nose: External evaluation reveals normal support and skin without lesions.  Dorsum is intact.  Anterior rhinoscopy reveals healthy pink mucosa over anterior aspect of inferior turbinates and intact septum.  No purulence  noted. Oral:  Oral cavity and oropharynx are intact, symmetric, without erythema or edema.  Mucosa is moist without lesions. Neck: Full range of motion without pain.  There is no significant lymphadenopathy.  No masses palpable.  Thyroid bed within normal limits to palpation.  Parotid glands and submandibular glands equal bilaterally without mass.  Trachea is midline. Neuro:  CN 2-12 grossly intact. Gait normal.   Procedure:  Flexible Fiberoptic Laryngoscopy Risks, benefits, and alternatives of flexible endoscopy were explained to the patient.  Specific mention was made of the risk of throat numbness with difficulty swallowing, possible bleeding from the nose and mouth, and pain from the procedure.  The patient gave oral consent to proceed.  The nasal cavities were decongested and anesthetised with a combination of oxymetazoline and 4% lidocaine solution.  The flexible scope was inserted into the right nasal cavity and advanced towards the nasopharynx.  Visualized mucosa over the turbinates and septum were as described above.  The nasopharynx was clear.  Oropharyngeal walls were symmetric and mobile without lesion, mass, or edema.  Hypopharynx was also without  lesion or edema.  Larynx was mobile without lesions. Supraglottic structures were free of edema, mass, and asymmetry.  A small polypoid tissue was noted at the left anterior vocal cord. His vocal cords were mobile bilaterally.   Base of tongue was within normal limits.   Assessment 1.  The patient is noted to have small polypoid tissue at the left anterior vocal cord. His vocal cords are mobile bilaterally.  No other mass or lesion is noted today.  2.  The vocal cord polyp is likely the cause of his chronic hoarseness.  Plan  1.  The physical exam and laryngoscopy findings are reviewed with the patient.  2.  Based on the above findings, we will proceed with microdirect laryngoscopy with excision of the vocal cord polyp.  3.  The risks, benefits,  and details of the procedure are reviewed with the patient.  4.  The patient would like to proceed with the procedure.

## 2018-08-29 NOTE — Anesthesia Procedure Notes (Signed)
Procedure Name: Intubation Performed by: Oleta Mouse, MD Pre-anesthesia Checklist: Patient identified, Emergency Drugs available, Suction available, Patient being monitored and Timeout performed Patient Re-evaluated:Patient Re-evaluated prior to induction Oxygen Delivery Method: Circle system utilized Preoxygenation: Pre-oxygenation with 100% oxygen Induction Type: IV induction Ventilation: Mask ventilation without difficulty Laryngoscope Size: Mac and 3 (Mil 2 DL x 1 by CRNA, MAC 3 DL x 1 by MD) Grade View: Grade II Tube type: Oral Tube size: 7.0 mm Number of attempts: 2 Airway Equipment and Method: Stylet Placement Confirmation: ETT inserted through vocal cords under direct vision,  positive ETCO2 and breath sounds checked- equal and bilateral Secured at: 22 cm Tube secured with: Tape Dental Injury: Teeth and Oropharynx as per pre-operative assessment

## 2018-08-29 NOTE — Anesthesia Procedure Notes (Signed)
Procedure Name: Intubation Date/Time: 08/29/2018 10:43 AM Performed by: Lieutenant Diego, CRNA Pre-anesthesia Checklist: Patient identified, Timeout performed, Emergency Drugs available, Suction available and Patient being monitored Patient Re-evaluated:Patient Re-evaluated prior to induction Oxygen Delivery Method: Circle system utilized Preoxygenation: Pre-oxygenation with 100% oxygen Induction Type: IV induction Laryngoscope Size: Miller and 2 Grade View: Grade II Number of attempts: 1 Difficulty Due To: Difficulty was unanticipated, Difficult Airway- due to anterior larynx, Difficult Airway- due to dentition and Difficult Airway- due to large tongue

## 2018-08-29 NOTE — Op Note (Signed)
DATE OF PROCEDURE:  08/29/2018                              OPERATIVE REPORT  SURGEON:  Leta Baptist, MD  PREOPERATIVE DIAGNOSES: 1. Chronic hoarseness 2. Vocal cord polyp  POSTOPERATIVE DIAGNOSES: 1. Chronic hoarseness 2. Left vocal cord polyp  PROCEDURE PERFORMED:  MicroDirect laryngoscopy with excision of a left vocal cord polyp  ANESTHESIA:  General endotracheal tube anesthesia.  COMPLICATIONS:  None.  ESTIMATED BLOOD LOSS:  Minimal.  INDICATION FOR PROCEDURE:  Derrick Gaines is a 62 y.o. male with a history of chronic hoarseness for several decades. He has a history of long-term tobacco use. On his flexible laryngoscopy examination, he was noted to have a left vocal cord polyp. Based on the above findings, the decision was made for the patient to undergo the above-stated procedure. Likelihood of success in reducing symptoms was also discussed.  The risks, benefits, alternatives, and details of the procedure were discussed with the patient.  Questions were invited and answered.  Informed consent was obtained.  DESCRIPTION:  The patient was taken to the operating room and placed supine on the operating table.  General endotracheal tube anesthesia was administered by the anesthesiologist.  The patient was positioned and prepped and draped in a standard fashion for direct laryngoscopy. A mouth guard was used to protect the dentition.  An anterior commissure laryngoscope was used for examination. The laryngoscope was inserted via the oral cavity into the pharynx. Examination of the epiglottis, vallecula, aryepiglottic folds, and piriform sinuses wall normal. Examination of the glottis showed a 1 cm polypoid tissue overlying the left false vocal fold. No other mass or lesion was noted. The laryngoscope was suspended with a Lewy suspender. Photodocumentation of the findings was obtained.   An operating microscope was brought into the field. Under the operating microscope, the left laryngeal polyp  was excised using a combination of laryngeal scissors and laryngeal forceps. The specimen was sent to the pathology department for permanent histologic identification. The laryngoscope was withdrawn.   The care of the patient was turned over to the anesthesiologist.  The patient was awakened from anesthesia without difficulty.  The patient was extubated and transferred to the recovery room in good condition.  OPERATIVE FINDINGS:  A 1 cm left false vocal fold polyp was noted.  SPECIMEN:  Laryngeal polyp.  FOLLOWUP CARE:  The patient will be discharged home once awake and alert.   The patient will follow up in my office in approximately 1 week.  Derrick Gaines 08/29/2018 11:15 AM

## 2018-08-29 NOTE — Discharge Instructions (Addendum)
The patient may resume all his previous activities and diet. He will follow-up in my [place] office in one week.   Post Anesthesia Home Care Instructions  Activity: Get plenty of rest for the remainder of the day. A responsible individual must stay with you for 24 hours following the procedure.  For the next 24 hours, DO NOT: -Drive a car -Paediatric nurse -Drink alcoholic beverages -Take any medication unless instructed by your physician -Make any legal decisions or sign important papers.  Meals: Start with liquid foods such as gelatin or soup. Progress to regular foods as tolerated. Avoid greasy, spicy, heavy foods. If nausea and/or vomiting occur, drink only clear liquids until the nausea and/or vomiting subsides. Call your physician if vomiting continues.  Special Instructions/Symptoms: Your throat may feel dry or sore from the anesthesia or the breathing tube placed in your throat during surgery. If this causes discomfort, gargle with warm salt water. The discomfort should disappear within 24 hours.  If you had a scopolamine patch placed behind your ear for the management of post- operative nausea and/or vomiting:  1. The medication in the patch is effective for 72 hours, after which it should be removed.  Wrap patch in a tissue and discard in the trash. Wash hands thoroughly with soap and water. 2. You may remove the patch earlier than 72 hours if you experience unpleasant side effects which may include dry mouth, dizziness or visual disturbances. 3. Avoid touching the patch. Wash your hands with soap and water after contact with the patch.   Call your surgeon if you experience:   1.  Fever over 101.0. 2.  Inability to urinate. 3.  Nausea and/or vomiting. 4.  Extreme swelling or bruising at the surgical site. 5.  Continued bleeding from the incision. 6.  Increased pain, redness or drainage from the incision. 7.  Problems related to your pain medication. 8.  Any problems  and/or concerns

## 2018-08-29 NOTE — Transfer of Care (Signed)
Immediate Anesthesia Transfer of Care Note  Patient: Derrick Gaines  Procedure(s) Performed: DIRECT MICROLARYNGOSCOPY WITH EXCISION OF LARYNGEAL MASS (N/A Throat)  Patient Location: PACU  Anesthesia Type:General  Level of Consciousness: sedated  Airway & Oxygen Therapy: Patient Spontanous Breathing and Patient connected to face mask oxygen  Post-op Assessment: Report given to RN and Post -op Vital signs reviewed and stable  Post vital signs: Reviewed and stable  Last Vitals:  Vitals Value Taken Time  BP    Temp    Pulse 77 08/29/2018 11:24 AM  Resp 22 08/29/2018 11:24 AM  SpO2 99 % 08/29/2018 11:24 AM  Vitals shown include unvalidated device data.  Last Pain:  Vitals:   08/29/18 0954  TempSrc: Oral  PainSc: 0-No pain      Patients Stated Pain Goal: 0 (17/12/78 7183)  Complications: No apparent anesthesia complications

## 2018-08-29 NOTE — Anesthesia Preprocedure Evaluation (Addendum)
Anesthesia Evaluation  Patient identified by MRN, date of birth, ID band Patient awake    Reviewed: Allergy & Precautions, NPO status , Patient's Chart, lab work & pertinent test results  History of Anesthesia Complications Negative for: history of anesthetic complications  Airway Mallampati: III  TM Distance: >3 FB Neck ROM: Full    Dental  (+) Teeth Intact,    Pulmonary Current Smoker,    breath sounds clear to auscultation       Cardiovascular hypertension,  Rhythm:Regular Rate:Normal     Neuro/Psych CVA, No Residual Symptoms negative psych ROS   GI/Hepatic Neg liver ROS, GERD  Medicated,  Endo/Other  negative endocrine ROS  Renal/GU Renal InsufficiencyRenal disease     Musculoskeletal  (+) Arthritis ,   Abdominal   Peds  Hematology negative hematology ROS (+)   Anesthesia Other Findings Medically non-compliant  Reproductive/Obstetrics negative OB ROS                           Anesthesia Physical Anesthesia Plan  ASA: III  Anesthesia Plan: General   Post-op Pain Management:    Induction: Intravenous  PONV Risk Score and Plan: 1 and Ondansetron and Dexamethasone  Airway Management Planned: Oral ETT  Additional Equipment: None  Intra-op Plan:   Post-operative Plan: Extubation in OR  Informed Consent: I have reviewed the patients History and Physical, chart, labs and discussed the procedure including the risks, benefits and alternatives for the proposed anesthesia with the patient or authorized representative who has indicated his/her understanding and acceptance.   Dental advisory given  Plan Discussed with: CRNA  Anesthesia Plan Comments:         Anesthesia Quick Evaluation

## 2018-08-30 ENCOUNTER — Encounter (HOSPITAL_BASED_OUTPATIENT_CLINIC_OR_DEPARTMENT_OTHER): Payer: Self-pay | Admitting: Otolaryngology

## 2018-09-04 ENCOUNTER — Ambulatory Visit (INDEPENDENT_AMBULATORY_CARE_PROVIDER_SITE_OTHER): Payer: Medicare HMO | Admitting: Otolaryngology

## 2018-09-18 ENCOUNTER — Ambulatory Visit (INDEPENDENT_AMBULATORY_CARE_PROVIDER_SITE_OTHER): Payer: Medicare HMO | Admitting: Otolaryngology

## 2018-09-18 DIAGNOSIS — R490 Dysphonia: Secondary | ICD-10-CM | POA: Diagnosis not present

## 2018-09-18 DIAGNOSIS — J381 Polyp of vocal cord and larynx: Secondary | ICD-10-CM

## 2018-09-18 DIAGNOSIS — K210 Gastro-esophageal reflux disease with esophagitis: Secondary | ICD-10-CM | POA: Diagnosis not present

## 2018-12-04 ENCOUNTER — Inpatient Hospital Stay (HOSPITAL_COMMUNITY): Payer: Medicare HMO | Attending: Hematology

## 2018-12-04 ENCOUNTER — Ambulatory Visit (HOSPITAL_COMMUNITY)
Admission: RE | Admit: 2018-12-04 | Discharge: 2018-12-04 | Disposition: A | Discharge status: Home / Self Care | Payer: Medicare HMO | Source: Ambulatory Visit | Attending: Hematology | Admitting: Hematology

## 2018-12-04 DIAGNOSIS — D4989 Neoplasm of unspecified behavior of other specified sites: Secondary | ICD-10-CM

## 2018-12-04 DIAGNOSIS — D150 Benign neoplasm of thymus: Secondary | ICD-10-CM

## 2018-12-04 DIAGNOSIS — J439 Emphysema, unspecified: Secondary | ICD-10-CM | POA: Diagnosis not present

## 2018-12-04 DIAGNOSIS — C37 Malignant neoplasm of thymus: Secondary | ICD-10-CM | POA: Diagnosis not present

## 2018-12-04 DIAGNOSIS — Z85238 Personal history of other malignant neoplasm of thymus: Secondary | ICD-10-CM | POA: Insufficient documentation

## 2018-12-04 LAB — COMPREHENSIVE METABOLIC PANEL
ALT: 15 U/L (ref 0–44)
AST: 20 U/L (ref 15–41)
Albumin: 3.7 g/dL (ref 3.5–5.0)
Alkaline Phosphatase: 60 U/L (ref 38–126)
Anion gap: 7 (ref 5–15)
BUN: 14 mg/dL (ref 8–23)
CHLORIDE: 105 mmol/L (ref 98–111)
CO2: 25 mmol/L (ref 22–32)
CREATININE: 1.54 mg/dL — AB (ref 0.61–1.24)
Calcium: 8.8 mg/dL — ABNORMAL LOW (ref 8.9–10.3)
GFR calc Af Amer: 56 mL/min — ABNORMAL LOW (ref >60)
GFR, EST NON AFRICAN AMERICAN: 48 mL/min — AB (ref >60)
Glucose, Bld: 156 mg/dL — ABNORMAL HIGH (ref 70–99)
Potassium: 4 mmol/L (ref 3.5–5.1)
SODIUM: 137 mmol/L (ref 135–145)
Total Bilirubin: 0.6 mg/dL (ref 0.3–1.2)
Total Protein: 7.7 g/dL (ref 6.5–8.1)

## 2018-12-04 LAB — CBC WITH DIFFERENTIAL/PLATELET
ABS IMMATURE GRANULOCYTES: 0.03 10*3/uL (ref 0.00–0.07)
BASOS PCT: 1 %
Basophils Absolute: 0.1 10*3/uL (ref 0.0–0.1)
Eosinophils Absolute: 0.2 10*3/uL (ref 0.0–0.5)
Eosinophils Relative: 3 %
HCT: 46.8 % (ref 39.0–52.0)
HEMOGLOBIN: 14.7 g/dL (ref 13.0–17.0)
Immature Granulocytes: 1 %
Lymphocytes Relative: 31 %
Lymphs Abs: 1.8 10*3/uL (ref 0.7–4.0)
MCH: 32.2 pg (ref 26.0–34.0)
MCHC: 31.4 g/dL (ref 30.0–36.0)
MCV: 102.6 fL — AB (ref 80.0–100.0)
MONOS PCT: 8 %
Monocytes Absolute: 0.5 10*3/uL (ref 0.1–1.0)
Neutro Abs: 3.3 10*3/uL (ref 1.7–7.7)
Neutrophils Relative %: 56 %
PLATELETS: 294 10*3/uL (ref 150–400)
RBC: 4.56 MIL/uL (ref 4.22–5.81)
RDW: 13 % (ref 11.5–15.5)
WBC: 5.8 10*3/uL (ref 4.0–10.5)
nRBC: 0 % (ref 0.0–0.2)

## 2018-12-04 MED ORDER — IOPAMIDOL (ISOVUE-300) INJECTION 61%
100.0000 mL | Freq: Once | INTRAVENOUS | Status: AC | PRN
  Administered 2018-12-04: 75 mL via INTRAVENOUS

## 2018-12-07 ENCOUNTER — Inpatient Hospital Stay (HOSPITAL_COMMUNITY): Payer: Medicare HMO | Admitting: Hematology

## 2019-03-19 ENCOUNTER — Ambulatory Visit (INDEPENDENT_AMBULATORY_CARE_PROVIDER_SITE_OTHER): Payer: Medicare PPO | Admitting: Otolaryngology

## 2019-03-19 DIAGNOSIS — R490 Dysphonia: Secondary | ICD-10-CM

## 2019-03-19 DIAGNOSIS — J381 Polyp of vocal cord and larynx: Secondary | ICD-10-CM | POA: Diagnosis not present

## 2019-09-24 ENCOUNTER — Ambulatory Visit (INDEPENDENT_AMBULATORY_CARE_PROVIDER_SITE_OTHER): Payer: Medicare PPO | Admitting: Otolaryngology

## 2019-11-15 ENCOUNTER — Other Ambulatory Visit (HOSPITAL_COMMUNITY): Payer: Self-pay | Admitting: *Deleted

## 2019-11-15 DIAGNOSIS — D4989 Neoplasm of unspecified behavior of other specified sites: Secondary | ICD-10-CM

## 2019-11-19 ENCOUNTER — Ambulatory Visit (HOSPITAL_COMMUNITY): Payer: Medicare PPO | Admitting: Hematology

## 2019-12-04 ENCOUNTER — Inpatient Hospital Stay (HOSPITAL_COMMUNITY): Payer: Medicare PPO | Attending: Hematology

## 2019-12-04 ENCOUNTER — Ambulatory Visit (HOSPITAL_COMMUNITY)
Admission: RE | Admit: 2019-12-04 | Discharge: 2019-12-04 | Disposition: A | Discharge status: Home / Self Care | Payer: Medicare PPO | Source: Ambulatory Visit | Attending: Hematology | Admitting: Hematology

## 2019-12-04 ENCOUNTER — Other Ambulatory Visit: Payer: Self-pay

## 2019-12-04 DIAGNOSIS — J439 Emphysema, unspecified: Secondary | ICD-10-CM | POA: Diagnosis not present

## 2019-12-04 DIAGNOSIS — Z85238 Personal history of other malignant neoplasm of thymus: Secondary | ICD-10-CM | POA: Insufficient documentation

## 2019-12-04 DIAGNOSIS — I700 Atherosclerosis of aorta: Secondary | ICD-10-CM | POA: Insufficient documentation

## 2019-12-04 DIAGNOSIS — D4989 Neoplasm of unspecified behavior of other specified sites: Secondary | ICD-10-CM | POA: Diagnosis not present

## 2019-12-04 LAB — COMPREHENSIVE METABOLIC PANEL
ALT: 16 U/L (ref 0–44)
AST: 17 U/L (ref 15–41)
Albumin: 3.7 g/dL (ref 3.5–5.0)
Alkaline Phosphatase: 64 U/L (ref 38–126)
Anion gap: 9 (ref 5–15)
BUN: 15 mg/dL (ref 8–23)
CO2: 27 mmol/L (ref 22–32)
Calcium: 8.9 mg/dL (ref 8.9–10.3)
Chloride: 105 mmol/L (ref 98–111)
Creatinine, Ser: 1.56 mg/dL — ABNORMAL HIGH (ref 0.61–1.24)
GFR calc Af Amer: 54 mL/min — ABNORMAL LOW (ref >60)
GFR calc non Af Amer: 47 mL/min — ABNORMAL LOW (ref >60)
Glucose, Bld: 143 mg/dL — ABNORMAL HIGH (ref 70–99)
Potassium: 4.2 mmol/L (ref 3.5–5.1)
Sodium: 141 mmol/L (ref 135–145)
Total Bilirubin: 0.7 mg/dL (ref 0.3–1.2)
Total Protein: 7.6 g/dL (ref 6.5–8.1)

## 2019-12-04 LAB — CBC WITH DIFFERENTIAL/PLATELET
Abs Immature Granulocytes: 0.03 10*3/uL (ref 0.00–0.07)
Basophils Absolute: 0.1 10*3/uL (ref 0.0–0.1)
Basophils Relative: 1 %
Eosinophils Absolute: 0.1 10*3/uL (ref 0.0–0.5)
Eosinophils Relative: 1 %
HCT: 49.7 % (ref 39.0–52.0)
Hemoglobin: 15.5 g/dL (ref 13.0–17.0)
Immature Granulocytes: 1 %
Lymphocytes Relative: 42 %
Lymphs Abs: 2.2 10*3/uL (ref 0.7–4.0)
MCH: 32.6 pg (ref 26.0–34.0)
MCHC: 31.2 g/dL (ref 30.0–36.0)
MCV: 104.6 fL — ABNORMAL HIGH (ref 80.0–100.0)
Monocytes Absolute: 0.5 10*3/uL (ref 0.1–1.0)
Monocytes Relative: 9 %
Neutro Abs: 2.5 10*3/uL (ref 1.7–7.7)
Neutrophils Relative %: 46 %
Platelets: 312 10*3/uL (ref 150–400)
RBC: 4.75 MIL/uL (ref 4.22–5.81)
RDW: 13 % (ref 11.5–15.5)
WBC: 5.3 10*3/uL (ref 4.0–10.5)
nRBC: 0 % (ref 0.0–0.2)

## 2019-12-04 LAB — POCT I-STAT CREATININE: Creatinine, Ser: 0.9 mg/dL (ref 0.61–1.24)

## 2019-12-04 LAB — LACTATE DEHYDROGENASE: LDH: 129 U/L (ref 98–192)

## 2019-12-04 MED ORDER — IOPAMIDOL (ISOVUE-300) INJECTION 61%: 75.0000 mL | Freq: Once | INTRAVENOUS | Status: DC | PRN

## 2019-12-04 MED ORDER — IOHEXOL 300 MG/ML  SOLN
75.0000 mL | Freq: Once | INTRAMUSCULAR | Status: AC | PRN
  Administered 2019-12-04: 75 mL via INTRAVENOUS

## 2019-12-05 ENCOUNTER — Inpatient Hospital Stay (HOSPITAL_COMMUNITY): Payer: Medicare PPO | Admitting: Hematology

## 2020-01-22 DIAGNOSIS — I10 Essential (primary) hypertension: Secondary | ICD-10-CM | POA: Diagnosis not present

## 2020-01-22 DIAGNOSIS — M419 Scoliosis, unspecified: Secondary | ICD-10-CM | POA: Diagnosis not present

## 2020-01-22 DIAGNOSIS — M5432 Sciatica, left side: Secondary | ICD-10-CM | POA: Diagnosis not present

## 2020-01-22 DIAGNOSIS — I688 Other cerebrovascular disorders in diseases classified elsewhere: Secondary | ICD-10-CM | POA: Diagnosis not present

## 2020-06-19 ENCOUNTER — Other Ambulatory Visit: Payer: Self-pay

## 2020-06-19 ENCOUNTER — Inpatient Hospital Stay (HOSPITAL_COMMUNITY): Payer: Medicare PPO | Attending: Oncology | Admitting: Oncology

## 2020-06-19 VITALS — BP 184/100 | HR 76 | Temp 97.6°F | Resp 18 | Wt 256.9 lb

## 2020-06-19 DIAGNOSIS — E66811 Obesity, class 1: Secondary | ICD-10-CM

## 2020-06-19 DIAGNOSIS — Z85238 Personal history of other malignant neoplasm of thymus: Secondary | ICD-10-CM | POA: Diagnosis not present

## 2020-06-19 DIAGNOSIS — I10 Essential (primary) hypertension: Secondary | ICD-10-CM

## 2020-06-19 DIAGNOSIS — Z720 Tobacco use: Secondary | ICD-10-CM

## 2020-06-19 DIAGNOSIS — I639 Cerebral infarction, unspecified: Secondary | ICD-10-CM

## 2020-06-19 DIAGNOSIS — D4989 Neoplasm of unspecified behavior of other specified sites: Secondary | ICD-10-CM

## 2020-06-19 DIAGNOSIS — Z6833 Body mass index (BMI) 33.0-33.9, adult: Secondary | ICD-10-CM

## 2020-06-19 DIAGNOSIS — E6609 Other obesity due to excess calories: Secondary | ICD-10-CM

## 2020-06-19 NOTE — Progress Notes (Signed)
Patient, No Pcp Per No address on file  Thymoma - Plan: CANCELED: CT Chest W Contrast  Tobacco abuse  Cerebrovascular accident (CVA), unspecified mechanism ([place])  Essential hypertension  Class 1 obesity due to excess calories with serious comorbidity and body mass index (BMI) of 33.0 to 33.9 in adult   HISTORY OF PRESENT ILLNESS: Poorly differentiated thymic carcinoma: -Presentation with weight loss of 40 pounds, PET/CT scan on 10/05/2016 showed anterior mediastinal mass measuring 3.6 x 1.9 cm with SUV of 6.3, status post resection on 12/29/2016 -Pathology report dated 12/29/2016 which showed poorly differentiated thymic carcinoma, with foci of squamous differentiation, negative margin for lung tissue, negative margin for pericardial and pleura, one lymph node negative, pT2pN0M0, adjuvant chemoradiation therapy was recommended, patient declined.  -Family history with one sister died of colon cancer in her 35s, another sister with metastatic cancer at this time -We will continue surveillance for recurrence with CT of the chest with contrast every 6 months for 2 years, then annually for 5 years.  I have given an appointment.  CURRENT STATUS: Derrick Gaines 64 y.o. male returns for followup of in follow-up of poorly differentiated thymic carcinoma treated with definitive intent in 2018 and he has not been perfectly compliant with follow-up visits and surveillance recommendations.  He was last seen 2 years ago (almost to date).  His last imaging was in December 2020.  He reports "I am good."  He admits to noncompliance with his antihypertensive regimen.  He says that he takes his blood pressure sometimes.  When asked how does he know when to take his blood pressure, he shrugs his shoulders and says that most times he uses "mustard" to manage his blood pressure.  He did not take his antihypertensive treatment today and his blood pressure today is poorly controlled.  He is resistant to  laboratory work and CT imaging.  Most important to him at this point in time is "getting lunch."  He denies any chest pain or new cough.  No new lumps or bumps on my examination.  No new shortness of breath.  He denies any new pain.  Review of Systems  Constitutional: Negative.  Negative for chills, fever and weight loss.  HENT: Negative.   Eyes: Negative.   Respiratory: Negative.  Negative for cough.   Cardiovascular: Negative.  Negative for chest pain.  Gastrointestinal: Negative.  Negative for blood in stool, constipation, diarrhea, melena, nausea and vomiting.  Genitourinary: Negative.   Musculoskeletal: Negative.   Skin: Negative.   Neurological: Negative.  Negative for weakness.  Endo/Heme/Allergies: Negative.   Psychiatric/Behavioral: Negative.     Past Medical History:  Diagnosis Date  . Arthritis   . Cancer ([place])   . Chronic kidney disease   . Hypertension   . Stroke [facility])    mild left sided weakness  . Vocal cord polyp      PHYSICAL EXAMINATION  ECOG PERFORMANCE STATUS: 1 - Symptomatic but completely ambulatory  Vitals:   06/19/20 1018 06/19/20 1026  BP: (!) 169/99 (!) 184/100  Pulse: 76   Resp: 18   Temp: 97.6 F (36.4 C)   SpO2: 94%     GENERAL:alert, no distress, well nourished, well developed, comfortable, smiling and pleasant, unaccompanied SKIN: skin color, texture, turgor are normal HEAD: Normocephalic, No masses, lesions, tenderness or abnormalities EYES: normal EARS: External ears normal OROPHARYNX: Not examined, mask in place NECK: supple, no adenopathy LYMPH:  no palpable lymphadenopathy BREAST:not examined LUNGS: clear  to auscultation and percussion HEART: regular rate & rhythm, no murmurs and no gallops ABDOMEN:abdomen soft, obese and normal bowel sounds BACK: Back symmetric, no curvature. EXTREMITIES:less then 2 second capillary refill, no joint deformities, effusion, or inflammation, no edema, no cyanosis  NEURO: alert & oriented x 3  with fluent speech, no focal motor/sensory deficits, gait normal   LABORATORY DATA: CBC    Component Value Date/Time   WBC 5.3 12/04/2019 1418   RBC 4.75 12/04/2019 1418   HGB 15.5 12/04/2019 1418   HCT 49.7 12/04/2019 1418   PLT 312 12/04/2019 1418   MCV 104.6 (H) 12/04/2019 1418   MCH 32.6 12/04/2019 1418   MCHC 31.2 12/04/2019 1418   RDW 13.0 12/04/2019 1418   LYMPHSABS 2.2 12/04/2019 1418   MONOABS 0.5 12/04/2019 1418   EOSABS 0.1 12/04/2019 1418   BASOSABS 0.1 12/04/2019 1418      Chemistry      Component Value Date/Time   NA 141 12/04/2019 1418   K 4.2 12/04/2019 1418   CL 105 12/04/2019 1418   CO2 27 12/04/2019 1418   BUN 15 12/04/2019 1418   CREATININE 0.90 12/04/2019 1548   CREATININE 1.50 (H) 08/16/2018 1514      Component Value Date/Time   CALCIUM 8.9 12/04/2019 1418   ALKPHOS 64 12/04/2019 1418   AST 17 12/04/2019 1418   ALT 16 12/04/2019 1418   BILITOT 0.7 12/04/2019 1418       RADIOGRAPHIC STUDIES:  No results found.     ASSESSMENT AND PLAN:  1. Thymoma Poorly differentiated thymic carcinoma: -Presentation with weight loss of 40 pounds, PET/CT scan on 10/05/2016 showed anterior mediastinal mass measuring 3.6 x 1.9 cm with SUV of 6.3, status post resection on 12/29/2016 -Pathology report dated 12/29/2016 which showed poorly differentiated thymic carcinoma, with foci of squamous differentiation, negative margin for lung tissue, negative margin for pericardial and pleura, one lymph node negative, pT2pN0M0, adjuvant chemoradiation therapy was recommended, patient declined.  -Family history with one sister died of colon cancer in her 60s, another sister with metastatic cancer at this time -We will continue surveillance for recurrence with CT of the chest with contrast every 6 months for 2 years, then annually for 5 years.  I have given an appointment.  He was last seen in the clinic on 06/05/2018.  He is returning today.  Most recent imaging was in 11/2019  at which time there was no sign of recurrence of disease.  I have recommended labs today: CBC diff, CMET.  However, he refuses laboratory work today reporting "I am good."  He is due for surveillance imaging and therefore I have recommended CT chest w contrast within 4 weeks.  He has been declines this intervention as well.  Return in 6 months for follow-up.  He refuses CT surveillance and laboratory work today.  Hopefully he will be of different mindset in 6 months of his return visit.  "I do not like to see doctors."  2. Tobacco abuse smoking cessation education provided today.  3. Cerebrovascular accident (CVA), unspecified mechanism ([place]) ASA 81 mg daily.  4. Essential hypertension Poorly controlled today.  He did not take his anti-hypertensive treatment this AM and he admits to poor compliance with medication.  I have encouraged improved complaisance.  5. Class 1 obesity due to excess calories with serious comorbidity and body mass index (BMI) of 33.0 to 33.9 in adult BMI today is 33.9 kg/m2.     ORDERS PLACED FOR THIS ENCOUNTER: No orders  of the defined types were placed in this encounter.   MEDICATIONS PRESCRIBED THIS ENCOUNTER: No orders of the defined types were placed in this encounter.   All questions were answered. The patient knows to call the clinic with any problems, questions or concerns. We can certainly see the patient much sooner if necessary.  Patient and plan discussed with Dr. Derek Jack and he is in agreement with the aforementioned.   This note is electronically signed by: Robynn Pane, PA-C 06/19/2020 11:11 AM

## 2020-06-19 NOTE — Patient Instructions (Signed)
[place] at [facility] Discharge Instructions  You were seen today by Kirby Crigler PA. He went over how you've been feeling lately.  He will see you back 6 months in for follow up.   Thank you for choosing [place] at [facility] to provide your oncology and hematology care.  To afford each patient quality time with our provider, please arrive at least 15 minutes before your scheduled appointment time.   If you have a lab appointment with the [facility] please come in thru the  Main Entrance and check in at the main information desk  You need to re-schedule your appointment should you arrive 10 or more minutes late.  We strive to give you quality time with our providers, and arriving late affects you and other patients whose appointments are after yours.  Also, if you no show three or more times for appointments you may be dismissed from the clinic at the providers discretion.     Again, thank you for choosing [facility].  Our hope is that these requests will decrease the amount of time that you wait before being seen by our physicians.       _____________________________________________________________  Should you have questions after your visit to [facility], please contact our office at [phone] between the hours of 8:00 a.m. and 4:30 p.m.  Voicemails left after 4:00 p.m. will not be returned until the following business day.  For prescription refill requests, have your pharmacy contact our office and allow 72 hours.    Cancer Center Support Programs:   > Cancer Support Group  2nd Tuesday of the month 1pm-2pm, Journey Room

## 2020-11-03 DIAGNOSIS — I6350 Cerebral infarction due to unspecified occlusion or stenosis of unspecified cerebral artery: Secondary | ICD-10-CM | POA: Diagnosis not present

## 2020-11-03 DIAGNOSIS — I10 Essential (primary) hypertension: Secondary | ICD-10-CM | POA: Diagnosis not present

## 2020-11-10 DIAGNOSIS — Z23 Encounter for immunization: Secondary | ICD-10-CM | POA: Diagnosis not present

## 2020-11-20 ENCOUNTER — Observation Stay (HOSPITAL_COMMUNITY)
Admission: EM | Admit: 2020-11-20 | Discharge: 2020-11-21 | Disposition: A | Discharge status: Home / Self Care | Payer: Medicare PPO | Attending: Internal Medicine | Admitting: Internal Medicine

## 2020-11-20 ENCOUNTER — Other Ambulatory Visit: Payer: Self-pay

## 2020-11-20 ENCOUNTER — Emergency Department (HOSPITAL_COMMUNITY): Payer: Medicare PPO

## 2020-11-20 ENCOUNTER — Encounter (HOSPITAL_COMMUNITY): Payer: Self-pay | Admitting: Emergency Medicine

## 2020-11-20 DIAGNOSIS — R404 Transient alteration of awareness: Secondary | ICD-10-CM | POA: Diagnosis not present

## 2020-11-20 DIAGNOSIS — E876 Hypokalemia: Secondary | ICD-10-CM | POA: Diagnosis not present

## 2020-11-20 DIAGNOSIS — I10 Essential (primary) hypertension: Secondary | ICD-10-CM | POA: Diagnosis not present

## 2020-11-20 DIAGNOSIS — Z79899 Other long term (current) drug therapy: Secondary | ICD-10-CM | POA: Insufficient documentation

## 2020-11-20 DIAGNOSIS — J90 Pleural effusion, not elsewhere classified: Secondary | ICD-10-CM | POA: Diagnosis not present

## 2020-11-20 DIAGNOSIS — R739 Hyperglycemia, unspecified: Secondary | ICD-10-CM

## 2020-11-20 DIAGNOSIS — U071 COVID-19: Secondary | ICD-10-CM | POA: Diagnosis not present

## 2020-11-20 DIAGNOSIS — F17210 Nicotine dependence, cigarettes, uncomplicated: Secondary | ICD-10-CM | POA: Insufficient documentation

## 2020-11-20 DIAGNOSIS — D72819 Decreased white blood cell count, unspecified: Secondary | ICD-10-CM

## 2020-11-20 DIAGNOSIS — Z7982 Long term (current) use of aspirin: Secondary | ICD-10-CM | POA: Diagnosis not present

## 2020-11-20 DIAGNOSIS — R55 Syncope and collapse: Secondary | ICD-10-CM | POA: Diagnosis present

## 2020-11-20 DIAGNOSIS — R7989 Other specified abnormal findings of blood chemistry: Secondary | ICD-10-CM

## 2020-11-20 DIAGNOSIS — I129 Hypertensive chronic kidney disease with stage 1 through stage 4 chronic kidney disease, or unspecified chronic kidney disease: Secondary | ICD-10-CM | POA: Diagnosis not present

## 2020-11-20 DIAGNOSIS — N183 Chronic kidney disease, stage 3 unspecified: Secondary | ICD-10-CM

## 2020-11-20 DIAGNOSIS — I959 Hypotension, unspecified: Secondary | ICD-10-CM | POA: Diagnosis not present

## 2020-11-20 DIAGNOSIS — R778 Other specified abnormalities of plasma proteins: Secondary | ICD-10-CM

## 2020-11-20 DIAGNOSIS — N189 Chronic kidney disease, unspecified: Secondary | ICD-10-CM | POA: Diagnosis not present

## 2020-11-20 DIAGNOSIS — J219 Acute bronchiolitis, unspecified: Secondary | ICD-10-CM | POA: Diagnosis not present

## 2020-11-20 DIAGNOSIS — Z8673 Personal history of transient ischemic attack (TIA), and cerebral infarction without residual deficits: Secondary | ICD-10-CM | POA: Diagnosis not present

## 2020-11-20 DIAGNOSIS — E66812 Obesity, class 2: Secondary | ICD-10-CM

## 2020-11-20 DIAGNOSIS — K219 Gastro-esophageal reflux disease without esophagitis: Secondary | ICD-10-CM | POA: Diagnosis not present

## 2020-11-20 DIAGNOSIS — E669 Obesity, unspecified: Secondary | ICD-10-CM

## 2020-11-20 LAB — CBC
HCT: 42.3 % (ref 39.0–52.0)
Hemoglobin: 13.8 g/dL (ref 13.0–17.0)
MCH: 33.2 pg (ref 26.0–34.0)
MCHC: 32.6 g/dL (ref 30.0–36.0)
MCV: 101.7 fL — ABNORMAL HIGH (ref 80.0–100.0)
Platelets: 247 10*3/uL (ref 150–400)
RBC: 4.16 MIL/uL — ABNORMAL LOW (ref 4.22–5.81)
RDW: 12.8 % (ref 11.5–15.5)
WBC: 3.7 10*3/uL — ABNORMAL LOW (ref 4.0–10.5)
nRBC: 0 % (ref 0.0–0.2)

## 2020-11-20 LAB — BASIC METABOLIC PANEL
Anion gap: 8 (ref 5–15)
BUN: 23 mg/dL (ref 8–23)
CO2: 23 mmol/L (ref 22–32)
Calcium: 8 mg/dL — ABNORMAL LOW (ref 8.9–10.3)
Chloride: 104 mmol/L (ref 98–111)
Creatinine, Ser: 1.81 mg/dL — ABNORMAL HIGH (ref 0.61–1.24)
GFR, Estimated: 41 mL/min — ABNORMAL LOW (ref >60)
Glucose, Bld: 158 mg/dL — ABNORMAL HIGH (ref 70–99)
Potassium: 3.3 mmol/L — ABNORMAL LOW (ref 3.5–5.1)
Sodium: 135 mmol/L (ref 135–145)

## 2020-11-20 LAB — CBG MONITORING, ED: Glucose-Capillary: 140 mg/dL — ABNORMAL HIGH (ref 70–99)

## 2020-11-20 LAB — TROPONIN I (HIGH SENSITIVITY)
Troponin I (High Sensitivity): 45 ng/L — ABNORMAL HIGH (ref <18)
Troponin I (High Sensitivity): 42 ng/L — ABNORMAL HIGH (ref <18)

## 2020-11-20 LAB — RESP PANEL BY RT-PCR (FLU A&B, COVID) ARPGX2
Influenza A by PCR: NEGATIVE
Influenza B by PCR: NEGATIVE
SARS Coronavirus 2 by RT PCR: POSITIVE — AB

## 2020-11-20 LAB — MAGNESIUM: Magnesium: 2 mg/dL (ref 1.7–2.4)

## 2020-11-20 MED ORDER — SODIUM CHLORIDE 0.9 % IV BOLUS
1000.0000 mL | Freq: Once | INTRAVENOUS | Status: AC
  Administered 2020-11-20: 1000 mL via INTRAVENOUS

## 2020-11-20 MED ORDER — POTASSIUM CHLORIDE CRYS ER 20 MEQ PO TBCR
20.0000 meq | EXTENDED_RELEASE_TABLET | Freq: Once | ORAL | Status: AC
  Administered 2020-11-20: 20 meq via ORAL
  Filled 2020-11-20: qty 1

## 2020-11-20 NOTE — ED Provider Notes (Signed)
[facility] EMERGENCY DEPARTMENT Provider Note   CSN: [id number] Arrival date & time: 11/20/20  2059     History Chief Complaint  Patient presents with  . Loss of Consciousness    Derrick Gaines is a 64 y.o. male who presents to the emergency department with an episode of near syncope or syncope.  The patient reports that he was in his usual state of health earlier today.  He was sitting down for Thanksgiving dinner, does not recall what happened afterwards, but states he woke up when his wife was totaled over him asking if he was okay.  He says EMS arrived at the house and then he again had a syncopal episode, and then woke up in a chair.  He now feels back to normal.  He denies any lightheadedness.  He denies any abdominal pain.  He denies any chest pain or shortness of breath.  He denies any prodromal symptoms prior to his syncope.  He denies any history of seizure or syncope prior to this.  He denies any history of MI.  He reports his only medical issues are high blood pressure, for which she takes amlodipine 10 mg daily, as well as sertraline for depression.  His wife Derrick Gaines provide supplemental history by phone.  She says the patient was eating at the table, and when she had her back turned, she heard a "boom" and found him flat on his back on the ground.  She says he is slipped out of his chair and fell directly onto his back.  She denies that he had lost consciousness, states that he was awake when she ran over to him, and able to answer his questions but seemed kind of dazed.  She called EMS who came and helped him into a chair.  Within a few moments he became pale and again briefly lost consciousness for a second.  Subsequently regained consciousness.  She was he has been in his normal state of health.  She says he does not typically take his blood pressure medications, and often skips days, and does not think he took any today.  The patient denies to me any history of aneurysm.  He is a  long-term smoker and smokes about a pack per day.  He reports he received a single dose of the Covid vaccine, [employer] several months ago.  He denies any fevers or chills recently    HPI     Past Medical History:  Diagnosis Date  . Arthritis   . Cancer ([place])   . Chronic kidney disease   . Hypertension   . Stroke [facility])    mild left sided weakness  . Vocal cord polyp     Patient Active Problem List   Diagnosis Date Noted  . Syncope and collapse 11/20/2020  . Cancer ([place])   . High cholesterol 04/07/2018  . Thymoma 04/07/2018  . CVA (cerebral vascular accident) ([place]) 02/12/2012  . Tobacco abuse 02/12/2012  . DERMATITIS 04/17/2008  . OBESITY NOS 08/18/2007  . Essential hypertension 08/18/2007  . ARTHRITIS 08/18/2007  . DEGENERATIVE DISC DISEASE, LUMBAR SPINE 08/18/2007  . MALAISE AND FATIGUE 08/18/2007    Past Surgical History:  Procedure Laterality Date  . BACK SURGERY    . MICROLARYNGOSCOPY N/A 08/29/2018   Procedure: DIRECT MICROLARYNGOSCOPY WITH EXCISION OF LARYNGEAL MASS;  Surgeon: Leta Baptist, MD;  Location: [place];  Service: ENT;  Laterality: N/A;  . STERNOTOMY    . THYMECTOMY  Family History  Problem Relation Age of Onset  . Diabetes Mother   . Cancer Mother   . Colon cancer Sister   . Pneumonia Father   . Emphysema Brother     Social History   Tobacco Use  . Smoking status: Current Every Day Smoker    Packs/day: 0.50    Years: 30.00    Pack years: 15.00    Types: Cigarettes  . Smokeless tobacco: Never Used  Vaping Use  . Vaping Use: Never used  Substance Use Topics  . Alcohol use: No  . Drug use: No    Home Medications Prior to Admission medications   Medication Sig Start Date End Date Taking? Authorizing Provider  amLODipine (NORVASC) 10 MG tablet Take 1 tablet (10 mg total) by mouth daily. Patient not taking: Reported on 06/19/2020 08/16/18   Caren Macadam, MD  aspirin EC 81 MG tablet Take 1 tablet (81 mg  total) by mouth daily. 03/08/18   Caren Macadam, MD  Aspirin-Salicylamide-Caffeine (ARTHRITIS STRENGTH BC POWDER PO) Take by mouth. Patient not taking: Reported on 06/19/2020    [provider]  atorvastatin (LIPITOR) 20 MG tablet Take 1 tablet (20 mg total) by mouth daily. Patient not taking: Reported on 06/19/2020 04/11/18   Caren Macadam, MD  losartan (COZAAR) 100 MG tablet Take 1 tablet (100 mg total) by mouth daily. Patient not taking: Reported on 06/19/2020 05/31/18   Caren Macadam, MD  omeprazole (PRILOSEC) 20 MG capsule Take 20 mg by mouth daily. Patient not taking: Reported on 06/19/2020    [provider]    Allergies    Patient has no known allergies.  Review of Systems   Review of Systems  Constitutional: Negative for chills and fever.  Eyes: Negative for pain and visual disturbance.  Respiratory: Negative for cough and shortness of breath.   Cardiovascular: Negative for chest pain and palpitations.  Gastrointestinal: Negative for abdominal pain, nausea and vomiting.  Genitourinary: Negative for dysuria and hematuria.  Musculoskeletal: Negative for arthralgias and myalgias.  Skin: Negative for color change and rash.  Neurological: Positive for syncope. Negative for dizziness, seizures, facial asymmetry, speech difficulty, weakness, light-headedness, numbness and headaches.  Psychiatric/Behavioral: Negative for agitation and confusion.  All other systems reviewed and are negative.   Physical Exam Updated Vital Signs BP (!) 149/86   Pulse 85   Temp 98.9 F (37.2 C) (Oral)   Resp (!) 25   Ht 6\' 2"  (1.88 m)   Wt 127 kg   SpO2 95%   BMI 35.95 kg/m   Physical Exam Vitals and nursing note reviewed.  Constitutional:      Appearance: He is well-developed. He is obese.  HENT:     Head: Normocephalic and atraumatic.  Eyes:     Conjunctiva/sclera: Conjunctivae normal.  Cardiovascular:     Rate and Rhythm: Normal rate and regular rhythm.     Pulses:  Normal pulses.  Pulmonary:     Effort: Pulmonary effort is normal. No respiratory distress.     Breath sounds: Normal breath sounds.  Abdominal:     General: There is no distension.     Palpations: Abdomen is soft.     Tenderness: There is no abdominal tenderness. There is no guarding.     Comments: No abdominal mass or bruit  Musculoskeletal:     Cervical back: Neck supple.  Skin:    General: Skin is warm and dry.  Neurological:     General: No focal deficit present.  Mental Status: He is alert and oriented to person, place, and time.     Cranial Nerves: No cranial nerve deficit.     Sensory: No sensory deficit.     Motor: No weakness.  Psychiatric:        Mood and Affect: Mood normal.        Behavior: Behavior normal.     ED Results / Procedures / Treatments   Labs (all labs ordered are listed, but only abnormal results are displayed) Labs Reviewed  RESP PANEL BY RT-PCR (FLU A&B, COVID) ARPGX2 - Abnormal; Notable for the following components:      Result Value   SARS Coronavirus 2 by RT PCR POSITIVE (*)    All other components within normal limits  BASIC METABOLIC PANEL - Abnormal; Notable for the following components:   Potassium 3.3 (*)    Glucose, Bld 158 (*)    Creatinine, Ser 1.81 (*)    Calcium 8.0 (*)    GFR, Estimated 41 (*)    All other components within normal limits  CBC - Abnormal; Notable for the following components:   WBC 3.7 (*)    RBC 4.16 (*)    MCV 101.7 (*)    All other components within normal limits  URINALYSIS, ROUTINE W REFLEX MICROSCOPIC - Abnormal; Notable for the following components:   Hgb urine dipstick SMALL (*)    Protein, ur 30 (*)    All other components within normal limits  CBG MONITORING, ED - Abnormal; Notable for the following components:   Glucose-Capillary 140 (*)    All other components within normal limits  TROPONIN I (HIGH SENSITIVITY) - Abnormal; Notable for the following components:   Troponin I (High Sensitivity)  45 (*)    All other components within normal limits  TROPONIN I (HIGH SENSITIVITY) - Abnormal; Notable for the following components:   Troponin I (High Sensitivity) 42 (*)    All other components within normal limits  MAGNESIUM  HIV ANTIBODY (ROUTINE TESTING W REFLEX)  COMPREHENSIVE METABOLIC PANEL  CBC  PROTIME-INR  APTT  MAGNESIUM  PHOSPHORUS    EKG EKG Interpretation  Date/Time:  Thursday November 20 2020 21:13:40 EST Ventricular Rate:  75 PR Interval:    QRS Duration: 94 QT Interval:  421 QTC Calculation: 471 R Axis:   -27 Text Interpretation: Sinus rhythm Borderline left axis deviation Abnormal R-wave progression, early transition Abnrm T, consider ischemia, anterolateral lds T wave inversions noted on Aug 2019 ecg No STEMI Confirmed by Octaviano Glow [phone]) on 11/20/2020 9:42:54 PM   Radiology DG Chest Portable 1 View  Result Date: 11/20/2020 CLINICAL DATA:  Initial evaluation for acute syncope. EXAM: PORTABLE CHEST 1 VIEW COMPARISON:  Prior radiograph from 02/13/2012. FINDINGS: Median sternotomy wires. Transverse heart size within normal limits. Mediastinal silhouette normal. Lungs mildly hypoinflated. Scattered diffuse bronchitic changes, which could be related history of smoking and/or acute bronchiolitis. No consolidative airspace disease. No pulmonary edema or pleural effusion. No pneumothorax. No acute osseous finding. IMPRESSION: 1. Scattered diffuse bronchitic changes, which could be related to history of smoking and/or acute bronchiolitis. No frank airspace consolidation to suggest bronchopneumonia. 2. No other active cardiopulmonary disease. Electronically Signed   By: Jeannine Boga M.D.   On: 11/20/2020 22:26    Procedures Procedures (including critical care time)  Medications Ordered in ED Medications  heparin injection 5,000 Units (5,000 Units Subcutaneous Given 11/21/20 0031)  sodium chloride 0.9 % bolus 1,000 mL (0 mLs Intravenous Stopped  11/21/20 0036)  potassium  chloride SA (KLOR-CON) CR tablet 20 mEq (20 mEq Oral Given 11/20/20 2319)    ED Course  I have reviewed the triage vital signs and the nursing notes.  Pertinent labs & imaging results that were available during my care of the patient were reviewed by me and considered in my medical decision making (see chart for details).  This patient complains of near syncope vs syncope.  This involves an extensive number of treatment options, and is a complaint that carries with it a high risk of complications and morbidity.  The differential diagnosis includes hypotension vs infection (including covid illness) vs arrhythmia vs anemia vs other  I ordered, reviewed, and interpreted labs, which included trop 45 -> 42, UA neg leuks, nitrites, BMP with Cr 1.8 (baseline 1.5), K 3.3, WBC 3.7, Hgb 13.8. Orthostatic vital signs negative here IV fluids and PO potassium ordered I ordered imaging studies which included dg chest I independently visualized and interpreted imaging which showed interstitial markings and the monitor tracing which showed sinus rhythm Additional history was obtained from his wife by phone   ON reassessment vitals stable.  Admitted to hospitalist for syncope evaluation. Covid test pending at the time of admisison.  GRIER VU was evaluated in Emergency Department on 11/21/2020 for the symptoms described in the history of present illness. He was evaluated in the context of the global COVID-19 pandemic, which necessitated consideration that the patient might be at risk for infection with the SARS-CoV-2 virus that causes COVID-19. Institutional protocols and algorithms that pertain to the evaluation of patients at risk for COVID-19 are in a state of rapid change based on information released by regulatory bodies including the CDC and federal and state organizations. These policies and algorithms were followed during the patient's care in the ED.    Clinical Course  as of Nov 22 107  Thu Nov 20, 2020  2305 Signed out to hospitalist for syncope - would recommend tele observation overnight, echocardiogram in the AM.  Covid is pending.  He is vaccinated with J&J but this remains a possibility as well.  His wife was updated by phone and is en route to the hospital.   [MT]    Clinical Course User Index [MT] Dannis Deroche, Carola Rhine, MD    Final Clinical Impression(s) / ED Diagnoses Final diagnoses:  Near syncope  Hypokalemia  COVID-19    Rx / DC Orders ED Discharge Orders    None       Wyvonnia Dusky, MD 11/21/20 [phone]

## 2020-11-20 NOTE — H&P (Signed)
History and Physical  Derrick Gaines RAQ:[id number] DOB: 01-22-1956 DOA: 11/20/2020  Referring physician: Wyvonnia Dusky, MD PCP: Rosita Fire, MD  Patient coming from: Home  Chief Complaint: Loss of consciousness HPI: Derrick Gaines is a 64 y.o. male with medical history significant for hypertension, GERD, depression and obesity who presents to the emergency department after passing out at home.  Patient states that he was in his usual state of health earlier today and that while having his Thanksgiving dinner, he realized that he woke up on the floor and his wife was asking him if he was okay, it was then that he realized that he passed out for a few seconds (wife told him that he slipped out of his chair and fell directly onto his back), he denies biting of tongue, having a bowel or urinary incontinence.  EMS was activated, on arrival of EMS, he was helped to a chair and within a few moments, he passed out again woke up within a few seconds.  Patient again quickly returned to his baseline without any postictal state.  Patient states that he has never had similar situation before.  He denies chest pain, shortness of breath, nausea, vomiting, fever, chills, lightheadedness, history of seizure or syncope.  Patient endorsed not being very compliant with these blood pressure medications.  ED Course:  In the emergency department, he was hemodynamically stable, BP was 146/87.  Work-up in the ED showed leukopenia, hypokalemia, BUN to creatinine 23/1.81 (baseline creatinine at 1.5-1.6) hyperglycemia, troponin x 2 - 45 > 42, urinalysis was unimpressive for UTI.  SARS coronavirus 2 was positive.  Chest x-ray showed scattered diffuse bronchitic changes which could be related to history of smoking and/or acute bronchiolitis.  IV hydration was provided, potassium was replenished.  Hospitalist was asked to admit patient for further evaluation and management.  Review of Systems: Constitutional: Negative for  chills and fever.  HENT: Negative for ear pain and sore throat.   Eyes: Negative for pain and visual disturbance.  Respiratory: Negative for cough, chest tightness and shortness of breath.   Cardiovascular: Negative for chest pain and palpitations.  Gastrointestinal: Negative for abdominal pain and vomiting.  Endocrine: Negative for polyphagia and polyuria.  Genitourinary: Negative for decreased urine volume, dysuria, enuresis Musculoskeletal: Negative for arthralgias and back pain.  Skin: Negative for color change and rash.  Allergic/Immunologic: Negative for immunocompromised state.  Neurological: Positive for syncope.  Negative for tremors,speech difficulty, weakness, light-headedness and headaches.  Hematological: Does not bruise/bleed easily.  All other systems reviewed and are negative  Past Medical History:  Diagnosis Date  . Derrick   . Cancer ([facility] [place])   . Chronic kidney disease   . Hypertension   . Stroke [facility])    mild left sided weakness  . Vocal cord polyp    Past Surgical History:  Procedure Laterality Date  . BACK SURGERY    . MICROLARYNGOSCOPY N/A 08/29/2018   Procedure: DIRECT MICROLARYNGOSCOPY WITH EXCISION OF LARYNGEAL MASS;  Surgeon: Leta Baptist, MD;  Location: [place];  Service: ENT;  Laterality: N/A;  . STERNOTOMY    . THYMECTOMY      Social History:  reports that he has been smoking cigarettes. He has a 15.00 pack-year smoking history. He has never used smokeless tobacco. He reports that he does not drink alcohol and does not use drugs.   No Known Allergies  Family History  Problem Relation Age of Onset  . Diabetes Mother   . Cancer Mother   .  Colon cancer Sister   . Pneumonia Father   . Emphysema Brother     Prior to Admission medications   Medication Sig Start Date End Date Taking? Authorizing Provider  amLODipine (NORVASC) 10 MG tablet Take 1 tablet (10 mg total) by mouth daily. Patient not taking: Reported on 06/19/2020 08/16/18    Caren Macadam, MD  aspirin EC 81 MG tablet Take 1 tablet (81 mg total) by mouth daily. 03/08/18   Caren Macadam, MD  Aspirin-Salicylamide-Caffeine (Derrick Gaines) Take by mouth. Patient not taking: Reported on 06/19/2020    [provider]  atorvastatin (LIPITOR) 20 MG tablet Take 1 tablet (20 mg total) by mouth daily. Patient not taking: Reported on 06/19/2020 04/11/18   Caren Macadam, MD  losartan (COZAAR) 100 MG tablet Take 1 tablet (100 mg total) by mouth daily. Patient not taking: Reported on 06/19/2020 05/31/18   Caren Macadam, MD  omeprazole (PRILOSEC) 20 MG capsule Take 20 mg by mouth daily. Patient not taking: Reported on 06/19/2020    [provider]    Physical Exam: BP (!) 149/86   Pulse 85   Temp 98.9 F (37.2 C) (Oral)   Resp (!) 25   Ht 6\' 2"  (1.88 m)   Wt 127 kg   SpO2 95%   BMI 35.95 kg/m   . General: 64 y.o. year-old male obese in no acute distress.  Alert and oriented x3. Marland Kitchen HEENT: NCAT, EOMI . Neck: Supple, trachea medial . Cardiovascular: Regular rate and rhythm with no rubs or gallops.  No thyromegaly or JVD noted.  No lower extremity edema. 2/4 pulses in all 4 extremities. Marland Kitchen Respiratory: Clear to auscultation with no wheezes or rales. Good inspiratory effort. . Abdomen: Soft nontender nondistended with normal bowel sounds x4 quadrants. . Muskuloskeletal: No cyanosis, clubbing or edema noted bilaterally . Neuro: CN II-XII intact, strength, sensation, reflexes . Skin: No ulcerative lesions noted or rashes . Psychiatry: Judgement and insight appear normal. Mood is appropriate for condition and setting          Labs on Admission:  Basic Metabolic Panel: Recent Labs  Lab 11/20/20 2125 11/20/20 2325  NA 135  --   K 3.3*  --   CL 104  --   CO2 23  --   GLUCOSE 158*  --   BUN 23  --   CREATININE 1.81*  --   CALCIUM 8.0*  --   MG  --  2.0   Liver Function Tests: No results for input(s): AST, ALT, ALKPHOS, BILITOT,  PROT, ALBUMIN in the last 168 hours. No results for input(s): LIPASE, AMYLASE in the last 168 hours. No results for input(s): AMMONIA in the last 168 hours. CBC: Recent Labs  Lab 11/20/20 2125  WBC 3.7*  HGB 13.8  HCT 42.3  MCV 101.7*  PLT 247   Cardiac Enzymes: No results for input(s): CKTOTAL, CKMB, CKMBINDEX, TROPONINI in the last 168 hours.  BNP (last 3 results) No results for input(s): BNP in the last 8760 hours.  ProBNP (last 3 results) No results for input(s): PROBNP in the last 8760 hours.  CBG: Recent Labs  Lab 11/20/20 2112  GLUCAP 140*    Radiological Exams on Admission: DG Chest Portable 1 View  Result Date: 11/20/2020 CLINICAL DATA:  Initial evaluation for acute syncope. EXAM: PORTABLE CHEST 1 VIEW COMPARISON:  Prior radiograph from 02/13/2012. FINDINGS: Median sternotomy wires. Transverse heart size within normal limits. Mediastinal silhouette normal. Lungs mildly hypoinflated. Scattered diffuse bronchitic changes,  which could be related history of smoking and/or acute bronchiolitis. No consolidative airspace disease. No pulmonary edema or pleural effusion. No pneumothorax. No acute osseous finding. IMPRESSION: 1. Scattered diffuse bronchitic changes, which could be related to history of smoking and/or acute bronchiolitis. No frank airspace consolidation to suggest bronchopneumonia. 2. No other active cardiopulmonary disease. Electronically Signed   By: Jeannine Boga M.D.   On: 11/20/2020 22:26    EKG: I independently viewed the EKG done and my findings are as followed: Sinus rhythm at rate of 75 bpm with T wave inversion in leads I, II, aVF and V3-V6 (T wave inversion noted in leads V3-V6 on EKG done in August 2019).  Assessment/Plan Present on Admission: . Syncope and collapse . OBESITY NOS . Essential hypertension  Principal Problem:   Syncope and collapse Active Problems:   OBESITY NOS   Essential hypertension   COVID-19 virus infection    Leukopenia   Hypokalemia   Hyperglycemia   Troponin level elevated   Syncope and collapse Patient will be admitted to telemetry monitored unit, watch for arrhythmias Troponins x 2 -45 > 42 Orthostatic vital signs was negative EKG showed Sinus rhythm at rate of 75 bpm with T wave inversion in leads I, II, aVF and V3-V6 (T wave inversion noted in leads V3-V6 on EKG done in August 2019). Echocardiogram will be done in the morning to rule out significant aortic stenosis or other outflow obstruction, and also to evaluate EF and to rule out segmental/Regional wall motion abnormalities.  Carotid artery Dopplers to rule out hemodynamically significant stenosis will be done in the morning  COVID-19 virus infectio Sars coronavirus-2 was positive, patient was not hypoxic, and he has no shortness of breath. Continue airborne isolation precaution Continue monitoring daily inflammatory markers  Leukopenia possibly reactive WBC 3.5; continue to monitor WBC with morning labs  Hypokalemia K+ 3.3, this was replenished  Hyperglycemia possibly reactive BG= 158; continue to monitor and check hemoglobin A1c if blood glucose level continues to stay elevated  Elevated troponin possibly secondary to type II demand ischemia Troponin 45 > 42; patient denies chest pain Continue to monitor  Essential hypertension Continue amlodipine, losartan  GERD Continue Protonix  Obesity (BMI 35.95) Patient counseled on diet and lifestyle modification.  DVT prophylaxis: Heparin subcu  Code Status: Full code  Family Communication: None at bedside           Disposition Plan:  Patient is from:                        home Anticipated DC to:                  home Anticipated DC date:               1 day Anticipated DC barriers:           Patient is unstable to be discharged at this time due to acute syncopal episode that requires further work-up  Consults called: None  Admission status:  Observation  Derrick Hoit MD [facility]  11/21/2020, 1:19 AM

## 2020-11-20 NOTE — ED Notes (Signed)
Patient given a urinal and urine specimen requested.

## 2020-11-20 NOTE — ED Triage Notes (Signed)
Eating Thanksgiving supper and had 2 syncopal episodes   one w family  One w EMS

## 2020-11-21 ENCOUNTER — Observation Stay (HOSPITAL_COMMUNITY): Payer: Medicare PPO

## 2020-11-21 ENCOUNTER — Other Ambulatory Visit (HOSPITAL_COMMUNITY): Payer: Medicare PPO

## 2020-11-21 DIAGNOSIS — U071 COVID-19: Secondary | ICD-10-CM | POA: Insufficient documentation

## 2020-11-21 DIAGNOSIS — N183 Chronic kidney disease, stage 3 unspecified: Secondary | ICD-10-CM

## 2020-11-21 DIAGNOSIS — R7989 Other specified abnormal findings of blood chemistry: Secondary | ICD-10-CM

## 2020-11-21 DIAGNOSIS — R739 Hyperglycemia, unspecified: Secondary | ICD-10-CM

## 2020-11-21 DIAGNOSIS — E876 Hypokalemia: Secondary | ICD-10-CM | POA: Diagnosis not present

## 2020-11-21 DIAGNOSIS — I10 Essential (primary) hypertension: Secondary | ICD-10-CM | POA: Diagnosis not present

## 2020-11-21 DIAGNOSIS — D72819 Decreased white blood cell count, unspecified: Secondary | ICD-10-CM

## 2020-11-21 DIAGNOSIS — E669 Obesity, unspecified: Secondary | ICD-10-CM | POA: Diagnosis not present

## 2020-11-21 DIAGNOSIS — N1831 Chronic kidney disease, stage 3a: Secondary | ICD-10-CM | POA: Diagnosis not present

## 2020-11-21 DIAGNOSIS — R55 Syncope and collapse: Secondary | ICD-10-CM | POA: Diagnosis not present

## 2020-11-21 DIAGNOSIS — K219 Gastro-esophageal reflux disease without esophagitis: Secondary | ICD-10-CM

## 2020-11-21 DIAGNOSIS — R778 Other specified abnormalities of plasma proteins: Secondary | ICD-10-CM

## 2020-11-21 LAB — CBC
HCT: 44.2 % (ref 39.0–52.0)
Hemoglobin: 13.7 g/dL (ref 13.0–17.0)
MCH: 32.1 pg (ref 26.0–34.0)
MCHC: 31 g/dL (ref 30.0–36.0)
MCV: 103.5 fL — ABNORMAL HIGH (ref 80.0–100.0)
Platelets: 235 10*3/uL (ref 150–400)
RBC: 4.27 MIL/uL (ref 4.22–5.81)
RDW: 12.9 % (ref 11.5–15.5)
WBC: 3.6 10*3/uL — ABNORMAL LOW (ref 4.0–10.5)
nRBC: 0 % (ref 0.0–0.2)

## 2020-11-21 LAB — PROTIME-INR
INR: 1.1 (ref 0.8–1.2)
Prothrombin Time: 13.5 seconds (ref 11.4–15.2)

## 2020-11-21 LAB — COMPREHENSIVE METABOLIC PANEL
ALT: 23 U/L (ref 0–44)
AST: 47 U/L — ABNORMAL HIGH (ref 15–41)
Albumin: 3.2 g/dL — ABNORMAL LOW (ref 3.5–5.0)
Alkaline Phosphatase: 48 U/L (ref 38–126)
Anion gap: 6 (ref 5–15)
BUN: 21 mg/dL (ref 8–23)
CO2: 26 mmol/L (ref 22–32)
Calcium: 8.1 mg/dL — ABNORMAL LOW (ref 8.9–10.3)
Chloride: 104 mmol/L (ref 98–111)
Creatinine, Ser: 1.65 mg/dL — ABNORMAL HIGH (ref 0.61–1.24)
GFR, Estimated: 46 mL/min — ABNORMAL LOW (ref >60)
Glucose, Bld: 177 mg/dL — ABNORMAL HIGH (ref 70–99)
Potassium: 3.8 mmol/L (ref 3.5–5.1)
Sodium: 136 mmol/L (ref 135–145)
Total Bilirubin: 0.5 mg/dL (ref 0.3–1.2)
Total Protein: 6.8 g/dL (ref 6.5–8.1)

## 2020-11-21 LAB — URINALYSIS, ROUTINE W REFLEX MICROSCOPIC
Bacteria, UA: NONE SEEN
Bilirubin Urine: NEGATIVE
Glucose, UA: NEGATIVE mg/dL
Ketones, ur: NEGATIVE mg/dL
Leukocytes,Ua: NEGATIVE
Nitrite: NEGATIVE
Protein, ur: 30 mg/dL — AB
Specific Gravity, Urine: 1.014 (ref 1.005–1.030)
pH: 5 (ref 5.0–8.0)

## 2020-11-21 LAB — HEMOGLOBIN A1C
Hgb A1c MFr Bld: 5.7 % — ABNORMAL HIGH (ref 4.8–5.6)
Mean Plasma Glucose: 116.89 mg/dL

## 2020-11-21 LAB — HIV ANTIBODY (ROUTINE TESTING W REFLEX): HIV Screen 4th Generation wRfx: NONREACTIVE

## 2020-11-21 LAB — PHOSPHORUS: Phosphorus: 3 mg/dL (ref 2.5–4.6)

## 2020-11-21 LAB — D-DIMER, QUANTITATIVE: D-Dimer, Quant: 2.22 ug/mL-FEU — ABNORMAL HIGH (ref 0.00–0.50)

## 2020-11-21 LAB — APTT: aPTT: 35 seconds (ref 24–36)

## 2020-11-21 LAB — MAGNESIUM: Magnesium: 2.3 mg/dL (ref 1.7–2.4)

## 2020-11-21 LAB — LACTATE DEHYDROGENASE: LDH: 168 U/L (ref 98–192)

## 2020-11-21 LAB — C-REACTIVE PROTEIN: CRP: 2.7 mg/dL — ABNORMAL HIGH (ref <1.0)

## 2020-11-21 LAB — FERRITIN: Ferritin: 983 ng/mL — ABNORMAL HIGH (ref 24–336)

## 2020-11-21 MED ORDER — IOHEXOL 350 MG/ML SOLN
75.0000 mL | Freq: Once | INTRAVENOUS | Status: AC | PRN
  Administered 2020-11-21: 75 mL via INTRAVENOUS

## 2020-11-21 MED ORDER — AMLODIPINE BESYLATE 5 MG PO TABS: 10.0000 mg | ORAL_TABLET | Freq: Every day | ORAL | Status: DC

## 2020-11-21 MED ORDER — LOSARTAN POTASSIUM 25 MG PO TABS: 100.0000 mg | ORAL_TABLET | Freq: Every day | ORAL | Status: DC

## 2020-11-21 MED ORDER — HEPARIN SODIUM (PORCINE) 5000 UNIT/ML IJ SOLN
5000.0000 [IU] | Freq: Three times a day (TID) | INTRAMUSCULAR | Status: DC
  Administered 2020-11-21: 5000 [IU] via SUBCUTANEOUS
  Filled 2020-11-21: qty 1

## 2020-11-21 MED ORDER — PANTOPRAZOLE SODIUM 40 MG PO TBEC: 40.0000 mg | DELAYED_RELEASE_TABLET | Freq: Every day | ORAL | Status: DC

## 2020-11-21 NOTE — ED Notes (Signed)
Wife informed that pt left AMA and was refusing treatment.

## 2020-11-21 NOTE — ED Notes (Signed)
Pt requesting me to call wife to pick him.

## 2020-11-21 NOTE — ED Notes (Signed)
Report given to oncoming nurse.

## 2020-11-21 NOTE — ED Notes (Signed)
Found patient to be sitting at the end of the stretcher.  He states "I want to go home.  This hospital is not doing anything for me."  Educated patient about admission for needed supplemental oxygen.  He states he wants to walk home.  Encouraged patient to stay.  He states he is willing to speak to the physician prior to leaving.

## 2020-11-21 NOTE — ED Notes (Signed)
Hospitalist notified that patient is requesting to leave, but still requiring supplemental oxygen.

## 2020-11-21 NOTE — ED Notes (Signed)
Refusing all treatment, MD notified.

## 2020-11-21 NOTE — Progress Notes (Signed)
PROGRESS NOTE  Derrick Gaines NFA:[id number] DOB: [birth year] DOA: 11/20/2020 PCP: Rosita Fire, MD  Brief History:  64 year old male with a history of hypertension, GERD, depression, tobacco abuse, hyperlipidemia, stroke presenting with a syncopal episode.  The patient was sitting at the dinner table eating Thanksgiving dinner when he fell backward hitting his back.  The patient stated that he had lost consciousness for only a few seconds.  His wife came to check up on him immediately.  The patient was awake and lucid at that time.  He denied any aura, chest discomfort, shortness of breath, or dizziness.  He states that he had been in his usual state of health.  There is been no changes in his medications.  EMS was activated.  Upon EMS arrival, the patient had a second episode where he was just staring into space for a few seconds, but did not lose consciousness.  He denied any dysarthria, focal extremity weakness, dysphagia, visual disturbance.  Notably, the patient has not been very compliant with his antihypertensive medications.  He states that he only takes it 3 or 4 days/week.  He denies any fevers, chills, chest pain, shortness breath, cough, hemoptysis, nausea, vomit, diarrhea, abdominal pain, dysuria, hematuria, dizziness, headache. In the emergency department, the patient was afebrile and hemodynamically stable with oxygen saturation 92-95% on room air.  BMP showed a serum creatinine 1.81 with otherwise normal electrolytes.  WBC 3.7, hemoglobin 13.8, platelets 247,000.  AST 47, ALT 23, alkaline phosphatase 48, total bilirubin 0.5.  UA was negative for pyuria.  Chest x-ray showed bronchitic changes.  COVID-19 PCR was positive.  Patient was admitted for further work-up of his syncope.  Assessment/Plan: Syncope -Orthostatic vital signs negative -Personally reviewed telemetry--no concerning dysrhythmia -Echocardiogram -Remain on telemetry -EEG -CT brain -Personally reviewed  EKG--sinus rhythm, unchanged T wave inversion in the lateral leads, T wave inversion 1 to aVF  COVID-19 infection -This was an incidental finding -Patient has been asymptomatic without any tachypnea, coughing, hypoxia -He has been vaccinated back in March 2021 -D-dimer 2.22 -Obtain CTA chest -Check CRP--2.7 -Check ferritin--93  Essential hypertension -Continue amlodipine -Holding losartan temporarily  CKD 3a -Baseline creatinine 1.5-1.6 -Presented with serum creatinine 1.81  Leukopenia -Likely from viral infection -Monitor clinically  Class II obesity -BMI 35.95 -Lifestyle modification  Hyperglycemia -Check hemoglobin A1c  Hypokalemia -Repleted  Troponin -No chest pain -Echocardiogram      Status is: Observation  The patient remains OBS appropriate and will d/c before 2 midnights.  Dispo: The patient is from: Home              Anticipated d/c is to: Home              Anticipated d/c date is: 1 day              Patient currently is not medically stable to d/c.        Family Communication:   Spouse updated  Consultants:  none  Code Status:  FULL /  DVT Prophylaxis:  [place] Heparin /   Procedures: As Listed in Progress Note Above  Antibiotics: None      Subjective: Patient denies fevers, chills, headache, chest pain, dyspnea, nausea, vomiting, diarrhea, abdominal pain, dysuria, hematuria, hematochezia, and melena.   Objective: Vitals:   11/21/20 0030 11/21/20 0415 11/21/20 0500 11/21/20 0600  BP: (!) 149/86 129/82  (!) 144/75  Pulse: 85 73 67 (!) 58  Resp: (!) 25 14 (!)  21 (!) 21  Temp:  98.4 F (36.9 C)    TempSrc:  Oral    SpO2: 95% 92% 95% 97%  Weight:      Height:        Intake/Output Summary (Last 24 hours) at 11/21/2020 0743 Last data filed at 11/21/2020 0036 Gross per 24 hour  Intake 1000 ml  Output --  Net 1000 ml   Weight change:  Exam:   General:  Pt is alert, follows commands appropriately, not in acute  distress  HEENT: No icterus, No thrush, No neck mass, [place]/AT  Cardiovascular: RRR, S1/S2, no rubs, no gallops  Respiratory: bibasilar rales. No wheeze  Abdomen: Soft/+BS, non tender, non distended, no guarding  Extremities: No edema, No lymphangitis, No petechiae, No rashes, no synovitis   Data Reviewed: I have personally reviewed following labs and imaging studies Basic Metabolic Panel: Recent Labs  Lab 11/20/20 2125 11/20/20 2325 11/21/20 0244  NA 135  --  136  K 3.3*  --  3.8  CL 104  --  104  CO2 23  --  26  GLUCOSE 158*  --  177*  BUN 23  --  21  CREATININE 1.81*  --  1.65*  CALCIUM 8.0*  --  8.1*  MG  --  2.0 2.3  PHOS  --   --  3.0   Liver Function Tests: Recent Labs  Lab 11/21/20 0244  AST 47*  ALT 23  ALKPHOS 48  BILITOT 0.5  PROT 6.8  ALBUMIN 3.2*   No results for input(s): LIPASE, AMYLASE in the last 168 hours. No results for input(s): AMMONIA in the last 168 hours. Coagulation Profile: Recent Labs  Lab 11/21/20 0244  INR 1.1   CBC: Recent Labs  Lab 11/20/20 2125 11/21/20 0244  WBC 3.7* 3.6*  HGB 13.8 13.7  HCT 42.3 44.2  MCV 101.7* 103.5*  PLT 247 235   Cardiac Enzymes: No results for input(s): CKTOTAL, CKMB, CKMBINDEX, TROPONINI in the last 168 hours. BNP: Invalid input(s): POCBNP CBG: Recent Labs  Lab 11/20/20 2112  GLUCAP 140*   HbA1C: No results for input(s): HGBA1C in the last 72 hours. Urine analysis:    Component Value Date/Time   COLORURINE YELLOW 11/21/2020 0006   APPEARANCEUR CLEAR 11/21/2020 0006   LABSPEC 1.014 11/21/2020 0006   PHURINE 5.0 11/21/2020 0006   GLUCOSEU NEGATIVE 11/21/2020 0006   HGBUR SMALL (A) 11/21/2020 0006   BILIRUBINUR NEGATIVE 11/21/2020 0006   KETONESUR NEGATIVE 11/21/2020 0006   PROTEINUR 30 (A) 11/21/2020 0006   NITRITE NEGATIVE 11/21/2020 0006   LEUKOCYTESUR NEGATIVE 11/21/2020 0006   Sepsis Labs: @LABRCNTIP (procalcitonin:4,lacticidven:4) ) Recent Results (from the past 240  hour(s))  Resp Panel by RT-PCR (Flu A&B, Covid) Nasopharyngeal Swab     Status: Abnormal   Collection Time: 11/20/20  9:55 PM   Specimen: Nasopharyngeal Swab; Nasopharyngeal(NP) swabs in vial transport medium  Result Value Ref Range Status   SARS Coronavirus 2 by RT PCR POSITIVE (A) NEGATIVE Final    Comment: RESULT CALLED TO, READ BACK BY AND VERIFIED WITH: B NORMAN,RN @2330  11/20/20 MKELLY (NOTE) SARS-CoV-2 target nucleic acids are DETECTED.  The SARS-CoV-2 RNA is generally detectable in upper respiratory specimens during the acute phase of infection. Positive results are indicative of the presence of the identified virus, but do not rule out bacterial infection or co-infection with other pathogens not detected by the test. Clinical correlation with patient history and other diagnostic information is necessary to determine patient infection status. The  expected result is Negative.  Fact Sheet for Patients: EntrepreneurPulse.com.au  Fact Sheet for Healthcare Providers: IncredibleEmployment.be  This test is not yet approved or cleared by the [place] FDA and  has been authorized for detection and/or diagnosis of SARS-CoV-2 by FDA under an Emergency Use Authorization (EUA).  This EUA will remain in effect (meaning this test can be  used) for the duration of  the COVID-19 declaration under Section 564(b)(1) of the Act, 21 U.S.C. section 360bbb-3(b)(1), unless the authorization is terminated or revoked sooner.     Influenza A by PCR NEGATIVE NEGATIVE Final   Influenza B by PCR NEGATIVE NEGATIVE Final    Comment: (NOTE) The Xpert Xpress SARS-CoV-2/FLU/RSV plus assay is intended as an aid in the diagnosis of influenza from Nasopharyngeal swab specimens and should not be used as a sole basis for treatment. Nasal washings and aspirates are unacceptable for Xpert Xpress SARS-CoV-2/FLU/RSV testing.  Fact Sheet for  Patients: EntrepreneurPulse.com.au  Fact Sheet for Healthcare Providers: IncredibleEmployment.be  This test is not yet approved or cleared by the [place] FDA and has been authorized for detection and/or diagnosis of SARS-CoV-2 by FDA under an Emergency Use Authorization (EUA). This EUA will remain in effect (meaning this test can be used) for the duration of the COVID-19 declaration under Section 564(b)(1) of the Act, 21 U.S.C. section 360bbb-3(b)(1), unless the authorization is terminated or revoked.  Performed at [facility], [address] [place], [place] [address]      Scheduled Meds: . amLODipine  10 mg Oral Daily  . heparin  5,000 Units Subcutaneous Q8H  . pantoprazole  40 mg Oral Daily   Continuous Infusions:  Procedures/Studies: DG Chest Portable 1 View  Result Date: 11/20/2020 CLINICAL DATA:  Initial evaluation for acute syncope. EXAM: PORTABLE CHEST 1 VIEW COMPARISON:  Prior radiograph from 02/13/2012. FINDINGS: Median sternotomy wires. Transverse heart size within normal limits. Mediastinal silhouette normal. Lungs mildly hypoinflated. Scattered diffuse bronchitic changes, which could be related history of smoking and/or acute bronchiolitis. No consolidative airspace disease. No pulmonary edema or pleural effusion. No pneumothorax. No acute osseous finding. IMPRESSION: 1. Scattered diffuse bronchitic changes, which could be related to history of smoking and/or acute bronchiolitis. No frank airspace consolidation to suggest bronchopneumonia. 2. No other active cardiopulmonary disease. Electronically Signed   By: Jeannine Boga M.D.   On: 11/20/2020 22:26    Orson Eva, DO  [facility]  If 7PM-7AM, please contact night-coverage [email or web address] Password [facility] 11/21/2020, 7:43 AM   LOS: 0 days

## 2020-12-18 DIAGNOSIS — I10 Essential (primary) hypertension: Secondary | ICD-10-CM | POA: Diagnosis not present

## 2020-12-18 DIAGNOSIS — I6350 Cerebral infarction due to unspecified occlusion or stenosis of unspecified cerebral artery: Secondary | ICD-10-CM | POA: Diagnosis not present

## 2020-12-22 ENCOUNTER — Inpatient Hospital Stay (HOSPITAL_COMMUNITY): Payer: Medicare PPO | Attending: Hematology | Admitting: Hematology

## 2021-01-08 ENCOUNTER — Other Ambulatory Visit (HOSPITAL_COMMUNITY): Payer: Self-pay | Admitting: *Deleted

## 2021-01-08 DIAGNOSIS — D4989 Neoplasm of unspecified behavior of other specified sites: Secondary | ICD-10-CM

## 2021-01-08 DIAGNOSIS — M1990 Unspecified osteoarthritis, unspecified site: Secondary | ICD-10-CM | POA: Diagnosis not present

## 2021-01-08 DIAGNOSIS — I10 Essential (primary) hypertension: Secondary | ICD-10-CM | POA: Diagnosis not present

## 2021-01-08 DIAGNOSIS — F17200 Nicotine dependence, unspecified, uncomplicated: Secondary | ICD-10-CM | POA: Diagnosis not present

## 2021-01-08 NOTE — Progress Notes (Signed)
Patient missed his last appt.  He needs CT chest and follow up. Orders placed and scheduler aware to get him in .

## 2021-02-16 ENCOUNTER — Ambulatory Visit (HOSPITAL_COMMUNITY)
Admission: RE | Admit: 2021-02-16 | Discharge: 2021-02-16 | Disposition: A | Discharge status: Home / Self Care | Payer: Medicare PPO | Source: Ambulatory Visit | Attending: Hematology | Admitting: Hematology

## 2021-02-16 ENCOUNTER — Other Ambulatory Visit: Payer: Self-pay

## 2021-02-16 ENCOUNTER — Inpatient Hospital Stay (HOSPITAL_COMMUNITY): Payer: Medicare PPO | Attending: Hematology

## 2021-02-16 DIAGNOSIS — I10 Essential (primary) hypertension: Secondary | ICD-10-CM | POA: Diagnosis not present

## 2021-02-16 DIAGNOSIS — J439 Emphysema, unspecified: Secondary | ICD-10-CM | POA: Insufficient documentation

## 2021-02-16 DIAGNOSIS — D3502 Benign neoplasm of left adrenal gland: Secondary | ICD-10-CM | POA: Insufficient documentation

## 2021-02-16 DIAGNOSIS — Z79899 Other long term (current) drug therapy: Secondary | ICD-10-CM | POA: Insufficient documentation

## 2021-02-16 DIAGNOSIS — R911 Solitary pulmonary nodule: Secondary | ICD-10-CM | POA: Insufficient documentation

## 2021-02-16 DIAGNOSIS — Z85238 Personal history of other malignant neoplasm of thymus: Secondary | ICD-10-CM | POA: Diagnosis not present

## 2021-02-16 DIAGNOSIS — K760 Fatty (change of) liver, not elsewhere classified: Secondary | ICD-10-CM | POA: Diagnosis not present

## 2021-02-16 DIAGNOSIS — D4989 Neoplasm of unspecified behavior of other specified sites: Secondary | ICD-10-CM | POA: Insufficient documentation

## 2021-02-16 DIAGNOSIS — I700 Atherosclerosis of aorta: Secondary | ICD-10-CM | POA: Insufficient documentation

## 2021-02-16 DIAGNOSIS — F17210 Nicotine dependence, cigarettes, uncomplicated: Secondary | ICD-10-CM | POA: Diagnosis not present

## 2021-02-16 DIAGNOSIS — D3501 Benign neoplasm of right adrenal gland: Secondary | ICD-10-CM | POA: Diagnosis not present

## 2021-02-16 DIAGNOSIS — Z7982 Long term (current) use of aspirin: Secondary | ICD-10-CM | POA: Diagnosis not present

## 2021-02-16 DIAGNOSIS — I2510 Atherosclerotic heart disease of native coronary artery without angina pectoris: Secondary | ICD-10-CM | POA: Insufficient documentation

## 2021-02-16 LAB — COMPREHENSIVE METABOLIC PANEL
ALT: 11 U/L (ref 0–44)
AST: 17 U/L (ref 15–41)
Albumin: 3.6 g/dL (ref 3.5–5.0)
Alkaline Phosphatase: 60 U/L (ref 38–126)
Anion gap: 9 (ref 5–15)
BUN: 12 mg/dL (ref 8–23)
CO2: 25 mmol/L (ref 22–32)
Calcium: 9.3 mg/dL (ref 8.9–10.3)
Chloride: 101 mmol/L (ref 98–111)
Creatinine, Ser: 1.41 mg/dL — ABNORMAL HIGH (ref 0.61–1.24)
GFR, Estimated: 56 mL/min — ABNORMAL LOW (ref >60)
Glucose, Bld: 142 mg/dL — ABNORMAL HIGH (ref 70–99)
Potassium: 3.4 mmol/L — ABNORMAL LOW (ref 3.5–5.1)
Sodium: 135 mmol/L (ref 135–145)
Total Bilirubin: 0.5 mg/dL (ref 0.3–1.2)
Total Protein: 7.6 g/dL (ref 6.5–8.1)

## 2021-02-16 LAB — CBC WITH DIFFERENTIAL/PLATELET
Abs Immature Granulocytes: 0.03 10*3/uL (ref 0.00–0.07)
Basophils Absolute: 0.1 10*3/uL (ref 0.0–0.1)
Basophils Relative: 1 %
Eosinophils Absolute: 0.1 10*3/uL (ref 0.0–0.5)
Eosinophils Relative: 2 %
HCT: 47.9 % (ref 39.0–52.0)
Hemoglobin: 15 g/dL (ref 13.0–17.0)
Immature Granulocytes: 1 %
Lymphocytes Relative: 42 %
Lymphs Abs: 2.4 10*3/uL (ref 0.7–4.0)
MCH: 32 pg (ref 26.0–34.0)
MCHC: 31.3 g/dL (ref 30.0–36.0)
MCV: 102.1 fL — ABNORMAL HIGH (ref 80.0–100.0)
Monocytes Absolute: 0.5 10*3/uL (ref 0.1–1.0)
Monocytes Relative: 9 %
Neutro Abs: 2.6 10*3/uL (ref 1.7–7.7)
Neutrophils Relative %: 45 %
Platelets: 341 10*3/uL (ref 150–400)
RBC: 4.69 MIL/uL (ref 4.22–5.81)
RDW: 13.2 % (ref 11.5–15.5)
WBC: 5.7 10*3/uL (ref 4.0–10.5)
nRBC: 0 % (ref 0.0–0.2)

## 2021-02-16 MED ORDER — IOHEXOL 300 MG/ML  SOLN
75.0000 mL | Freq: Once | INTRAMUSCULAR | Status: AC | PRN
  Administered 2021-02-16: 75 mL via INTRAVENOUS

## 2021-02-24 ENCOUNTER — Inpatient Hospital Stay (HOSPITAL_COMMUNITY): Payer: Medicare PPO | Attending: Hematology | Admitting: Hematology

## 2021-03-08 DIAGNOSIS — F4310 Post-traumatic stress disorder, unspecified: Secondary | ICD-10-CM | POA: Diagnosis not present

## 2021-03-08 DIAGNOSIS — F17200 Nicotine dependence, unspecified, uncomplicated: Secondary | ICD-10-CM | POA: Diagnosis not present

## 2021-04-08 DIAGNOSIS — F4310 Post-traumatic stress disorder, unspecified: Secondary | ICD-10-CM | POA: Diagnosis not present

## 2021-04-08 DIAGNOSIS — I10 Essential (primary) hypertension: Secondary | ICD-10-CM | POA: Diagnosis not present

## 2021-05-08 DIAGNOSIS — F17200 Nicotine dependence, unspecified, uncomplicated: Secondary | ICD-10-CM | POA: Diagnosis not present

## 2021-05-08 DIAGNOSIS — I10 Essential (primary) hypertension: Secondary | ICD-10-CM | POA: Diagnosis not present

## 2021-05-14 DIAGNOSIS — F339 Major depressive disorder, recurrent, unspecified: Secondary | ICD-10-CM | POA: Diagnosis not present

## 2021-05-14 DIAGNOSIS — I10 Essential (primary) hypertension: Secondary | ICD-10-CM | POA: Diagnosis not present

## 2021-05-14 DIAGNOSIS — F17200 Nicotine dependence, unspecified, uncomplicated: Secondary | ICD-10-CM | POA: Diagnosis not present

## 2021-05-14 DIAGNOSIS — F4310 Post-traumatic stress disorder, unspecified: Secondary | ICD-10-CM | POA: Diagnosis not present

## 2021-05-21 DIAGNOSIS — I10 Essential (primary) hypertension: Secondary | ICD-10-CM | POA: Diagnosis not present

## 2021-12-09 ENCOUNTER — Other Ambulatory Visit (HOSPITAL_COMMUNITY): Payer: Self-pay

## 2021-12-09 DIAGNOSIS — D4989 Neoplasm of unspecified behavior of other specified sites: Secondary | ICD-10-CM

## 2021-12-10 ENCOUNTER — Inpatient Hospital Stay (HOSPITAL_COMMUNITY): Payer: Medicare Other | Attending: Hematology

## 2021-12-10 DIAGNOSIS — Z85238 Personal history of other malignant neoplasm of thymus: Secondary | ICD-10-CM | POA: Diagnosis not present

## 2021-12-10 DIAGNOSIS — N189 Chronic kidney disease, unspecified: Secondary | ICD-10-CM | POA: Diagnosis not present

## 2021-12-10 DIAGNOSIS — I129 Hypertensive chronic kidney disease with stage 1 through stage 4 chronic kidney disease, or unspecified chronic kidney disease: Secondary | ICD-10-CM | POA: Diagnosis not present

## 2021-12-10 DIAGNOSIS — F17210 Nicotine dependence, cigarettes, uncomplicated: Secondary | ICD-10-CM | POA: Insufficient documentation

## 2021-12-10 DIAGNOSIS — Z8673 Personal history of transient ischemic attack (TIA), and cerebral infarction without residual deficits: Secondary | ICD-10-CM | POA: Insufficient documentation

## 2021-12-10 DIAGNOSIS — D4989 Neoplasm of unspecified behavior of other specified sites: Secondary | ICD-10-CM

## 2021-12-10 DIAGNOSIS — Z7982 Long term (current) use of aspirin: Secondary | ICD-10-CM | POA: Diagnosis not present

## 2021-12-10 DIAGNOSIS — Z79899 Other long term (current) drug therapy: Secondary | ICD-10-CM | POA: Diagnosis not present

## 2021-12-10 LAB — CBC WITH DIFFERENTIAL/PLATELET
Abs Immature Granulocytes: 0.03 10*3/uL (ref 0.00–0.07)
Basophils Absolute: 0.1 10*3/uL (ref 0.0–0.1)
Basophils Relative: 2 %
Eosinophils Absolute: 0.1 10*3/uL (ref 0.0–0.5)
Eosinophils Relative: 2 %
HCT: 47.1 % (ref 39.0–52.0)
Hemoglobin: 15.4 g/dL (ref 13.0–17.0)
Immature Granulocytes: 1 %
Lymphocytes Relative: 44 %
Lymphs Abs: 2.4 10*3/uL (ref 0.7–4.0)
MCH: 33.5 pg (ref 26.0–34.0)
MCHC: 32.7 g/dL (ref 30.0–36.0)
MCV: 102.4 fL — ABNORMAL HIGH (ref 80.0–100.0)
Monocytes Absolute: 0.5 10*3/uL (ref 0.1–1.0)
Monocytes Relative: 10 %
Neutro Abs: 2.2 10*3/uL (ref 1.7–7.7)
Neutrophils Relative %: 41 %
Platelets: 332 10*3/uL (ref 150–400)
RBC: 4.6 MIL/uL (ref 4.22–5.81)
RDW: 13 % (ref 11.5–15.5)
WBC: 5.3 10*3/uL (ref 4.0–10.5)
nRBC: 0 % (ref 0.0–0.2)

## 2021-12-10 LAB — COMPREHENSIVE METABOLIC PANEL
ALT: 14 U/L (ref 0–44)
AST: 16 U/L (ref 15–41)
Albumin: 3.6 g/dL (ref 3.5–5.0)
Alkaline Phosphatase: 63 U/L (ref 38–126)
Anion gap: 7 (ref 5–15)
BUN: 14 mg/dL (ref 8–23)
CO2: 26 mmol/L (ref 22–32)
Calcium: 8.9 mg/dL (ref 8.9–10.3)
Chloride: 103 mmol/L (ref 98–111)
Creatinine, Ser: 1.28 mg/dL — ABNORMAL HIGH (ref 0.61–1.24)
GFR, Estimated: >60 mL/min (ref >60)
Glucose, Bld: 114 mg/dL — ABNORMAL HIGH (ref 70–99)
Potassium: 4 mmol/L (ref 3.5–5.1)
Sodium: 136 mmol/L (ref 135–145)
Total Bilirubin: 0.4 mg/dL (ref 0.3–1.2)
Total Protein: 7.4 g/dL (ref 6.5–8.1)

## 2021-12-16 NOTE — Progress Notes (Signed)
[place], [place] [address]   CLINIC:  Medical Oncology/Hematology  PCP:  Rosita Fire, MD [place] / [place] [id number] [phone]   REASON FOR VISIT:  Follow-up for thymic carcinoma.  PRIOR THERAPY: none  CURRENT THERAPY: surveillance  BRIEF ONCOLOGIC HISTORY:  Oncology History   No history exists.    CANCER STAGING:  Cancer Staging  No matching staging information was found for the patient.  INTERVAL HISTORY:  Derrick Gaines, a 65 y.o. male, returns for routine follow-up of his thymic carcinoma. Derrick Gaines was last seen on 06/05/2018.   Today he reports feeling good. He denies weight loss. He continues smoking and smokes 1 ppd.   REVIEW OF SYSTEMS:  Review of Systems  Constitutional:  Negative for appetite change, fatigue and unexpected weight change.  All other systems reviewed and are negative.  PAST MEDICAL/SURGICAL HISTORY:  Past Medical History:  Diagnosis Date   Arthritis    Cancer ([place])    Chronic kidney disease    Hypertension    Stroke ([place])    mild left sided weakness   Vocal cord polyp    Past Surgical History:  Procedure Laterality Date   BACK SURGERY     MICROLARYNGOSCOPY N/A 08/29/2018   Procedure: DIRECT MICROLARYNGOSCOPY WITH EXCISION OF LARYNGEAL MASS;  Surgeon: Leta Baptist, MD;  Location: [facility];  Service: ENT;  Laterality: N/A;   STERNOTOMY     THYMECTOMY      SOCIAL HISTORY:  Social History   Socioeconomic History   Marital status: Married    Spouse name: Not on file   Number of children: Not on file   Years of education: Not on file   Highest education level: Not on file  Occupational History   Not on file  Tobacco Use   Smoking status: Every Day    Packs/day: 0.50    Years: 30.00    Pack years: 15.00    Types: Cigarettes   Smokeless tobacco: Never  Vaping Use   Vaping Use: Never used  Substance and Sexual Activity   Alcohol use: No   Drug  use: No   Sexual activity: Yes  Other Topics Concern   Not on file  Social History Narrative   Been on disability since age 32 due to a stroke.    Used to work at [employer].    Married. No children. Married for 15 years.   Smoke cigarettes, since age 23 years.    No alcohol. No drugs.   Enjoys fishing.    Attends church.   Eats all foods.   Wear seatbelt.    Drives.    Social Determinants of Health   Financial Resource Strain: Not on file  Food Insecurity: Not on file  Transportation Needs: Not on file  Physical Activity: Not on file  Stress: Not on file  Social Connections: Not on file  Intimate Partner Violence: Not on file    FAMILY HISTORY:  Family History  Problem Relation Age of Onset   Diabetes Mother    Cancer Mother    Colon cancer Sister    Pneumonia Father    Emphysema Brother     CURRENT MEDICATIONS:  Current Outpatient Medications  Medication Sig Dispense Refill   amLODipine (NORVASC) 10 MG tablet Take 1 tablet (10 mg total) by mouth daily. 90 tablet 0   aspirin EC 81 MG tablet Take 1 tablet (81 mg total) by mouth daily.  Aspirin-Salicylamide-Caffeine (ARTHRITIS STRENGTH BC POWDER PO) Take by mouth.     atorvastatin (LIPITOR) 20 MG tablet Take 1 tablet (20 mg total) by mouth daily. 90 tablet 3   IBU 800 MG tablet Take 800 mg by mouth 2 (two) times daily.     losartan (COZAAR) 100 MG tablet Take 1 tablet (100 mg total) by mouth daily. 90 tablet 3   sertraline (ZOLOFT) 100 MG tablet Take 100 mg by mouth daily.     No current facility-administered medications for this visit.    ALLERGIES:  No Known Allergies  PHYSICAL EXAM:  Performance status (ECOG): 1 - Symptomatic but completely ambulatory  Vitals:   12/17/21 1135  BP: (!) 165/102  Pulse: 87  Resp: 19  Temp: 98.7 F (37.1 C)  SpO2: 97%   Wt Readings from Last 3 Encounters:  12/17/21 260 lb 2.3 oz (118 kg)  11/20/20 280 lb (127 kg)  06/19/20 256 lb 14.4 oz (116.5 kg)   Physical  Exam Vitals reviewed.  Constitutional:      Appearance: Normal appearance.  Cardiovascular:     Rate and Rhythm: Normal rate and regular rhythm.     Pulses: Normal pulses.     Heart sounds: Normal heart sounds.  Pulmonary:     Effort: Pulmonary effort is normal.     Breath sounds: Normal breath sounds.  Neurological:     General: No focal deficit present.     Mental Status: He is alert and oriented to person, place, and time.  Psychiatric:        Mood and Affect: Mood normal.        Behavior: Behavior normal.     LABORATORY DATA:  I have reviewed the labs as listed.  CBC Latest Ref Rng & Units 12/10/2021 02/16/2021 11/21/2020  WBC 4.0 - 10.5 K/uL 5.3 5.7 3.6(L)  Hemoglobin 13.0 - 17.0 g/dL 15.4 15.0 13.7  Hematocrit 39.0 - 52.0 % 47.1 47.9 44.2  Platelets 150 - 400 K/uL 332 341 235   CMP Latest Ref Rng & Units 12/10/2021 02/16/2021 11/21/2020  Glucose 70 - 99 mg/dL 114(H) 142(H) 177(H)  BUN 8 - 23 mg/dL 14 12 21   Creatinine 0.61 - 1.24 mg/dL 1.28(H) 1.41(H) 1.65(H)  Sodium 135 - 145 mmol/L 136 135 136  Potassium 3.5 - 5.1 mmol/L 4.0 3.4(L) 3.8  Chloride 98 - 111 mmol/L 103 101 104  CO2 22 - 32 mmol/L 26 25 26   Calcium 8.9 - 10.3 mg/dL 8.9 9.3 8.1(L)  Total Protein 6.5 - 8.1 g/dL 7.4 7.6 6.8  Total Bilirubin 0.3 - 1.2 mg/dL 0.4 0.5 0.5  Alkaline Phos 38 - 126 U/L 63 60 48  AST 15 - 41 U/L 16 17 47(H)  ALT 0 - 44 U/L 14 11 23     DIAGNOSTIC IMAGING:  I have independently reviewed the scans and discussed with the patient. No results found.   ASSESSMENT:  1.  Poorly differentiated thymic carcinoma: -Presentation with weight loss of 40 pounds, PET/CT scan on 10/05/2016 showed anterior mediastinal mass measuring 3.6 x 1.9 cm with SUV of 6.3, status post resection on 12/29/2016 -I have obtained and reviewed the pathology report dated 12/29/2016 which showed poorly differentiated thymic carcinoma, with foci of squamous differentiation, negative margin for lung tissue, negative  margin for pericardial and pleura, one lymph node negative, pT2pN0M0, adjuvant chemoradiation therapy was recommended, patient declined.   -Last follow-up at [facility] hospital in December, CT scan of the chest with contrast on 12/14/2017 shows no evidence  of recurrence or metastatic disease.  Scattered small pulmonary nodules are unchanged from 09/22/2016.   -Family history with one sister died of colon cancer in her 62s, another sister with metastatic cancer at this time    2.  Tobacco abuse: Patient smoked 1 pack/day for the past 44 years.  He reports that he cut back to half pack per day since his surgery.    PLAN:  1.  Poorly differentiated thymic carcinoma: - He does not have any B symptoms. - Reviewed CT chest with contrast from 02/17/2021 which showed stable appearance of subtle soft tissue within the anterior mediastinum consistent with postsurgical change.  No findings to suggest local recurrence.  Subtle subpleural nodule in the left lung apex demonstrates mild, gradual increase in size compared with 05/01/2018.  This now has a maximum dimension of 1.3 cm versus 0.8 cm previously. - I have reviewed his labs from 12/05/2021.  Creatinine has improved to 1.26.  LFTs are normal.  CBC was grossly normal. - Recommend RTC 3 months with repeat CT scan of the chest.   Orders placed this encounter:  No orders of the defined types were placed in this encounter.    Derek Jack, MD [place] [phone]   I, Thana Ates, am acting as a scribe for Dr. Derek Jack.  .[email or web address]

## 2021-12-17 ENCOUNTER — Other Ambulatory Visit: Payer: Self-pay

## 2021-12-17 ENCOUNTER — Inpatient Hospital Stay (HOSPITAL_BASED_OUTPATIENT_CLINIC_OR_DEPARTMENT_OTHER): Payer: Medicare Other | Admitting: Hematology

## 2021-12-17 VITALS — BP 165/102 | HR 87 | Temp 98.7°F | Resp 19 | Ht 73.0 in | Wt 260.1 lb

## 2021-12-17 DIAGNOSIS — D4989 Neoplasm of unspecified behavior of other specified sites: Secondary | ICD-10-CM | POA: Diagnosis not present

## 2021-12-17 DIAGNOSIS — Z85238 Personal history of other malignant neoplasm of thymus: Secondary | ICD-10-CM | POA: Diagnosis not present

## 2022-01-28 ENCOUNTER — Other Ambulatory Visit: Payer: Self-pay | Admitting: Gerontology

## 2022-01-28 ENCOUNTER — Other Ambulatory Visit (HOSPITAL_COMMUNITY): Payer: Self-pay | Admitting: Gerontology

## 2022-01-28 DIAGNOSIS — N183 Chronic kidney disease, stage 3 unspecified: Secondary | ICD-10-CM

## 2022-02-15 ENCOUNTER — Ambulatory Visit (HOSPITAL_COMMUNITY)
Admission: RE | Admit: 2022-02-15 | Discharge: 2022-02-15 | Disposition: A | Discharge status: Home / Self Care | Payer: Medicare Other | Source: Ambulatory Visit | Attending: Gerontology | Admitting: Gerontology

## 2022-02-15 ENCOUNTER — Other Ambulatory Visit: Payer: Self-pay

## 2022-02-15 DIAGNOSIS — N183 Chronic kidney disease, stage 3 unspecified: Secondary | ICD-10-CM | POA: Insufficient documentation

## 2022-02-15 DIAGNOSIS — N281 Cyst of kidney, acquired: Secondary | ICD-10-CM | POA: Diagnosis not present

## 2022-02-20 DIAGNOSIS — I10 Essential (primary) hypertension: Secondary | ICD-10-CM | POA: Diagnosis not present

## 2022-02-20 DIAGNOSIS — I6350 Cerebral infarction due to unspecified occlusion or stenosis of unspecified cerebral artery: Secondary | ICD-10-CM | POA: Diagnosis not present

## 2022-03-18 DIAGNOSIS — C37 Malignant neoplasm of thymus: Secondary | ICD-10-CM | POA: Diagnosis not present

## 2022-03-18 DIAGNOSIS — N281 Cyst of kidney, acquired: Secondary | ICD-10-CM | POA: Diagnosis not present

## 2022-03-18 DIAGNOSIS — I129 Hypertensive chronic kidney disease with stage 1 through stage 4 chronic kidney disease, or unspecified chronic kidney disease: Secondary | ICD-10-CM | POA: Diagnosis not present

## 2022-03-18 DIAGNOSIS — N1832 Chronic kidney disease, stage 3b: Secondary | ICD-10-CM | POA: Diagnosis not present

## 2022-03-18 DIAGNOSIS — R809 Proteinuria, unspecified: Secondary | ICD-10-CM | POA: Diagnosis not present

## 2022-03-18 DIAGNOSIS — I160 Hypertensive urgency: Secondary | ICD-10-CM | POA: Diagnosis not present

## 2022-03-20 DIAGNOSIS — I10 Essential (primary) hypertension: Secondary | ICD-10-CM | POA: Diagnosis not present

## 2022-04-01 ENCOUNTER — Other Ambulatory Visit (HOSPITAL_COMMUNITY): Payer: Self-pay | Admitting: Nephrology

## 2022-04-01 ENCOUNTER — Other Ambulatory Visit: Payer: Self-pay | Admitting: Nephrology

## 2022-04-01 DIAGNOSIS — N1832 Chronic kidney disease, stage 3b: Secondary | ICD-10-CM

## 2022-04-01 DIAGNOSIS — I129 Hypertensive chronic kidney disease with stage 1 through stage 4 chronic kidney disease, or unspecified chronic kidney disease: Secondary | ICD-10-CM

## 2022-04-01 DIAGNOSIS — N281 Cyst of kidney, acquired: Secondary | ICD-10-CM

## 2022-04-01 DIAGNOSIS — R809 Proteinuria, unspecified: Secondary | ICD-10-CM

## 2022-04-07 DIAGNOSIS — N281 Cyst of kidney, acquired: Secondary | ICD-10-CM | POA: Diagnosis not present

## 2022-04-07 DIAGNOSIS — I129 Hypertensive chronic kidney disease with stage 1 through stage 4 chronic kidney disease, or unspecified chronic kidney disease: Secondary | ICD-10-CM | POA: Diagnosis not present

## 2022-04-07 DIAGNOSIS — E559 Vitamin D deficiency, unspecified: Secondary | ICD-10-CM | POA: Diagnosis not present

## 2022-04-07 DIAGNOSIS — R809 Proteinuria, unspecified: Secondary | ICD-10-CM | POA: Diagnosis not present

## 2022-04-07 DIAGNOSIS — C37 Malignant neoplasm of thymus: Secondary | ICD-10-CM | POA: Diagnosis not present

## 2022-04-07 DIAGNOSIS — Z79899 Other long term (current) drug therapy: Secondary | ICD-10-CM | POA: Diagnosis not present

## 2022-04-07 DIAGNOSIS — N1832 Chronic kidney disease, stage 3b: Secondary | ICD-10-CM | POA: Diagnosis not present

## 2022-04-14 ENCOUNTER — Ambulatory Visit (HOSPITAL_COMMUNITY)
Admission: RE | Admit: 2022-04-14 | Discharge: 2022-04-14 | Disposition: A | Discharge status: Home / Self Care | Payer: Medicare Other | Source: Ambulatory Visit | Attending: Nephrology | Admitting: Nephrology

## 2022-04-14 DIAGNOSIS — N281 Cyst of kidney, acquired: Secondary | ICD-10-CM | POA: Insufficient documentation

## 2022-04-14 DIAGNOSIS — R809 Proteinuria, unspecified: Secondary | ICD-10-CM | POA: Diagnosis not present

## 2022-04-14 DIAGNOSIS — I129 Hypertensive chronic kidney disease with stage 1 through stage 4 chronic kidney disease, or unspecified chronic kidney disease: Secondary | ICD-10-CM | POA: Diagnosis not present

## 2022-04-14 DIAGNOSIS — N1832 Chronic kidney disease, stage 3b: Secondary | ICD-10-CM | POA: Insufficient documentation

## 2022-04-20 DIAGNOSIS — I10 Essential (primary) hypertension: Secondary | ICD-10-CM | POA: Diagnosis not present

## 2022-04-22 DIAGNOSIS — E559 Vitamin D deficiency, unspecified: Secondary | ICD-10-CM | POA: Diagnosis not present

## 2022-04-22 DIAGNOSIS — N281 Cyst of kidney, acquired: Secondary | ICD-10-CM | POA: Diagnosis not present

## 2022-04-22 DIAGNOSIS — N1832 Chronic kidney disease, stage 3b: Secondary | ICD-10-CM | POA: Diagnosis not present

## 2022-04-22 DIAGNOSIS — R771 Abnormality of globulin: Secondary | ICD-10-CM | POA: Diagnosis not present

## 2022-04-22 DIAGNOSIS — D472 Monoclonal gammopathy: Secondary | ICD-10-CM | POA: Diagnosis not present

## 2022-04-22 DIAGNOSIS — I129 Hypertensive chronic kidney disease with stage 1 through stage 4 chronic kidney disease, or unspecified chronic kidney disease: Secondary | ICD-10-CM | POA: Diagnosis not present

## 2022-04-22 DIAGNOSIS — C37 Malignant neoplasm of thymus: Secondary | ICD-10-CM | POA: Diagnosis not present

## 2022-04-22 DIAGNOSIS — E211 Secondary hyperparathyroidism, not elsewhere classified: Secondary | ICD-10-CM | POA: Diagnosis not present

## 2022-04-22 DIAGNOSIS — R809 Proteinuria, unspecified: Secondary | ICD-10-CM | POA: Diagnosis not present

## 2022-06-04 ENCOUNTER — Inpatient Hospital Stay (HOSPITAL_COMMUNITY): Payer: Medicare PPO

## 2022-06-04 ENCOUNTER — Encounter (HOSPITAL_COMMUNITY): Payer: Self-pay | Admitting: Hematology

## 2022-06-04 ENCOUNTER — Inpatient Hospital Stay (HOSPITAL_COMMUNITY): Payer: Medicare PPO | Attending: Hematology | Admitting: Hematology

## 2022-06-04 DIAGNOSIS — N189 Chronic kidney disease, unspecified: Secondary | ICD-10-CM | POA: Diagnosis not present

## 2022-06-04 DIAGNOSIS — D4989 Neoplasm of unspecified behavior of other specified sites: Secondary | ICD-10-CM

## 2022-06-04 DIAGNOSIS — F17210 Nicotine dependence, cigarettes, uncomplicated: Secondary | ICD-10-CM | POA: Diagnosis not present

## 2022-06-04 DIAGNOSIS — Z79899 Other long term (current) drug therapy: Secondary | ICD-10-CM | POA: Insufficient documentation

## 2022-06-04 DIAGNOSIS — I129 Hypertensive chronic kidney disease with stage 1 through stage 4 chronic kidney disease, or unspecified chronic kidney disease: Secondary | ICD-10-CM | POA: Diagnosis not present

## 2022-06-04 DIAGNOSIS — Z85238 Personal history of other malignant neoplasm of thymus: Secondary | ICD-10-CM | POA: Insufficient documentation

## 2022-06-04 DIAGNOSIS — R768 Other specified abnormal immunological findings in serum: Secondary | ICD-10-CM | POA: Diagnosis not present

## 2022-06-04 LAB — CBC WITH DIFFERENTIAL/PLATELET
Abs Immature Granulocytes: 0.04 10*3/uL (ref 0.00–0.07)
Basophils Absolute: 0.1 10*3/uL (ref 0.0–0.1)
Basophils Relative: 1 %
Eosinophils Absolute: 0.2 10*3/uL (ref 0.0–0.5)
Eosinophils Relative: 4 %
HCT: 48.7 % (ref 39.0–52.0)
Hemoglobin: 15.9 g/dL (ref 13.0–17.0)
Immature Granulocytes: 1 %
Lymphocytes Relative: 34 %
Lymphs Abs: 2 10*3/uL (ref 0.7–4.0)
MCH: 32.8 pg (ref 26.0–34.0)
MCHC: 32.6 g/dL (ref 30.0–36.0)
MCV: 100.4 fL — ABNORMAL HIGH (ref 80.0–100.0)
Monocytes Absolute: 0.5 10*3/uL (ref 0.1–1.0)
Monocytes Relative: 9 %
Neutro Abs: 3 10*3/uL (ref 1.7–7.7)
Neutrophils Relative %: 51 %
Platelets: 328 10*3/uL (ref 150–400)
RBC: 4.85 MIL/uL (ref 4.22–5.81)
RDW: 12.9 % (ref 11.5–15.5)
WBC: 5.9 10*3/uL (ref 4.0–10.5)
nRBC: 0 % (ref 0.0–0.2)

## 2022-06-04 LAB — COMPREHENSIVE METABOLIC PANEL
ALT: 14 U/L (ref 0–44)
AST: 20 U/L (ref 15–41)
Albumin: 3.8 g/dL (ref 3.5–5.0)
Alkaline Phosphatase: 76 U/L (ref 38–126)
Anion gap: 4 — ABNORMAL LOW (ref 5–15)
BUN: 11 mg/dL (ref 8–23)
CO2: 28 mmol/L (ref 22–32)
Calcium: 9 mg/dL (ref 8.9–10.3)
Chloride: 105 mmol/L (ref 98–111)
Creatinine, Ser: 1.39 mg/dL — ABNORMAL HIGH (ref 0.61–1.24)
GFR, Estimated: 57 mL/min — ABNORMAL LOW (ref >60)
Glucose, Bld: 116 mg/dL — ABNORMAL HIGH (ref 70–99)
Potassium: 3.9 mmol/L (ref 3.5–5.1)
Sodium: 137 mmol/L (ref 135–145)
Total Bilirubin: 0.5 mg/dL (ref 0.3–1.2)
Total Protein: 7.6 g/dL (ref 6.5–8.1)

## 2022-06-04 LAB — LACTATE DEHYDROGENASE: LDH: 132 U/L (ref 98–192)

## 2022-06-04 NOTE — Patient Instructions (Signed)
[place] at [facility] Discharge Instructions  You were seen and examined today by Dr. Delton Coombes. Dr. Delton Coombes is a hematologist, meaning that he specializes in blood abnormalities. Dr. Delton Coombes discussed your past medical history, family history of cancers/blood conditions and the events that led to you being here today.  You were referred back to Dr. Delton Coombes due to an abnormal protein presence in your blood. This is known as Monoclonal Gammopathy of Unknown Significance. This places you at a higher risk for developing Multiple Myeloma, and for this reason it requires regular monitoring.  Dr. Delton Coombes has recommended additional labs today. Dr. Delton Coombes has also recommended obtaining a 24hr urine sample. You should also have a whole body bone x-ray because the abnormal protein can impact your bone health.  Follow-up with Dr. Delton Coombes as scheduled.   Thank you for choosing [place] at [facility] to provide your oncology and hematology care.  To afford each patient quality time with our provider, please arrive at least 15 minutes before your scheduled appointment time.   If you have a lab appointment with the [facility] please come in thru the Main Entrance and check in at the main information desk.  You need to re-schedule your appointment should you arrive 10 or more minutes late.  We strive to give you quality time with our providers, and arriving late affects you and other patients whose appointments are after yours.  Also, if you no show three or more times for appointments you may be dismissed from the clinic at the providers discretion.     Again, thank you for choosing [facility].  Our hope is that these requests will decrease the amount of time that you wait before being seen by our physicians.       _____________________________________________________________  Should you have questions after your visit to  [facility], please contact our office at [phone] and follow the prompts.  Our office hours are 8:00 a.m. and 4:30 p.m. Monday - Friday.  Please note that voicemails left after 4:00 p.m. may not be returned until the following business day.  We are closed weekends and major holidays.  You do have access to a nurse 24-7, just call the main number to the clinic [phone] and do not press any options, hold on the line and a nurse will answer the phone.    For prescription refill requests, have your pharmacy contact our office and allow 72 hours.    Due to Covid, you will need to wear a mask upon entering the hospital. If you do not have a mask, a mask will be given to you at the Main Entrance upon arrival. For doctor visits, patients may have 1 support person age 74 or older with them. For treatment visits, patients can not have anyone with them due to social distancing guidelines and our immunocompromised population.

## 2022-06-04 NOTE — Progress Notes (Signed)
[place] [address] [place] [address]   CLINIC:  Medical Oncology/Hematology  Patient Care Team: Carrolyn Meiers, MD as PCP - General (Internal Medicine) Derek Jack, MD as Consulting Physician (Hematology)  CHIEF COMPLAINTS/PURPOSE OF CONSULTATION:  Evaluation for abnormal free light chains  HISTORY OF PRESENTING ILLNESS:  Derrick Gaines 66 y.o. male is here because of evaluation for free light chains, at the request of Dr. Theador Hawthorne.  Today she reports feeling good. He denies tingling/numbness in his hands and feet. He denies headaches, vision changes, bone pains, infections, and weight loss. He denies history of MI. He reports he had a stroke.  He denies family history of myeloma. One sister had colon cancer, and another sister had breast cancer,. Prior to retirement he worked at a [employer]. He denies chemical exposure. He currently smokes, and has smoked 1 ppd since he was 66 years old.   MEDICAL HISTORY:  Past Medical History:  Diagnosis Date   Arthritis    Cancer ([place])    Chronic kidney disease    Hypertension    Stroke ([place])    mild left sided weakness   Vocal cord polyp     SURGICAL HISTORY: Past Surgical History:  Procedure Laterality Date   BACK SURGERY     MICROLARYNGOSCOPY N/A 08/29/2018   Procedure: DIRECT MICROLARYNGOSCOPY WITH EXCISION OF LARYNGEAL MASS;  Surgeon: Leta Baptist, MD;  Location: [facility];  Service: ENT;  Laterality: N/A;   STERNOTOMY     THYMECTOMY      SOCIAL HISTORY: Social History   Socioeconomic History   Marital status: Married    Spouse name: Not on file   Number of children: Not on file   Years of education: Not on file   Highest education level: Not on file  Occupational History   Not on file  Tobacco Use   Smoking status: Every Day    Packs/day: 0.50    Years: 30.00    Total pack years: 15.00    Types: Cigarettes   Smokeless tobacco: Never  Vaping Use   Vaping  Use: Never used  Substance and Sexual Activity   Alcohol use: No   Drug use: No   Sexual activity: Yes  Other Topics Concern   Not on file  Social History Narrative   Been on disability since age 21 due to a stroke.    Used to work at [employer].    Married. No children. Married for 15 years.   Smoke cigarettes, since age 64 years.    No alcohol. No drugs.   Enjoys fishing.    Attends church.   Eats all foods.   Wear seatbelt.    Drives.    Social Determinants of Health   Financial Resource Strain: Not on file  Food Insecurity: Not on file  Transportation Needs: Not on file  Physical Activity: Not on file  Stress: Not on file  Social Connections: Not on file  Intimate Partner Violence: Not on file    FAMILY HISTORY: Family History  Problem Relation Age of Onset   Diabetes Mother    Cancer Mother    Colon cancer Sister    Pneumonia Father    Emphysema Brother     ALLERGIES:  has No Known Allergies.  MEDICATIONS:  Current Outpatient Medications  Medication Sig Dispense Refill   amLODipine (NORVASC) 10 MG tablet Take 1 tablet (10 mg total) by mouth daily. 90 tablet 0   aspirin  EC 81 MG tablet Take 1 tablet (81 mg total) by mouth daily.     Aspirin-Salicylamide-Caffeine (ARTHRITIS STRENGTH BC POWDER PO) Take by mouth.     atorvastatin (LIPITOR) 20 MG tablet Take 1 tablet (20 mg total) by mouth daily. 90 tablet 3   chlorthalidone (HYGROTON) 25 MG tablet Take by mouth.     Cholecalciferol 50 MCG (2000 UT) CAPS Take by mouth.     IBU 800 MG tablet Take 800 mg by mouth 2 (two) times daily.     losartan (COZAAR) 100 MG tablet Take 1 tablet (100 mg total) by mouth daily. 90 tablet 3   sertraline (ZOLOFT) 100 MG tablet Take 100 mg by mouth daily.     No current facility-administered medications for this visit.    REVIEW OF SYSTEMS:   Review of Systems  Constitutional:  Negative for appetite change, fatigue, fever and unexpected weight change.  Eyes:  Negative for eye  problems.  Musculoskeletal:  Negative for arthralgias.  Neurological:  Negative for headaches.  All other systems reviewed and are negative.    PHYSICAL EXAMINATION: ECOG PERFORMANCE STATUS: 1 - Symptomatic but completely ambulatory  Vitals:   06/04/22 1207  Pulse: 84  Resp: 18  Temp: 98.2 F (36.8 C)  SpO2: 98%   Filed Weights   06/04/22 1207  Weight: 257 lb 9.6 oz (116.8 kg)   Physical Exam Vitals reviewed.  Constitutional:      Appearance: Normal appearance. He is obese.  Cardiovascular:     Rate and Rhythm: Normal rate and regular rhythm.     Pulses: Normal pulses.     Heart sounds: Normal heart sounds.  Pulmonary:     Effort: Pulmonary effort is normal.     Breath sounds: Normal breath sounds.  Abdominal:     Palpations: Abdomen is soft. There is no hepatomegaly, splenomegaly or mass.     Tenderness: There is no abdominal tenderness.  Musculoskeletal:     Right lower leg: No edema.     Left lower leg: No edema.  Lymphadenopathy:     Upper Body:     Right upper body: No supraclavicular or axillary adenopathy.     Left upper body: No supraclavicular or axillary adenopathy.     Lower Body: No right inguinal adenopathy. No left inguinal adenopathy.  Neurological:     General: No focal deficit present.     Mental Status: He is alert and oriented to person, place, and time.  Psychiatric:        Mood and Affect: Mood normal.        Behavior: Behavior normal.      LABORATORY DATA:  I have reviewed the data as listed No results found for this or any previous visit (from the past 2160 hour(s)).  RADIOGRAPHIC STUDIES: I have personally reviewed the radiological images as listed and agreed with the findings in the report. No results found.  ASSESSMENT:  Abnormal free light chains:  - Patient seen at the request of Dr. Theador Hawthorne for abnormal free light chains. - Labs on 04/07/2021: SPEP-poorly defined band of restricted protein mobility in the gamma region.   SIFX-negative.  FLC ratio 4.22, kappa light chains 137, lambda light chain 32.5.  Creatinine was 1.41.  Hemoglobin was normal. - She denies any tingling or numbness next 20s.  No new onset bone pains.  No infections.  Poorly differentiated thymic carcinoma: -Presentation with weight loss of 40 pounds, PET/CT scan on 10/05/2016 showed anterior mediastinal mass measuring 3.6  x 1.9 cm with SUV of 6.3, status post resection on 12/29/2016 -Pathology (12/29/2016): Poorly differentiated thymic carcinoma, with foci of squamous differentiation, negative margin for lung tissue, negative margin for pericardial and pleura, one lymph node negative, pT2pN0M0, adjuvant chemoradiation therapy was recommended, patient declined.  -Last follow-up at [facility] hospital in December, CT scan of the chest with contrast on 12/14/2017 shows no evidence of recurrence or metastatic disease.  Scattered small pulmonary nodules are unchanged from 09/22/2016. - CT chest with contrast (02/17/2021): Stable appearance of subtle soft tissue within the anterior mediastinum consistent with postsurgical change.  No findings to suggest local recurrence.  Subtle subpleural nodule in the left lung apex demonstrates mild gradual increase in size compared to 05/01/2018.  Measures 1.3 cm, versus 0.8 cm previously.  Social/family history: - He is currently on disability.  He worked in [employer].  No chemical exposure.  Current active smoker, 1 pack/day for 38 years. - Sister breast cancer.  Another sister had colon cancer.   PLAN:  Abnormal free light chains: - We discussed the spectrum of plasma cell disorders.  Abnormal free light chain ratio due to elevated kappa light chains, disproportionate to the degree of CKD.  Hence he will need further work-up. - We will check skeletal survey.  24-hour urine for UPEP, urine immunofixation and free light chains will be sent. - We will also check CBC, CMP, LDH, beta-2 microglobulin and repeat serum free light  chain ratio. - RTC 2 to 3 weeks for follow-up.  Poorly differentiated thymic carcinoma: -He does not have any clinical signs of recurrence at this time. - We will schedule him for a last CT scan of his chest with contrast.   All questions were answered. The patient knows to call the clinic with any problems, questions or concerns.  Derek Jack, MD 06/04/22 12:49 PM  [place] [phone]   I, Thana Ates, am acting as a scribe for Dr. Derek Jack.  I, Derek Jack MD, have reviewed the above documentation for accuracy and completeness, and I agree with the above.

## 2022-06-05 LAB — BETA 2 MICROGLOBULIN, SERUM: Beta-2 Microglobulin: 2.1 mg/L (ref 0.6–2.4)

## 2022-06-07 LAB — KAPPA/LAMBDA LIGHT CHAINS
Kappa free light chain: 152.4 mg/L — ABNORMAL HIGH (ref 3.3–19.4)
Kappa, lambda light chain ratio: 4.76 — ABNORMAL HIGH (ref 0.26–1.65)
Lambda free light chains: 32 mg/L — ABNORMAL HIGH (ref 5.7–26.3)

## 2022-06-20 DIAGNOSIS — I10 Essential (primary) hypertension: Secondary | ICD-10-CM | POA: Diagnosis not present

## 2022-06-20 DIAGNOSIS — M1990 Unspecified osteoarthritis, unspecified site: Secondary | ICD-10-CM | POA: Diagnosis not present

## 2022-06-21 ENCOUNTER — Ambulatory Visit (HOSPITAL_COMMUNITY): Admission: RE | Admit: 2022-06-21 | Payer: Medicare PPO | Source: Ambulatory Visit

## 2022-06-21 ENCOUNTER — Other Ambulatory Visit (HOSPITAL_COMMUNITY): Payer: Self-pay

## 2022-06-21 ENCOUNTER — Other Ambulatory Visit (HOSPITAL_COMMUNITY): Payer: Medicare Other

## 2022-06-21 ENCOUNTER — Ambulatory Visit: Payer: Medicare Other

## 2022-06-21 DIAGNOSIS — Z79899 Other long term (current) drug therapy: Secondary | ICD-10-CM | POA: Diagnosis not present

## 2022-06-21 DIAGNOSIS — F17210 Nicotine dependence, cigarettes, uncomplicated: Secondary | ICD-10-CM | POA: Diagnosis not present

## 2022-06-21 DIAGNOSIS — N189 Chronic kidney disease, unspecified: Secondary | ICD-10-CM | POA: Diagnosis not present

## 2022-06-21 DIAGNOSIS — Z85238 Personal history of other malignant neoplasm of thymus: Secondary | ICD-10-CM | POA: Diagnosis not present

## 2022-06-21 DIAGNOSIS — R768 Other specified abnormal immunological findings in serum: Secondary | ICD-10-CM

## 2022-06-21 DIAGNOSIS — I129 Hypertensive chronic kidney disease with stage 1 through stage 4 chronic kidney disease, or unspecified chronic kidney disease: Secondary | ICD-10-CM | POA: Diagnosis not present

## 2022-06-23 LAB — UPEP/UIFE/LIGHT CHAINS/TP, 24-HR UR
% BETA, Urine: 30 %
ALPHA 1 URINE: 5.6 %
Albumin, U: 53.2 %
Alpha 2, Urine: 6 %
Free Kappa Lt Chains,Ur: 51.83 mg/L (ref 1.17–86.46)
Free Kappa/Lambda Ratio: 31.41 — ABNORMAL HIGH (ref 1.83–14.26)
Free Lambda Lt Chains,Ur: 1.65 mg/L (ref 0.27–15.21)
GAMMA GLOBULIN URINE: 5.1 %
Total Protein, Urine-Ur/day: 803 mg/24 hr — ABNORMAL HIGH (ref 30–150)
Total Protein, Urine: 59.5 mg/dL
Total Volume: 1350

## 2022-06-28 ENCOUNTER — Ambulatory Visit (HOSPITAL_COMMUNITY): Payer: Medicare Other | Admitting: Hematology

## 2022-06-30 DIAGNOSIS — D472 Monoclonal gammopathy: Secondary | ICD-10-CM | POA: Diagnosis not present

## 2022-06-30 DIAGNOSIS — N1832 Chronic kidney disease, stage 3b: Secondary | ICD-10-CM | POA: Diagnosis not present

## 2022-06-30 DIAGNOSIS — R809 Proteinuria, unspecified: Secondary | ICD-10-CM | POA: Diagnosis not present

## 2022-06-30 DIAGNOSIS — I129 Hypertensive chronic kidney disease with stage 1 through stage 4 chronic kidney disease, or unspecified chronic kidney disease: Secondary | ICD-10-CM | POA: Diagnosis not present

## 2022-07-01 ENCOUNTER — Ambulatory Visit (HOSPITAL_COMMUNITY): Payer: Self-pay | Admitting: Hematology

## 2022-07-07 ENCOUNTER — Ambulatory Visit (HOSPITAL_COMMUNITY): Payer: Self-pay | Admitting: Hematology

## 2022-07-07 ENCOUNTER — Inpatient Hospital Stay (HOSPITAL_COMMUNITY): Payer: Medicare PPO | Admitting: Hematology

## 2022-08-04 ENCOUNTER — Inpatient Hospital Stay: Payer: Medicare PPO

## 2022-08-04 ENCOUNTER — Encounter (HOSPITAL_COMMUNITY): Payer: Self-pay | Admitting: Radiology

## 2022-08-04 ENCOUNTER — Ambulatory Visit (HOSPITAL_COMMUNITY)
Admission: RE | Admit: 2022-08-04 | Discharge: 2022-08-04 | Disposition: A | Discharge status: Home / Self Care | Payer: Medicare PPO | Source: Ambulatory Visit | Attending: Hematology | Admitting: Hematology

## 2022-08-04 DIAGNOSIS — D4989 Neoplasm of unspecified behavior of other specified sites: Secondary | ICD-10-CM | POA: Diagnosis not present

## 2022-08-04 DIAGNOSIS — E278 Other specified disorders of adrenal gland: Secondary | ICD-10-CM | POA: Insufficient documentation

## 2022-08-04 DIAGNOSIS — J432 Centrilobular emphysema: Secondary | ICD-10-CM | POA: Insufficient documentation

## 2022-08-04 DIAGNOSIS — R911 Solitary pulmonary nodule: Secondary | ICD-10-CM | POA: Diagnosis not present

## 2022-08-04 DIAGNOSIS — C3490 Malignant neoplasm of unspecified part of unspecified bronchus or lung: Secondary | ICD-10-CM | POA: Diagnosis not present

## 2022-08-04 DIAGNOSIS — J439 Emphysema, unspecified: Secondary | ICD-10-CM | POA: Diagnosis not present

## 2022-08-04 LAB — POCT I-STAT CREATININE: Creatinine, Ser: 1.5 mg/dL — ABNORMAL HIGH (ref 0.61–1.24)

## 2022-08-04 MED ORDER — IOHEXOL 300 MG/ML  SOLN
100.0000 mL | Freq: Once | INTRAMUSCULAR | Status: AC | PRN
  Administered 2022-08-04: 75 mL via INTRAVENOUS

## 2022-08-11 ENCOUNTER — Inpatient Hospital Stay: Payer: Medicare PPO | Attending: Hematology | Admitting: Hematology

## 2022-08-11 DIAGNOSIS — Z85238 Personal history of other malignant neoplasm of thymus: Secondary | ICD-10-CM | POA: Diagnosis not present

## 2022-08-11 DIAGNOSIS — I129 Hypertensive chronic kidney disease with stage 1 through stage 4 chronic kidney disease, or unspecified chronic kidney disease: Secondary | ICD-10-CM | POA: Diagnosis not present

## 2022-08-11 DIAGNOSIS — Z79899 Other long term (current) drug therapy: Secondary | ICD-10-CM | POA: Insufficient documentation

## 2022-08-11 DIAGNOSIS — F17210 Nicotine dependence, cigarettes, uncomplicated: Secondary | ICD-10-CM | POA: Insufficient documentation

## 2022-08-11 DIAGNOSIS — R768 Other specified abnormal immunological findings in serum: Secondary | ICD-10-CM

## 2022-08-11 DIAGNOSIS — N189 Chronic kidney disease, unspecified: Secondary | ICD-10-CM | POA: Insufficient documentation

## 2022-08-11 NOTE — Patient Instructions (Addendum)
[place] at [facility] Discharge Instructions You were seen and examined today by Dr. Delton Coombes.  Dr. Delton Coombes discussed your most recent lab work and CT scan which revealed that everything looks stable. Be sure to drink 1-2 liters of water daily to help with kidney functions.  We will continue to follow you with labs and CT scans.  Follow-up as scheduled in 6 months.    Thank you for choosing [place] at [facility] to provide your oncology and hematology care.  To afford each patient quality time with our provider, please arrive at least 15 minutes before your scheduled appointment time.   If you have a lab appointment with the [facility] please come in thru the Main Entrance and check in at the main information desk.  You need to re-schedule your appointment should you arrive 10 or more minutes late.  We strive to give you quality time with our providers, and arriving late affects you and other patients whose appointments are after yours.  Also, if you no show three or more times for appointments you may be dismissed from the clinic at the providers discretion.     Again, thank you for choosing [facility].  Our hope is that these requests will decrease the amount of time that you wait before being seen by our physicians.       _____________________________________________________________  Should you have questions after your visit to [facility], please contact our office at [phone] and follow the prompts.  Our office hours are 8:00 a.m. and 4:30 p.m. Monday - Friday.  Please note that voicemails left after 4:00 p.m. may not be returned until the following business day.  We are closed weekends and major holidays.  You do have access to a nurse 24-7, just call the main number to the clinic [phone] and do not press any options, hold on the line and a nurse will answer the phone.    For prescription  refill requests, have your pharmacy contact our office and allow 72 hours.

## 2022-08-11 NOTE — Progress Notes (Signed)
[place], [place] [address]   CLINIC:  Medical Oncology/Hematology  PCP:  Carrolyn Meiers, MD [place] / [place] [address] [phone]   REASON FOR VISIT:  Follow-up for thymic carcinoma and elevated free light chains.  PRIOR THERAPY: none  CURRENT THERAPY: surveillance  BRIEF ONCOLOGIC HISTORY:  Oncology History   No history exists.    CANCER STAGING:  Cancer Staging  No matching staging information was found for the patient.  INTERVAL HISTORY:  [name], a 66 y.o. male, is seen today for follow-up of elevated free light chain ratio and thymic carcinoma.  Denies any new onset pains.  Denies any B symptoms.  No recurrent infections.  Baseline shortness of breath on exertion is stable.  REVIEW OF SYSTEMS:  Review of Systems  Constitutional:  Negative for appetite change, fatigue and unexpected weight change.  Respiratory:  Positive for shortness of breath.   All other systems reviewed and are negative.   PAST MEDICAL/SURGICAL HISTORY:  Past Medical History:  Diagnosis Date   Arthritis    Cancer ([facility])    Chronic kidney disease    Hypertension    Stroke ([facility])    mild left sided weakness   Vocal cord polyp    Past Surgical History:  Procedure Laterality Date   BACK SURGERY     MICROLARYNGOSCOPY N/A 08/29/2018   Procedure: DIRECT MICROLARYNGOSCOPY WITH EXCISION OF LARYNGEAL MASS;  Surgeon: Leta Baptist, MD;  Location: [facility];  Service: ENT;  Laterality: N/A;   STERNOTOMY     THYMECTOMY      SOCIAL HISTORY:  Social History   Socioeconomic History   Marital status: Married    Spouse name: Not on file   Number of children: Not on file   Years of education: Not on file   Highest education level: Not on file  Occupational History   Not on file  Tobacco Use   Smoking status: Every Day    Packs/day: 0.50    Years: 30.00    Total pack years: 15.00    Types: Cigarettes    Smokeless tobacco: Never  Vaping Use   Vaping Use: Never used  Substance and Sexual Activity   Alcohol use: No   Drug use: No   Sexual activity: Yes  Other Topics Concern   Not on file  Social History Narrative   Been on disability since age 6 due to a stroke.    Used to work at [employer].    Married. No children. Married for 15 years.   Smoke cigarettes, since age 44 years.    No alcohol. No drugs.   Enjoys fishing.    Attends church.   Eats all foods.   Wear seatbelt.    Drives.    Social Determinants of Health   Financial Resource Strain: Not on file  Food Insecurity: Not on file  Transportation Needs: Not on file  Physical Activity: Not on file  Stress: Not on file  Social Connections: Not on file  Intimate Partner Violence: Not on file    FAMILY HISTORY:  Family History  Problem Relation Age of Onset   Diabetes Mother    Cancer Mother    Colon cancer Sister    Pneumonia Father    Emphysema Brother     CURRENT MEDICATIONS:  Current Outpatient Medications  Medication Sig Dispense Refill   amLODipine (NORVASC) 10 MG tablet Take 1 tablet (10 mg total)  by mouth daily. 90 tablet 0   aspirin EC 81 MG tablet Take 1 tablet (81 mg total) by mouth daily.     Aspirin-Salicylamide-Caffeine (ARTHRITIS STRENGTH BC POWDER PO) Take by mouth.     atorvastatin (LIPITOR) 20 MG tablet Take 1 tablet (20 mg total) by mouth daily. 90 tablet 3   chlorthalidone (HYGROTON) 25 MG tablet Take by mouth.     Cholecalciferol 50 MCG (2000 UT) CAPS Take by mouth.     IBU 800 MG tablet Take 800 mg by mouth 2 (two) times daily.     losartan (COZAAR) 100 MG tablet Take 1 tablet (100 mg total) by mouth daily. 90 tablet 3   sertraline (ZOLOFT) 100 MG tablet Take 100 mg by mouth daily.     No current facility-administered medications for this visit.    ALLERGIES:  No Known Allergies  PHYSICAL EXAM:  Performance status (ECOG): 1 - Symptomatic but completely ambulatory  There were no  vitals filed for this visit.  Wt Readings from Last 3 Encounters:  06/04/22 257 lb 9.6 oz (116.8 kg)  12/17/21 260 lb 2.3 oz (118 kg)  11/20/20 280 lb (127 kg)   Physical Exam Vitals reviewed.  Constitutional:      Appearance: Normal appearance.  Cardiovascular:     Rate and Rhythm: Normal rate and regular rhythm.     Pulses: Normal pulses.     Heart sounds: Normal heart sounds.  Pulmonary:     Effort: Pulmonary effort is normal.     Breath sounds: Normal breath sounds.  Neurological:     General: No focal deficit present.     Mental Status: He is alert and oriented to person, place, and time.  Psychiatric:        Mood and Affect: Mood normal.        Behavior: Behavior normal.      LABORATORY DATA:  I have reviewed the labs as listed.     Latest Ref Rng & Units 06/04/2022   12:36 PM 12/10/2021   10:18 AM 02/16/2021   12:15 PM  CBC  WBC 4.0 - 10.5 K/uL 5.9  5.3  5.7   Hemoglobin 13.0 - 17.0 g/dL 15.9  15.4  15.0   Hematocrit 39.0 - 52.0 % 48.7  47.1  47.9   Platelets 150 - 400 K/uL 328  332  341       Latest Ref Rng & Units 08/04/2022    1:27 PM 06/04/2022   12:36 PM 12/10/2021   10:18 AM  CMP  Glucose 70 - 99 mg/dL  116  114   BUN 8 - 23 mg/dL  11  14   Creatinine 0.61 - 1.24 mg/dL 1.50  1.39  1.28   Sodium 135 - 145 mmol/L  137  136   Potassium 3.5 - 5.1 mmol/L  3.9  4.0   Chloride 98 - 111 mmol/L  105  103   CO2 22 - 32 mmol/L  28  26   Calcium 8.9 - 10.3 mg/dL  9.0  8.9   Total Protein 6.5 - 8.1 g/dL  7.6  7.4   Total Bilirubin 0.3 - 1.2 mg/dL  0.5  0.4   Alkaline Phos 38 - 126 U/L  76  63   AST 15 - 41 U/L  20  16   ALT 0 - 44 U/L  14  14     DIAGNOSTIC IMAGING:  I have independently reviewed the scans and discussed with the patient. CT  Chest W Contrast  Result Date: 08/05/2022 CLINICAL DATA:  Non-small cell lung cancer, monitor. Follow-up thymic mass. * Tracking Code: BO * EXAM: CT CHEST WITH CONTRAST TECHNIQUE: Multidetector CT imaging of the chest was  performed during intravenous contrast administration. RADIATION DOSE REDUCTION: This exam was performed according to the departmental dose-optimization program which includes automated exposure control, adjustment of the mA and/or kV according to patient size and/or use of iterative reconstruction technique. CONTRAST:  62m OMNIPAQUE IOHEXOL 300 MG/ML  SOLN COMPARISON:  Chest CT 02/16/2021 and 12/04/2019. FINDINGS: Cardiovascular: No acute vascular findings. Minimal coronary artery atherosclerosis. The thoracic aorta appears normal. The heart size is normal. There is no pericardial effusion. Mediastinum/Nodes: There are no enlarged mediastinal, hilar or axillary lymph nodes.No evidence of thymic or other anterior mediastinal mass. The thyroid gland, trachea and esophagus demonstrate no significant findings. Lungs/Pleura: No pleural effusion or pneumothorax. Mild centrilobular emphysema with mild central airway thickening and scattered subpleural reticulation. Continued slow growth of pleural base soft tissue nodule at the left apex, currently measuring 1.6 x 1.0 cm on image 22/2. This most recently measured 1.2 x 0.8 cm when measured in a similar fashion. No adjacent rib erosion/destruction. No other suspicious pleural or pulmonary nodularity. Upper abdomen: Stable nodular enlargement of both adrenal glands dating back to at least 2019, consistent with adrenal hyperplasia. No suspicious findings in the visualized upper abdomen. Musculoskeletal/Chest wall: There is no chest wall mass or suspicious osseous finding. Healed median sternotomy. Mild spondylosis. IMPRESSION: 1. No significant residual findings in the anterior mediastinum status post remote median sternotomy. No evidence of thymic mass. 2. Continued slow growth of pleural base nodule at the left lung apex, without aggressive characteristics. This could reflect a neurogenic lesion, solitary fibrous tumor of the thorax or noncalcified pleural plaque. Consider  further evaluation with MRI (MRI of the thoracic spine without and with contrast is likely the best protocol to evaluate this area) versus continued annual CT follow-up. 3.  Emphysema (ICD10-J43.9). Electronically Signed   By: WRichardean SaleM.D.   On: 08/05/2022 12:03     ASSESSMENT:  1.  Poorly differentiated thymic carcinoma: -Presentation with weight loss of 40 pounds, PET/CT scan on 10/05/2016 showed anterior mediastinal mass measuring 3.6 x 1.9 cm with SUV of 6.3, status post resection on 12/29/2016 -Pathology (12/29/2016): Showed poorly differentiated thymic carcinoma, with foci of squamous differentiation, negative margin for lung tissue, negative margin for pericardial and pleura, one lymph node negative, pT2pN0M0, adjuvant chemoradiation therapy was recommended, patient declined.   -[facility] CT scan of the chest with contrast on 12/14/2017 shows no evidence of recurrence or metastatic disease.  Scattered small pulmonary nodules are unchanged from 09/22/2016.   -Family history with one sister died of colon cancer in her 568s another sister with metastatic cancer at this time    2.  Tobacco abuse: Patient smoked 1 pack/day for the past 44 years.  He reports that he cut back to half pack per day since his surgery.   3. Abnormal free light chains:  - Patient seen at the request of Dr. BTheador Hawthornefor abnormal free light chains. - Labs on 04/07/2021: SPEP-poorly defined band of restricted protein mobility in the gamma region.  SIFX-negative.  FLC ratio 4.22, kappa light chains 137, lambda light chain 32.5.  Creatinine was 1.41.  Hemoglobin was normal. -No neuropathy in the extremities.   PLAN:  1.  Poorly differentiated thymic carcinoma: -CT chest with contrast (08/04/2022): No significant residual  findings in the anterior mediastinum with no evidence of thymic mass.  Continued slow growth of pleural-based nodule in the left lung apex without aggressive  characteristics. - Based on these findings, I have recommended CT chest in 1 year.  2.  Abnormal free light chain ratio: - We reviewed labs from 06/04/2022.  Free light chain ratio is elevated at 4.76 with kappa light chains 152.  Both are stable compared to labs from April 2022.  CBC shows normal hemoglobin.  Creatinine is 1.39 and at baseline.  Calcium was normal.  LDH was normal. - 24-hour urine showed total protein of 803 mg.  Elevated free light chain ratio.  No evidence of M spike.  Immunofixation was unremarkable. - Recommend follow-up with repeat labs in 6 months.   Orders placed this encounter:  No orders of the defined types were placed in this encounter.    Derek Jack, MD [place] [phone]

## 2023-02-10 ENCOUNTER — Inpatient Hospital Stay: Payer: Medicare Other

## 2023-02-14 ENCOUNTER — Inpatient Hospital Stay: Payer: Medicare Other | Attending: Hematology

## 2023-02-14 DIAGNOSIS — Z79899 Other long term (current) drug therapy: Secondary | ICD-10-CM | POA: Diagnosis not present

## 2023-02-14 DIAGNOSIS — R768 Other specified abnormal immunological findings in serum: Secondary | ICD-10-CM

## 2023-02-14 DIAGNOSIS — F17210 Nicotine dependence, cigarettes, uncomplicated: Secondary | ICD-10-CM | POA: Insufficient documentation

## 2023-02-14 DIAGNOSIS — Z85238 Personal history of other malignant neoplasm of thymus: Secondary | ICD-10-CM | POA: Diagnosis not present

## 2023-02-14 LAB — CBC WITH DIFFERENTIAL/PLATELET
Abs Immature Granulocytes: 0.02 10*3/uL (ref 0.00–0.07)
Basophils Absolute: 0.1 10*3/uL (ref 0.0–0.1)
Basophils Relative: 1 %
Eosinophils Absolute: 0.1 10*3/uL (ref 0.0–0.5)
Eosinophils Relative: 1 %
HCT: 48.4 % (ref 39.0–52.0)
Hemoglobin: 15.8 g/dL (ref 13.0–17.0)
Immature Granulocytes: 0 %
Lymphocytes Relative: 41 %
Lymphs Abs: 1.8 10*3/uL (ref 0.7–4.0)
MCH: 32.5 pg (ref 26.0–34.0)
MCHC: 32.6 g/dL (ref 30.0–36.0)
MCV: 99.6 fL (ref 80.0–100.0)
Monocytes Absolute: 0.4 10*3/uL (ref 0.1–1.0)
Monocytes Relative: 9 %
Neutro Abs: 2.1 10*3/uL (ref 1.7–7.7)
Neutrophils Relative %: 48 %
Platelets: 305 10*3/uL (ref 150–400)
RBC: 4.86 MIL/uL (ref 4.22–5.81)
RDW: 12.6 % (ref 11.5–15.5)
WBC: 4.5 10*3/uL (ref 4.0–10.5)
nRBC: 0 % (ref 0.0–0.2)

## 2023-02-14 LAB — LACTATE DEHYDROGENASE: LDH: 121 U/L (ref 98–192)

## 2023-02-14 LAB — COMPREHENSIVE METABOLIC PANEL
ALT: 12 U/L (ref 0–44)
AST: 20 U/L (ref 15–41)
Albumin: 3.7 g/dL (ref 3.5–5.0)
Alkaline Phosphatase: 64 U/L (ref 38–126)
Anion gap: 7 (ref 5–15)
BUN: 14 mg/dL (ref 8–23)
CO2: 26 mmol/L (ref 22–32)
Calcium: 8.7 mg/dL — ABNORMAL LOW (ref 8.9–10.3)
Chloride: 104 mmol/L (ref 98–111)
Creatinine, Ser: 1.44 mg/dL — ABNORMAL HIGH (ref 0.61–1.24)
GFR, Estimated: 54 mL/min — ABNORMAL LOW (ref >60)
Glucose, Bld: 122 mg/dL — ABNORMAL HIGH (ref 70–99)
Potassium: 3.7 mmol/L (ref 3.5–5.1)
Sodium: 137 mmol/L (ref 135–145)
Total Bilirubin: 0.7 mg/dL (ref 0.3–1.2)
Total Protein: 7.8 g/dL (ref 6.5–8.1)

## 2023-02-15 LAB — KAPPA/LAMBDA LIGHT CHAINS
Kappa free light chain: 154.6 mg/L — ABNORMAL HIGH (ref 3.3–19.4)
Kappa, lambda light chain ratio: 5.17 — ABNORMAL HIGH (ref 0.26–1.65)
Lambda free light chains: 29.9 mg/L — ABNORMAL HIGH (ref 5.7–26.3)

## 2023-02-16 LAB — PROTEIN ELECTROPHORESIS, SERUM
A/G Ratio: 1 (ref 0.7–1.7)
Albumin ELP: 3.5 g/dL (ref 2.9–4.4)
Alpha-1-Globulin: 0.2 g/dL (ref 0.0–0.4)
Alpha-2-Globulin: 0.7 g/dL (ref 0.4–1.0)
Beta Globulin: 1 g/dL (ref 0.7–1.3)
Gamma Globulin: 1.7 g/dL (ref 0.4–1.8)
Globulin, Total: 3.6 g/dL (ref 2.2–3.9)
Total Protein ELP: 7.1 g/dL (ref 6.0–8.5)

## 2023-02-17 ENCOUNTER — Inpatient Hospital Stay: Payer: Medicare Other | Admitting: Physician Assistant

## 2023-02-17 ENCOUNTER — Ambulatory Visit: Payer: Medicare PPO | Admitting: Physician Assistant

## 2023-02-17 NOTE — Progress Notes (Deleted)
NO SHOW

## 2023-02-18 LAB — IMMUNOFIXATION ELECTROPHORESIS
IgA: 287 mg/dL (ref 61–437)
IgG (Immunoglobin G), Serum: 1734 mg/dL — ABNORMAL HIGH (ref 603–1613)
IgM (Immunoglobulin M), Srm: 52 mg/dL (ref 20–172)
Total Protein ELP: 7.3 g/dL (ref 6.0–8.5)

## 2023-02-23 ENCOUNTER — Ambulatory Visit: Payer: Medicare Other | Admitting: Physician Assistant

## 2023-02-28 NOTE — Progress Notes (Unsigned)
[place], [place] [address]   CLINIC:  Medical Oncology/Hematology  PCP:  Carrolyn Meiers, MD [place] [id number] [phone]   REASON FOR VISIT:  Follow-up for elevated free light chains and thymic carcinoma s/p surgical resection  CURRENT THERAPY: Surveillance  INTERVAL HISTORY:   Derrick Gaines 67 y.o. male returns for routine follow-up of elevated free light chains and history of thymic carcinoma. *** He denies any new onset bone pains. *** He denies any B symptoms. *** He denies any recurrent infections. *** He denies any neurologic changes, numbness/tingling in hands or feet, headaches, or vision changes. *** No new masses or lymphadenopathy *** He denies any new cough, shortness of breath, chest pain, hoarseness, or upper body swelling *** *** Baseline shortness of breath on exertion is stable. He has ***% energy and ***% appetite. He endorses that he is maintaining a stable weight.  ASSESSMENT & PLAN:  1.  Abnormal free light chains - Seen at the request of Dr. Theador Hawthorne for abnormal free light chains noted in 2022 - Labs on 04/07/2021: SPEP-poorly defined band of restricted protein mobility in the gamma region.  SIFX-negative.  FLC ratio 4.22, kappa light chains 137, lambda light chain 32.5.  Creatinine was 1.41.  Hemoglobin was normal. - 24-hour urine (***) showed total protein 803 mg, with elevated free light chain ratio, no evidence of M spike, and unremarkable immunofixation - Most recent MGUS/myeloma panel (02/14/2023): SPEP unremarkable Serum immunofixation showed POLYCLONAL increase in immunoglobulins Elevated kappa free light chain 154.6, lambda 29.9, elevated ratio 5.17. No apparent CRAB features, with normal Hgb 15.8, baseline creatinine 1.44 (CKD stage IIIb), calcium 8.7.  Normal LDH. - No B symptoms, new onset bone pain, or neuropathy *** - PLAN: Overall stable.  No concern for myeloma at  this time.  Repeat MGUS/myeloma labs at follow-up in 6 months ***  2.  Poorly differentiated thymic carcinoma - Presentation in 2017 with weight loss of 40 pounds - PET/CT scan (10/05/2016): Anterior mediastinal mass measuring 3.6 x 1.9 cm with SUV of 6.3 - Mediastinal mass resected on 12/29/2016, with pathology showing poorly differentiated thymic carcinoma with foci of squamous differentiation, negative margin for lung tissue, negative margin for pericardial and pleura, 1 lymph node negative, pT2 pN0 M0. - Adjuvant chemoradiation therapy was recommended, patient declined -CT chest with contrast (08/04/2022): No significant residual findings in the anterior mediastinum with no evidence of thymic mass.  Continued slow growth of pleural-based nodule in the left lung apex without aggressive characteristics.  - No B symptoms, chest pain, or new cough *** - PLAN: Due for CT chest and MD visit in August 2024.  ***  3.  Tobacco use - Patient has smoked 1 PPD since age 62, has cut back to 0.5 PPD since 2018  - PLAN: No need for LDCT chest at this time, since patient is receiving annual CT chest for follow-up of his previous thymic carcinoma.  4.  Other history - Prior to retirement, he worked at a [employer].  Denies any chemical exposure. - One sister had colon cancer, another sister had breast cancer   PLAN SUMMARY: >> *** >> *** >> ***   [facility] at [facility] **   You were seen today by Tarri Abernethy PA-C for your ***.    *** ***  *** ***  LABS: Return in ***   OTHER TESTS: ***  MEDICATIONS: ***  FOLLOW-UP APPOINTMENT: ***     REVIEW OF SYSTEMS: ***  Review of Systems - Oncology   PHYSICAL EXAM:  ECOG PERFORMANCE STATUS: {CHL ONC ECOG [id number] *** There were no vitals filed for this visit. There were no vitals filed for this visit. Physical Exam  PAST MEDICAL/SURGICAL HISTORY:  Past Medical  History:  Diagnosis Date   Arthritis    Cancer ([facility])    Chronic kidney disease    Hypertension    Stroke ([facility])    mild left sided weakness   Vocal cord polyp    Past Surgical History:  Procedure Laterality Date   BACK SURGERY     MICROLARYNGOSCOPY N/A 08/29/2018   Procedure: DIRECT MICROLARYNGOSCOPY WITH EXCISION OF LARYNGEAL MASS;  Surgeon: Leta Baptist, MD;  Location: [facility];  Service: ENT;  Laterality: N/A;   STERNOTOMY     THYMECTOMY      SOCIAL HISTORY:  Social History   Socioeconomic History   Marital status: Married    Spouse name: Not on file   Number of children: Not on file   Years of education: Not on file   Highest education level: Not on file  Occupational History   Not on file  Tobacco Use   Smoking status: Every Day    Packs/day: 0.50    Years: 30.00    Total pack years: 15.00    Types: Cigarettes   Smokeless tobacco: Never  Vaping Use   Vaping Use: Never used  Substance and Sexual Activity   Alcohol use: No   Drug use: No   Sexual activity: Yes  Other Topics Concern   Not on file  Social History Narrative   Been on disability since age 74 due to a stroke.    Used to work at [employer].    Married. No children. Married for 15 years.   Smoke cigarettes, since age 67 years.    No alcohol. No drugs.   Enjoys fishing.    Attends church.   Eats all foods.   Wear seatbelt.    Drives.    Social Determinants of Health   Financial Resource Strain: Not on file  Food Insecurity: Not on file  Transportation Needs: Not on file  Physical Activity: Not on file  Stress: Not on file  Social Connections: Not on file  Intimate Partner Violence: Not on file    FAMILY HISTORY:  Family History  Problem Relation Age of Onset   Diabetes Mother    Cancer Mother    Colon cancer Sister    Pneumonia Father    Emphysema Brother     CURRENT MEDICATIONS:  Outpatient Encounter Medications as of 03/01/2023  Medication Sig Note   amLODipine  (NORVASC) 10 MG tablet Take 1 tablet (10 mg total) by mouth daily. 11/21/2020: LF: 10/29/20 DS:90   aspirin EC 81 MG tablet Take 1 tablet (81 mg total) by mouth daily.    Aspirin-Salicylamide-Caffeine (ARTHRITIS STRENGTH BC POWDER PO) Take by mouth.    atorvastatin (LIPITOR) 20 MG tablet Take 1 tablet (20 mg total) by mouth daily.    chlorthalidone (HYGROTON) 25 MG tablet Take by mouth.    Cholecalciferol 50 MCG (2000 UT) CAPS Take by mouth.    IBU 800 MG tablet Take 800 mg by mouth 2 (two) times daily. 11/21/2020: LF: 08/22/20 DS:90   losartan (COZAAR) 100 MG tablet Take 1 tablet (100 mg total) by mouth daily.    sertraline (ZOLOFT) 100 MG tablet Take 100  mg by mouth daily. 11/21/2020: LF: 10/29/20 DS:90   No facility-administered encounter medications on file as of 03/01/2023.    ALLERGIES:  No Known Allergies  LABORATORY DATA:  I have reviewed the labs as listed.  CBC    Component Value Date/Time   WBC 4.5 02/14/2023 1131   RBC 4.86 02/14/2023 1131   HGB 15.8 02/14/2023 1131   HCT 48.4 02/14/2023 1131   PLT 305 02/14/2023 1131   MCV 99.6 02/14/2023 1131   MCH 32.5 02/14/2023 1131   MCHC 32.6 02/14/2023 1131   RDW 12.6 02/14/2023 1131   LYMPHSABS 1.8 02/14/2023 1131   MONOABS 0.4 02/14/2023 1131   EOSABS 0.1 02/14/2023 1131   BASOSABS 0.1 02/14/2023 1131      Latest Ref Rng & Units 02/14/2023   11:31 AM 08/04/2022    1:27 PM 06/04/2022   12:36 PM  CMP  Glucose 70 - 99 mg/dL 122   116   BUN 8 - 23 mg/dL 14   11   Creatinine 0.61 - 1.24 mg/dL 1.44  1.50  1.39   Sodium 135 - 145 mmol/L 137   137   Potassium 3.5 - 5.1 mmol/L 3.7   3.9   Chloride 98 - 111 mmol/L 104   105   CO2 22 - 32 mmol/L 26   28   Calcium 8.9 - 10.3 mg/dL 8.7   9.0   Total Protein 6.5 - 8.1 g/dL 7.8   7.6   Total Bilirubin 0.3 - 1.2 mg/dL 0.7   0.5   Alkaline Phos 38 - 126 U/L 64   76   AST 15 - 41 U/L 20   20   ALT 0 - 44 U/L 12   14     DIAGNOSTIC IMAGING:  I have independently reviewed the relevant  imaging and discussed with the patient.   WRAP UP:  All questions were answered. The patient knows to call the clinic with any problems, questions or concerns.  Medical decision making: ***  Time spent on visit: I spent *** minutes counseling the patient face to face. The total time spent in the appointment was *** minutes and more than 50% was on counseling.  Harriett Rush, PA-C  ***

## 2023-03-01 ENCOUNTER — Inpatient Hospital Stay: Payer: Medicare Other | Attending: Hematology | Admitting: Physician Assistant

## 2023-03-01 VITALS — BP 175/113 | HR 90 | Temp 97.9°F | Resp 18 | Wt 242.3 lb

## 2023-03-01 DIAGNOSIS — R911 Solitary pulmonary nodule: Secondary | ICD-10-CM | POA: Insufficient documentation

## 2023-03-01 DIAGNOSIS — Z79899 Other long term (current) drug therapy: Secondary | ICD-10-CM | POA: Insufficient documentation

## 2023-03-01 DIAGNOSIS — C9000 Multiple myeloma not having achieved remission: Secondary | ICD-10-CM | POA: Insufficient documentation

## 2023-03-01 DIAGNOSIS — Z85238 Personal history of other malignant neoplasm of thymus: Secondary | ICD-10-CM | POA: Diagnosis present

## 2023-03-01 DIAGNOSIS — Z800 Family history of malignant neoplasm of digestive organs: Secondary | ICD-10-CM | POA: Insufficient documentation

## 2023-03-01 DIAGNOSIS — I129 Hypertensive chronic kidney disease with stage 1 through stage 4 chronic kidney disease, or unspecified chronic kidney disease: Secondary | ICD-10-CM | POA: Insufficient documentation

## 2023-03-01 DIAGNOSIS — R768 Other specified abnormal immunological findings in serum: Secondary | ICD-10-CM

## 2023-03-01 DIAGNOSIS — F17210 Nicotine dependence, cigarettes, uncomplicated: Secondary | ICD-10-CM | POA: Diagnosis not present

## 2023-03-01 DIAGNOSIS — J381 Polyp of vocal cord and larynx: Secondary | ICD-10-CM | POA: Diagnosis not present

## 2023-03-01 DIAGNOSIS — Z8673 Personal history of transient ischemic attack (TIA), and cerebral infarction without residual deficits: Secondary | ICD-10-CM | POA: Insufficient documentation

## 2023-03-01 DIAGNOSIS — D472 Monoclonal gammopathy: Secondary | ICD-10-CM | POA: Insufficient documentation

## 2023-03-01 DIAGNOSIS — D4989 Neoplasm of unspecified behavior of other specified sites: Secondary | ICD-10-CM | POA: Diagnosis not present

## 2023-03-01 DIAGNOSIS — Z7982 Long term (current) use of aspirin: Secondary | ICD-10-CM | POA: Diagnosis not present

## 2023-03-01 DIAGNOSIS — N189 Chronic kidney disease, unspecified: Secondary | ICD-10-CM | POA: Insufficient documentation

## 2023-03-01 DIAGNOSIS — Z803 Family history of malignant neoplasm of breast: Secondary | ICD-10-CM | POA: Diagnosis not present

## 2023-03-01 NOTE — Patient Instructions (Addendum)
[facility] [place] **   You were seen today by Tarri Abernethy PA-C for your follow-up visit.    ABNORMAL IMMUNOGLOBULINS: You were seen today due to elevated immunoglobulin light chain proteins.  This does not appear to be causing any problems at this time, but can be a sign of a precancerous state in some patients.  We will recheck these labs at your follow-up visit in 6 months.  HISTORY OF THYMIC CANCER: You will be due for another CT scan of your chest and visit with Dr. Delton Coombes in 6 months.  HIGH BLOOD PRESSURE: Your blood pressure today was very high.  Make sure that you are taking your blood pressure medication every day as prescribed.  Take this medication as soon as you arrive at home and recheck your blood pressure 30 minutes after taking your medicine.  Call your primary care doctor if your blood pressure remains elevated at >160/90.  LABS: Return in 6 months for repeat labs  OTHER TESTS: CT chest + whole body x-rays in 6 months  MEDICATIONS: No changes to home medications  FOLLOW-UP APPOINTMENT: Office visit in 6 months (1 week after labs and CT scan)  ** Thank you for trusting me with your healthcare!  I strive to provide all of my patients with quality care at each visit.  If you receive a survey for this visit, I would be so grateful to you for taking the time to provide feedback.  Thank you in advance!  ~ Akeylah Hendel                   Dr. Derek Jack   &   Tarri Abernethy, PA-C   - - - - - - - - - - - - - - - - - -    Thank you for choosing [facility] at [facility] to provide your oncology and hematology care.  To afford each patient quality time with our provider, please arrive at least 15 minutes before your scheduled appointment time.   If you have a lab appointment with the [facility] please come in thru the Main Entrance and check in at the main information  desk.  You need to re-schedule your appointment should you arrive 10 or more minutes late.  We strive to give you quality time with our providers, and arriving late affects you and other patients whose appointments are after yours.  Also, if you no show three or more times for appointments you may be dismissed from the clinic at the providers discretion.     Again, thank you for choosing [facility].  Our hope is that these requests will decrease the amount of time that you wait before being seen by our physicians.       _____________________________________________________________  Should you have questions after your visit to [facility], please contact our office at [phone] and follow the prompts.  Our office hours are 8:00 a.m. and 4:30 p.m. Monday - Friday.  Please note that voicemails left after 4:00 p.m. may not be returned until the following business day.  We are closed weekends and major holidays.  You do have access to a nurse 24-7, just call the main number to the clinic [phone] and do not press any options, hold on the line and a nurse will answer the phone.    For prescription refill requests, have your pharmacy contact  our office and allow 72 hours.

## 2023-04-15 ENCOUNTER — Other Ambulatory Visit (HOSPITAL_COMMUNITY): Payer: Self-pay | Admitting: Gerontology

## 2023-04-15 ENCOUNTER — Ambulatory Visit (HOSPITAL_COMMUNITY)
Admission: RE | Admit: 2023-04-15 | Discharge: 2023-04-15 | Disposition: A | Discharge status: Home / Self Care | Payer: Medicare HMO | Source: Ambulatory Visit | Attending: Gerontology | Admitting: Gerontology

## 2023-04-15 DIAGNOSIS — R059 Cough, unspecified: Secondary | ICD-10-CM | POA: Diagnosis present

## 2023-04-25 ENCOUNTER — Emergency Department (HOSPITAL_COMMUNITY)
Admission: EM | Admit: 2023-04-25 | Discharge: 2023-04-25 | Disposition: A | Discharge status: Home / Self Care | Payer: Medicare HMO | Attending: Emergency Medicine | Admitting: Emergency Medicine

## 2023-04-25 ENCOUNTER — Other Ambulatory Visit: Payer: Self-pay

## 2023-04-25 ENCOUNTER — Encounter (HOSPITAL_COMMUNITY): Payer: Self-pay | Admitting: *Deleted

## 2023-04-25 DIAGNOSIS — I10 Essential (primary) hypertension: Secondary | ICD-10-CM

## 2023-04-25 DIAGNOSIS — Z7982 Long term (current) use of aspirin: Secondary | ICD-10-CM | POA: Diagnosis not present

## 2023-04-25 DIAGNOSIS — I129 Hypertensive chronic kidney disease with stage 1 through stage 4 chronic kidney disease, or unspecified chronic kidney disease: Secondary | ICD-10-CM | POA: Diagnosis present

## 2023-04-25 DIAGNOSIS — N189 Chronic kidney disease, unspecified: Secondary | ICD-10-CM | POA: Insufficient documentation

## 2023-04-25 DIAGNOSIS — Z79899 Other long term (current) drug therapy: Secondary | ICD-10-CM | POA: Diagnosis not present

## 2023-04-25 LAB — BASIC METABOLIC PANEL
Anion gap: 9 (ref 5–15)
BUN: 16 mg/dL (ref 8–23)
CO2: 26 mmol/L (ref 22–32)
Calcium: 9 mg/dL (ref 8.9–10.3)
Chloride: 99 mmol/L (ref 98–111)
Creatinine, Ser: 1.47 mg/dL — ABNORMAL HIGH (ref 0.61–1.24)
GFR, Estimated: 53 mL/min — ABNORMAL LOW (ref >60)
Glucose, Bld: 111 mg/dL — ABNORMAL HIGH (ref 70–99)
Potassium: 3.4 mmol/L — ABNORMAL LOW (ref 3.5–5.1)
Sodium: 134 mmol/L — ABNORMAL LOW (ref 135–145)

## 2023-04-25 LAB — CBC WITH DIFFERENTIAL/PLATELET
Abs Immature Granulocytes: 0.08 10*3/uL — ABNORMAL HIGH (ref 0.00–0.07)
Basophils Absolute: 0.1 10*3/uL (ref 0.0–0.1)
Basophils Relative: 1 %
Eosinophils Absolute: 0 10*3/uL (ref 0.0–0.5)
Eosinophils Relative: 1 %
HCT: 46.2 % (ref 39.0–52.0)
Hemoglobin: 15.4 g/dL (ref 13.0–17.0)
Immature Granulocytes: 1 %
Lymphocytes Relative: 32 %
Lymphs Abs: 2.1 10*3/uL (ref 0.7–4.0)
MCH: 32.6 pg (ref 26.0–34.0)
MCHC: 33.3 g/dL (ref 30.0–36.0)
MCV: 97.7 fL (ref 80.0–100.0)
Monocytes Absolute: 0.7 10*3/uL (ref 0.1–1.0)
Monocytes Relative: 11 %
Neutro Abs: 3.5 10*3/uL (ref 1.7–7.7)
Neutrophils Relative %: 54 %
Platelets: 398 10*3/uL (ref 150–400)
RBC: 4.73 MIL/uL (ref 4.22–5.81)
RDW: 12.5 % (ref 11.5–15.5)
WBC: 6.4 10*3/uL (ref 4.0–10.5)
nRBC: 0 % (ref 0.0–0.2)

## 2023-04-25 NOTE — Discharge Instructions (Signed)
You were evaluated today for high blood pressure.  Please resume taking your home blood pressure medications.  Please follow-up with your primary care provider as needed.  If you develop any life-threatening symptoms please return to the emergency department.

## 2023-04-25 NOTE — ED Triage Notes (Signed)
Pt with HTN and has not been taking his HTN medicine for past week.  Pt denies any pain.

## 2023-04-25 NOTE — ED Provider Notes (Signed)
[place] EMERGENCY DEPARTMENT AT [facility] Provider Note   CSN: [id number] Arrival date & time: 04/25/23  1542     History  Chief Complaint  Patient presents with   Hypertension    Derrick Gaines is a 67 y.o. male.  Patient presents to the emergency room with complaint of hypertension.  He denies headaches, urinary symptoms, abdominal pain, chest pain, shortness of breath.  He denies any symptoms.  He states he has not been taking his blood pressure medication because he "has not felt like it".  Past medical history significant for arthritis, chronic kidney disease, stroke, hypertension  HPI     Home Medications Prior to Admission medications   Medication Sig Start Date End Date Taking? Authorizing Provider  amLODipine (NORVASC) 10 MG tablet Take 1 tablet (10 mg total) by mouth daily. 08/16/18   Aliene Beams, MD  aspirin EC 81 MG tablet Take 1 tablet (81 mg total) by mouth daily. 03/08/18   Aliene Beams, MD  Aspirin-Salicylamide-Caffeine (ARTHRITIS STRENGTH BC POWDER PO) Take by mouth.    [provider]  atorvastatin (LIPITOR) 20 MG tablet Take 1 tablet (20 mg total) by mouth daily. 04/11/18   Aliene Beams, MD  chlorthalidone (HYGROTON) 25 MG tablet Take by mouth. 03/18/22 03/18/23  [provider]  IBU 800 MG tablet Take 800 mg by mouth 2 (two) times daily. 08/22/20   [provider]  losartan (COZAAR) 100 MG tablet Take 1 tablet (100 mg total) by mouth daily. 05/31/18   Aliene Beams, MD  sertraline (ZOLOFT) 100 MG tablet Take 100 mg by mouth daily. 10/29/20   [provider]      Allergies    Patient has no known allergies.    Review of Systems   Review of Systems  Physical Exam Updated Vital Signs BP (!) 172/108 (BP Location: Right Arm)   Pulse (!) 105   Temp 98.4 F (36.9 C) (Oral)   Resp 18   Ht 6\' 1"  (1.854 m)   Wt 124.7 kg   SpO2 97%   BMI 36.28 kg/m  Physical Exam HENT:     Head: Normocephalic and  atraumatic.  Eyes:     Conjunctiva/sclera: Conjunctivae normal.  Cardiovascular:     Rate and Rhythm: Normal rate and regular rhythm.  Pulmonary:     Effort: Pulmonary effort is normal. No respiratory distress.     Breath sounds: Normal breath sounds.  Musculoskeletal:        General: No signs of injury.     Cervical back: Normal range of motion.  Skin:    General: Skin is dry.  Neurological:     Mental Status: He is alert and oriented to person, place, and time.  Psychiatric:        Speech: Speech normal.        Behavior: Behavior normal.     ED Results / Procedures / Treatments   Labs (all labs ordered are listed, but only abnormal results are displayed) Labs Reviewed  CBC WITH DIFFERENTIAL/PLATELET - Abnormal; Notable for the following components:      Result Value   Abs Immature Granulocytes 0.08 (*)    All other components within normal limits  BASIC METABOLIC PANEL - Abnormal; Notable for the following components:   Sodium 134 (*)    Potassium 3.4 (*)    Glucose, Bld 111 (*)    Creatinine, Ser 1.47 (*)    GFR, Estimated 53 (*)    All other components  within normal limits    EKG None  Radiology No results found.  Procedures Procedures    Medications Ordered in ED Medications - No data to display  ED Course/ Medical Decision Making/ A&P                             Medical Decision Making Amount and/or Complexity of Data Reviewed Labs: ordered.   Patient is alert and oriented with no complaints other than hypertension.  I discussed hypertension medication with the patient he states he has prescriptions at home that he has not been taking.  I explained the importance of taking the patient's medications to help prevent complications due to his hypertension and the patient voices understanding.  The patient states he will go home and take his medications.  There is no indication at this time for workup here in the emergency department.  Patient has no  complaints to suggest endorgan damage due to his hypertension.  Discharge home with resumption of patient's hypertension medications.        Final Clinical Impression(s) / ED Diagnoses Final diagnoses:  Hypertension, unspecified type    Rx / DC Orders ED Discharge Orders     None         Pamala Duffel 04/25/23 Merri Brunette, MD 04/29/23 2215

## 2023-04-27 ENCOUNTER — Encounter (HOSPITAL_COMMUNITY): Payer: Self-pay

## 2023-04-27 ENCOUNTER — Observation Stay (HOSPITAL_COMMUNITY): Payer: Medicare PPO

## 2023-04-27 ENCOUNTER — Observation Stay (HOSPITAL_COMMUNITY)
Admission: EM | Admit: 2023-04-27 | Discharge: 2023-04-28 | Disposition: A | Discharge status: Home / Self Care | Payer: Medicare PPO | Attending: Family Medicine | Admitting: Family Medicine

## 2023-04-27 ENCOUNTER — Emergency Department (HOSPITAL_COMMUNITY): Payer: Medicare PPO

## 2023-04-27 ENCOUNTER — Other Ambulatory Visit: Payer: Self-pay

## 2023-04-27 DIAGNOSIS — R778 Other specified abnormalities of plasma proteins: Secondary | ICD-10-CM | POA: Diagnosis not present

## 2023-04-27 DIAGNOSIS — Z91148 Patient's other noncompliance with medication regimen for other reason: Secondary | ICD-10-CM

## 2023-04-27 DIAGNOSIS — Z85828 Personal history of other malignant neoplasm of skin: Secondary | ICD-10-CM | POA: Insufficient documentation

## 2023-04-27 DIAGNOSIS — E7800 Pure hypercholesterolemia, unspecified: Secondary | ICD-10-CM | POA: Diagnosis present

## 2023-04-27 DIAGNOSIS — I10 Essential (primary) hypertension: Secondary | ICD-10-CM | POA: Diagnosis present

## 2023-04-27 DIAGNOSIS — Z79899 Other long term (current) drug therapy: Secondary | ICD-10-CM | POA: Insufficient documentation

## 2023-04-27 DIAGNOSIS — I214 Non-ST elevation (NSTEMI) myocardial infarction: Secondary | ICD-10-CM | POA: Insufficient documentation

## 2023-04-27 DIAGNOSIS — Z8673 Personal history of transient ischemic attack (TIA), and cerebral infarction without residual deficits: Secondary | ICD-10-CM | POA: Insufficient documentation

## 2023-04-27 DIAGNOSIS — N183 Chronic kidney disease, stage 3 unspecified: Secondary | ICD-10-CM | POA: Diagnosis present

## 2023-04-27 DIAGNOSIS — E876 Hypokalemia: Secondary | ICD-10-CM | POA: Diagnosis not present

## 2023-04-27 DIAGNOSIS — R519 Headache, unspecified: Secondary | ICD-10-CM | POA: Diagnosis not present

## 2023-04-27 DIAGNOSIS — Z7982 Long term (current) use of aspirin: Secondary | ICD-10-CM | POA: Insufficient documentation

## 2023-04-27 DIAGNOSIS — R462 Strange and inexplicable behavior: Secondary | ICD-10-CM | POA: Insufficient documentation

## 2023-04-27 DIAGNOSIS — R4689 Other symptoms and signs involving appearance and behavior: Secondary | ICD-10-CM | POA: Diagnosis not present

## 2023-04-27 DIAGNOSIS — G9389 Other specified disorders of brain: Secondary | ICD-10-CM | POA: Diagnosis not present

## 2023-04-27 DIAGNOSIS — I129 Hypertensive chronic kidney disease with stage 1 through stage 4 chronic kidney disease, or unspecified chronic kidney disease: Secondary | ICD-10-CM | POA: Insufficient documentation

## 2023-04-27 DIAGNOSIS — Z720 Tobacco use: Secondary | ICD-10-CM | POA: Diagnosis present

## 2023-04-27 DIAGNOSIS — R7989 Other specified abnormal findings of blood chemistry: Secondary | ICD-10-CM | POA: Diagnosis not present

## 2023-04-27 DIAGNOSIS — I639 Cerebral infarction, unspecified: Secondary | ICD-10-CM | POA: Diagnosis present

## 2023-04-27 LAB — CBC
HCT: 45.8 % (ref 39.0–52.0)
Hemoglobin: 15.1 g/dL (ref 13.0–17.0)
MCH: 32 pg (ref 26.0–34.0)
MCHC: 33 g/dL (ref 30.0–36.0)
MCV: 97 fL (ref 80.0–100.0)
Platelets: 406 10*3/uL — ABNORMAL HIGH (ref 150–400)
RBC: 4.72 MIL/uL (ref 4.22–5.81)
RDW: 12.6 % (ref 11.5–15.5)
WBC: 6.8 10*3/uL (ref 4.0–10.5)
nRBC: 0 % (ref 0.0–0.2)

## 2023-04-27 LAB — COMPREHENSIVE METABOLIC PANEL
ALT: 11 U/L (ref 0–44)
AST: 17 U/L (ref 15–41)
Albumin: 3.7 g/dL (ref 3.5–5.0)
Alkaline Phosphatase: 63 U/L (ref 38–126)
Anion gap: 10 (ref 5–15)
BUN: 17 mg/dL (ref 8–23)
CO2: 26 mmol/L (ref 22–32)
Calcium: 8.8 mg/dL — ABNORMAL LOW (ref 8.9–10.3)
Chloride: 98 mmol/L (ref 98–111)
Creatinine, Ser: 1.49 mg/dL — ABNORMAL HIGH (ref 0.61–1.24)
GFR, Estimated: 52 mL/min — ABNORMAL LOW (ref >60)
Glucose, Bld: 113 mg/dL — ABNORMAL HIGH (ref 70–99)
Potassium: 2.7 mmol/L — CL (ref 3.5–5.1)
Sodium: 134 mmol/L — ABNORMAL LOW (ref 135–145)
Total Bilirubin: 0.8 mg/dL (ref 0.3–1.2)
Total Protein: 7.9 g/dL (ref 6.5–8.1)

## 2023-04-27 LAB — RAPID URINE DRUG SCREEN, HOSP PERFORMED
Amphetamines: NOT DETECTED
Barbiturates: NOT DETECTED
Benzodiazepines: NOT DETECTED
Cocaine: NOT DETECTED
Opiates: NOT DETECTED
Tetrahydrocannabinol: NOT DETECTED

## 2023-04-27 LAB — TROPONIN I (HIGH SENSITIVITY)
Troponin I (High Sensitivity): 800 ng/L (ref <18)
Troponin I (High Sensitivity): 702 ng/L (ref <18)

## 2023-04-27 LAB — MAGNESIUM: Magnesium: 2.1 mg/dL (ref 1.7–2.4)

## 2023-04-27 LAB — ETHANOL: Alcohol, Ethyl (B): <10 mg/dL (ref <10)

## 2023-04-27 MED ORDER — AMLODIPINE BESYLATE 5 MG PO TABS
10.0000 mg | ORAL_TABLET | Freq: Every day | ORAL | Status: DC
  Administered 2023-04-28: 10 mg via ORAL
  Filled 2023-04-27: qty 2

## 2023-04-27 MED ORDER — POTASSIUM CHLORIDE 10 MEQ/100ML IV SOLN
10.0000 meq | INTRAVENOUS | Status: AC
  Administered 2023-04-27 (×2): 10 meq via INTRAVENOUS
  Filled 2023-04-27 (×2): qty 100

## 2023-04-27 MED ORDER — LABETALOL HCL 5 MG/ML IV SOLN
10.0000 mg | Freq: Once | INTRAVENOUS | Status: AC
  Administered 2023-04-27: 10 mg via INTRAVENOUS
  Filled 2023-04-27: qty 4

## 2023-04-27 MED ORDER — HEPARIN BOLUS VIA INFUSION
4000.0000 [IU] | Freq: Once | INTRAVENOUS | Status: AC
  Administered 2023-04-27: 4000 [IU] via INTRAVENOUS

## 2023-04-27 MED ORDER — ASPIRIN 81 MG PO TBEC
81.0000 mg | DELAYED_RELEASE_TABLET | Freq: Every day | ORAL | Status: DC
  Administered 2023-04-28: 81 mg via ORAL
  Filled 2023-04-27: qty 1

## 2023-04-27 MED ORDER — ASPIRIN 325 MG PO TABS
325.0000 mg | ORAL_TABLET | Freq: Once | ORAL | Status: AC
  Administered 2023-04-27: 325 mg via ORAL
  Filled 2023-04-27: qty 1

## 2023-04-27 MED ORDER — AMLODIPINE BESYLATE 5 MG PO TABS
10.0000 mg | ORAL_TABLET | Freq: Once | ORAL | Status: AC
  Administered 2023-04-27: 10 mg via ORAL
  Filled 2023-04-27: qty 2

## 2023-04-27 MED ORDER — SODIUM CHLORIDE 0.9 % IV SOLN: Freq: Once | INTRAVENOUS | Status: AC

## 2023-04-27 MED ORDER — SODIUM CHLORIDE 0.9 % IV BOLUS
500.0000 mL | Freq: Once | INTRAVENOUS | Status: AC
  Administered 2023-04-27: 500 mL via INTRAVENOUS

## 2023-04-27 MED ORDER — LOSARTAN POTASSIUM 50 MG PO TABS
25.0000 mg | ORAL_TABLET | Freq: Every day | ORAL | Status: DC
  Administered 2023-04-28: 25 mg via ORAL
  Filled 2023-04-27: qty 1

## 2023-04-27 MED ORDER — POTASSIUM CHLORIDE CRYS ER 20 MEQ PO TBCR
40.0000 meq | EXTENDED_RELEASE_TABLET | Freq: Once | ORAL | Status: AC
  Administered 2023-04-28: 40 meq via ORAL
  Filled 2023-04-27: qty 2

## 2023-04-27 MED ORDER — HYDROCHLOROTHIAZIDE 12.5 MG PO TABS
12.5000 mg | ORAL_TABLET | Freq: Every day | ORAL | Status: DC
  Administered 2023-04-28: 12.5 mg via ORAL
  Filled 2023-04-27: qty 1

## 2023-04-27 MED ORDER — ACETAMINOPHEN 325 MG PO TABS: 650.0000 mg | ORAL_TABLET | Freq: Four times a day (QID) | ORAL | Status: DC | PRN

## 2023-04-27 MED ORDER — LOSARTAN POTASSIUM 25 MG PO TABS
100.0000 mg | ORAL_TABLET | Freq: Once | ORAL | Status: AC
  Administered 2023-04-27: 100 mg via ORAL
  Filled 2023-04-27: qty 4

## 2023-04-27 MED ORDER — HEPARIN (PORCINE) 25000 UT/250ML-% IV SOLN
1400.0000 [IU]/h | INTRAVENOUS | Status: DC
  Administered 2023-04-27: 1200 [IU]/h via INTRAVENOUS
  Filled 2023-04-27 (×2): qty 250

## 2023-04-27 MED ORDER — POTASSIUM CHLORIDE CRYS ER 20 MEQ PO TBCR
40.0000 meq | EXTENDED_RELEASE_TABLET | Freq: Once | ORAL | Status: AC
  Administered 2023-04-27: 40 meq via ORAL
  Filled 2023-04-27: qty 2

## 2023-04-27 MED ORDER — POLYETHYLENE GLYCOL 3350 17 G PO PACK
17.0000 g | PACK | Freq: Every day | ORAL | Status: DC | PRN
  Administered 2023-04-28: 17 g via ORAL
  Filled 2023-04-27: qty 1

## 2023-04-27 MED ORDER — ATORVASTATIN CALCIUM 40 MG PO TABS
40.0000 mg | ORAL_TABLET | Freq: Every day | ORAL | Status: DC
  Administered 2023-04-28: 40 mg via ORAL
  Filled 2023-04-27: qty 1

## 2023-04-27 MED ORDER — ACETAMINOPHEN 650 MG RE SUPP: 650.0000 mg | Freq: Four times a day (QID) | RECTAL | Status: DC | PRN

## 2023-04-27 NOTE — H&P (Addendum)
History and Physical    Derrick Gaines GNF:[id number] DOB: 1956-11-07 DOA: 04/27/2023  PCP: Benetta Spar, MD   Patient coming from: Home  I have personally briefly reviewed patient's old medical records in [facility]  Chief Complaint: Elevated blood pressure  HPI: Derrick Gaines is a 67 y.o. male with medical history significant for hypertension, CKD 3, CVA.  Patient was brought to the ED by patient's wife with reports of elevated blood pressure.  Patient's spouse reports that patient has not been taking his medication for several years, she would give it to him and she would find it is someplace else in the house, very rarely would he take it, he last took it 2 days ago.  Patient's spouse reported that over the past year, patient is not able to hold a conversation like he normally would.  Patient's mother told her that patient was repeating the same statements over and over again, she is unable to tell me when this happened.  Also patient has not had a bath in the past 1 week.  Spouse reports she has occasionally smelled alcohol on patient, after he hangs out with his friends, friend also reported that patient uses weed.  Reports sometimes at night when he is standing his right hand would shake.  Wife insists something is wrong.  Patient tells me he is fine.  At the time of my evaluation, patient is awake alert and oriented x 4.  Denies any complaints.  Specifically denies chest pain or difficulty breathing.  No lower extremity swelling.  He tells me he just wants to be left alone.  He does not want to be bothered with having to take meds.  Patient was in the ED 04/25/2023 with same complaints.  ED Course: Temperature 98.8.  Heart rate 80s to 113.  Respiratory 15-25.  Blood pressure systolic 150s to 846N.  Potassium 2.7.  Troponin 800. EDP talked to cardiologist Dr. Anne Fu, recommended admitting here, treat blood pressure trend troponins, cardiology consult in AM. IV heparin started  in ED.  Review of Systems: As per HPI all other systems reviewed and negative.  Past Medical History:  Diagnosis Date   Arthritis    Cancer (HCC)    Chronic kidney disease    Hypertension    Stroke (HCC)    mild left sided weakness   Vocal cord polyp     Past Surgical History:  Procedure Laterality Date   BACK SURGERY     MICROLARYNGOSCOPY N/A 08/29/2018   Procedure: DIRECT MICROLARYNGOSCOPY WITH EXCISION OF LARYNGEAL MASS;  Surgeon: Newman Pies, MD;  Location: [facility];  Service: ENT;  Laterality: N/A;   STERNOTOMY     THYMECTOMY       reports that he has been smoking cigarettes. He has a 15.00 pack-year smoking history. He has never used smokeless tobacco. He reports that he does not drink alcohol and does not use drugs.  No Known Allergies  Family History  Problem Relation Age of Onset   Diabetes Mother    Cancer Mother    Colon cancer Sister    Pneumonia Father    Emphysema Brother     Prior to Admission medications   Medication Sig Start Date End Date Taking? Authorizing Provider  amLODipine (NORVASC) 10 MG tablet Take 1 tablet (10 mg total) by mouth daily. 08/16/18   Aliene Beams, MD  aspirin EC 81 MG tablet Take 1 tablet (81 mg total) by mouth daily. 03/08/18  Aliene Beams, MD  Aspirin-Salicylamide-Caffeine (ARTHRITIS STRENGTH BC POWDER PO) Take by mouth.    [provider]  atorvastatin (LIPITOR) 20 MG tablet Take 1 tablet (20 mg total) by mouth daily. 04/11/18   Aliene Beams, MD  chlorthalidone (HYGROTON) 25 MG tablet Take by mouth. 03/18/22 03/18/23  [provider]  IBU 800 MG tablet Take 800 mg by mouth 2 (two) times daily. 08/22/20   [provider]  losartan (COZAAR) 100 MG tablet Take 1 tablet (100 mg total) by mouth daily. 05/31/18   Aliene Beams, MD  sertraline (ZOLOFT) 100 MG tablet Take 100 mg by mouth daily. 10/29/20   [provider]    Physical Exam: Vitals:   04/27/23 1830 04/27/23 1851 04/27/23  1900 04/27/23 2000  BP: (!) 163/107  (!) 159/106 (!) 158/101  Pulse: 89 87 86 86  Resp: 16 14 18 15   Temp:      TempSrc:      SpO2: 100% 99% 98% 96%  Weight:      Height:        Constitutional: NAD, calm, comfortable Vitals:   04/27/23 1830 04/27/23 1851 04/27/23 1900 04/27/23 2000  BP: (!) 163/107  (!) 159/106 (!) 158/101  Pulse: 89 87 86 86  Resp: 16 14 18 15   Temp:      TempSrc:      SpO2: 100% 99% 98% 96%  Weight:      Height:       Eyes: PERRL, lids and conjunctivae normal ENMT: Mucous membranes are moist.   Neck: normal, supple, no masses, no thyromegaly Respiratory: clear to auscultation bilaterally, no wheezing, no crackles. Normal respiratory effort. No accessory muscle use.  Cardiovascular: Regular rate and rhythm, no murmurs / rubs / gallops. No extremity edema.  Abdomen: no tenderness, no masses palpated. No hepatosplenomegaly. Bowel sounds positive.  Musculoskeletal: no clubbing / cyanosis. No joint deformity upper and lower extremities. Good ROM, no contractures. Normal muscle tone.  Skin: no rashes, lesions, ulcers. No induration Neurologic:  No facial asymmetry, speech fluent without aphasia, 5/5 strenght in all extremities Psychiatric: Normal judgment and insight. Alert and oriented x 4. Normal mood.   Labs on Admission: I have personally reviewed following labs and imaging studies  CBC: Recent Labs  Lab 04/25/23 1636 04/27/23 1801  WBC 6.4 6.8  NEUTROABS 3.5  --   HGB 15.4 15.1  HCT 46.2 45.8  MCV 97.7 97.0  PLT 398 406*   Basic Metabolic Panel: Recent Labs  Lab 04/25/23 1636 04/27/23 1801  NA 134* 134*  K 3.4* 2.7*  CL 99 98  CO2 26 26  GLUCOSE 111* 113*  BUN 16 17  CREATININE 1.47* 1.49*  CALCIUM 9.0 8.8*  MG  --  2.1   GFR: Estimated Creatinine Clearance: 66.5 mL/min (A) (by C-G formula based on SCr of 1.49 mg/dL (H)). Liver Function Tests: Recent Labs  Lab 04/27/23 1801  AST 17  ALT 11  ALKPHOS 63  BILITOT 0.8  PROT 7.9   ALBUMIN 3.7    Radiological Exams on Admission: DG Chest Port 1 View  Result Date: 04/27/2023 CLINICAL DATA:  Hypertension EXAM: PORTABLE CHEST 1 VIEW COMPARISON:  04/15/2023 x-ray and older. FINDINGS: Status post median sternotomy. Normal cardiopericardial silhouette. Decreased inflation from previous. No consolidation, pneumothorax or effusion. No edema. Lordotic x-ray. Overlapping cardiac leads. IMPRESSION: No acute cardiopulmonary disease when adjusting for level of inflation Electronically Signed   By: Karen Kays M.D.   On: 04/27/2023 18:09  EKG: Independently reviewed. Sinus tach- rate 102, no significant change from prior. PVC.  QTc 490  Assessment/Plan Principal Problem:   Elevated troponin Active Problems:   Essential hypertension   Abnormal behavior   CVA (cerebral vascular accident) (HCC)   Tobacco abuse   High cholesterol   Hypokalemia   CKD (chronic kidney disease) stage 3, GFR 30-59 ml/min (HCC)   Assessment and Plan: * Elevated troponin Troponin 800 > 702.  No cardiac history.  EKG sinus tachycardia, rare PVC, no significant ST or T wave changes from prior.  He denies chest pain, difficulty breathing or any symptoms referable to CAD at this time. - EDP talked to Dr. Trudi Ida demand ischemia from uncontrolled hypertension, recommended trending troponin, control blood pressure, okay to stay at [facility] cardiology team can see him in the morning -Repeat troponin in a.m. -Obtain echocardiogram -Cardiology consult -N.p.o. midnight, pending cardiology eval -Aspirin 325mg  given, continue 81 mg daily ,  Start Atorvastatin 40mg  daily - Lipid panel a.m - Continue heparin drip started in the ED for now  Essential hypertension Uncontrolled hypertension due to noncompliance.  On Norvasc 5, losartan 100 HCTZ- 12.5 - As he has been noncompliant, will restart losartan at reduced dose 25 mg daily, HCTZ 12.5 -Continue increased dose Norvasc 10 mg started in  ED.  Abnormal behavior Multiple complaints per spouse,-not holding conversation like he should, repeating statements- per Mother, occasional tremor in the right upper extremity, possible alcohol use, reported marijuana use, not having his bath for 1 week.  On my evaluation, patient is A & O X 4. He denies these complaints.  Mental status appears to be normal.  Denies alcohol intake. -Head CT pending -UDS- clean - Blood alcohol < 10 -Likely just follow-up as outpatient.  CKD (chronic kidney disease) stage 3, GFR 30-59 ml/min (HCC) CKD 3 A. Cr- 1.49. At baseline.   Hypokalemia K- 2.7. Mag- 2.1 - Replete  High cholesterol - Lipid panel AM.   - Start atorvastatin 40 mg daily.  CVA (cerebral vascular accident) (HCC) Stable.  No focal deficits.   DVT prophylaxis:  Heparin Code Status: FULL code Family Communication: Spouse at bedside. Disposition Plan: ~ 1 - 2 days Consults called:  Cardiology Admission status:  Obs tele    Author: Onnie Boer, MD 04/27/2023 10:03 PM  For on call review [email or web address].

## 2023-04-27 NOTE — Assessment & Plan Note (Addendum)
Uncontrolled hypertension due to noncompliance.  On Norvasc 5, losartan 100 HCTZ- 12.5 - As he has been noncompliant, will restart losartan at reduced dose 25 mg daily, HCTZ 12.5 -Continue increased dose Norvasc 10 mg started in ED.

## 2023-04-27 NOTE — Assessment & Plan Note (Addendum)
Troponin 800 > 702.  No cardiac history.  EKG sinus tachycardia, rare PVC, no significant ST or T wave changes from prior.  He denies chest pain, difficulty breathing or any symptoms referable to CAD at this time. - EDP talked to Dr. Trudi Ida demand ischemia from uncontrolled hypertension, recommended trending troponin, control blood pressure, okay to stay at [facility] cardiology team can see him in the morning -Repeat troponin in a.m. -Obtain echocardiogram -Cardiology consult -N.p.o. midnight, pending cardiology eval -Aspirin 325mg  given, continue 81 mg daily ,  Start Atorvastatin 40mg  daily - Lipid panel a.m - Continue heparin drip started in the ED for now

## 2023-04-27 NOTE — ED Notes (Signed)
ED TO INPATIENT HANDOFF REPORT  ED Nurse Name and Phone #: Jacques Earthly Name/Age/Gender Derrick Gaines 67 y.o. male Room/Bed: APA07/APA07  Code Status   Code Status: Prior  Home/SNF/Other Home Patient oriented to: self, place, time, and situation Is this baseline? Yes   Triage Complete: Triage complete  Chief Complaint Elevated troponin [R79.89]  Triage Note Pt from home with c/o hypertension, says he was seen here Monday for the same, pt spouse is with pt and says he needs to be evaluated, he hasn't had a bath since last week and will not take his BP meds. Pt says he didn't think he need BP meds, but when nurse told him his BP, pt states, Well, "sounds like I do need it."    Allergies No Known Allergies  Level of Care/Admitting Diagnosis ED Disposition     ED Disposition  Admit   Condition  --   Comment  Hospital Area: [facility] [[id number]]  Level of Care: Stepdown [14]  Covid Evaluation: Asymptomatic - no recent exposure (last 10 days) testing not required  Diagnosis: Elevated troponin [321909]  Admitting Physician: Cresenciano Lick  Attending Physician: Onnie Boer [email or web address]          B Medical/Surgery History Past Medical History:  Diagnosis Date   Arthritis    Cancer (HCC)    Chronic kidney disease    Hypertension    Stroke (HCC)    mild left sided weakness   Vocal cord polyp    Past Surgical History:  Procedure Laterality Date   BACK SURGERY     MICROLARYNGOSCOPY N/A 08/29/2018   Procedure: DIRECT MICROLARYNGOSCOPY WITH EXCISION OF LARYNGEAL MASS;  Surgeon: Newman Pies, MD;  Location: [facility];  Service: ENT;  Laterality: N/A;   STERNOTOMY     THYMECTOMY       A IV Location/Drains/Wounds Patient Lines/Drains/Airways Status     Active Line/Drains/Airways     Name Placement date Placement time Site Days   Peripheral IV 04/27/23 20 G Right Antecubital 04/27/23  1750  Antecubital  less than 1    Peripheral IV 04/27/23 20 G Posterior;Right Hand 04/27/23  1940  Hand  less than 1            Intake/Output Last 24 hours No intake or output data in the 24 hours ending 04/27/23 2023  Labs/Imaging Results for orders placed or performed during the hospital encounter of 04/27/23 (from the past 48 hour(s))  CBC     Status: Abnormal   Collection Time: 04/27/23  6:01 PM  Result Value Ref Range   WBC 6.8 4.0 - 10.5 K/uL   RBC 4.72 4.22 - 5.81 MIL/uL   Hemoglobin 15.1 13.0 - 17.0 g/dL   HCT 16.1 09.6 - 04.5 %   MCV 97.0 80.0 - 100.0 fL   MCH 32.0 26.0 - 34.0 pg   MCHC 33.0 30.0 - 36.0 g/dL   RDW 40.9 81.1 - 91.4 %   Platelets 406 (H) 150 - 400 K/uL   nRBC 0.0 0.0 - 0.2 %    Comment: Performed at [facility], [address], [place], [place] [address]  Troponin I (High Sensitivity)     Status: Abnormal   Collection Time: 04/27/23  6:01 PM  Result Value Ref Range   Troponin I (High Sensitivity) 800 (HH) <18 ng/L    Comment: CRITICAL RESULT CALLED TO, READ BACK BY AND VERIFIED WITH PRUITT,G ON 04/27/23 AT 1900 BY LOY,C (NOTE) Elevated  high sensitivity troponin I (hsTnI) values and significant  changes across serial measurements may suggest ACS but many other  chronic and acute conditions are known to elevate hsTnI results.  Refer to the "Links" section for chest pain algorithms and additional  guidance. Performed at [facility], [address] [place], [place] [address]   Comprehensive metabolic panel     Status: Abnormal   Collection Time: 04/27/23  6:01 PM  Result Value Ref Range   Sodium 134 (L) 135 - 145 mmol/L   Potassium 2.7 (LL) 3.5 - 5.1 mmol/L    Comment: DELTA CHECK NOTED CRITICAL RESULT CALLED TO, READ BACK BY AND VERIFIED WITH PRUITT,G ON 04/27/23 AT 1900 BY LOY,C    Chloride 98 98 - 111 mmol/L   CO2 26 22 - 32 mmol/L   Glucose, Bld 113 (H) 70 - 99 mg/dL    Comment: Glucose reference range applies only to samples taken after fasting for at least 8 hours.   BUN 17 8  - 23 mg/dL   Creatinine, Ser 2.95 (H) 0.61 - 1.24 mg/dL   Calcium 8.8 (L) 8.9 - 10.3 mg/dL   Total Protein 7.9 6.5 - 8.1 g/dL   Albumin 3.7 3.5 - 5.0 g/dL   AST 17 15 - 41 U/L   ALT 11 0 - 44 U/L   Alkaline Phosphatase 63 38 - 126 U/L   Total Bilirubin 0.8 0.3 - 1.2 mg/dL   GFR, Estimated 52 (L) >60 mL/min    Comment: (NOTE) Calculated using the CKD-EPI Creatinine Equation (2021)    Anion gap 10 5 - 15    Comment: Performed at [facility], [address], [place], [place] [address]  Magnesium     Status: None   Collection Time: 04/27/23  6:01 PM  Result Value Ref Range   Magnesium 2.1 1.7 - 2.4 mg/dL    Comment: Performed at [facility], [address], [place], [place] [address]   DG Chest Port 1 View  Result Date: 04/27/2023 CLINICAL DATA:  Hypertension EXAM: PORTABLE CHEST 1 VIEW COMPARISON:  04/15/2023 x-ray and older. FINDINGS: Status post median sternotomy. Normal cardiopericardial silhouette. Decreased inflation from previous. No consolidation, pneumothorax or effusion. No edema. Lordotic x-ray. Overlapping cardiac leads. IMPRESSION: No acute cardiopulmonary disease when adjusting for level of inflation Electronically Signed   By: Karen Kays M.D.   On: 04/27/2023 18:09    Pending Labs Unresulted Labs (From admission, onward)     Start     Ordered   04/29/23 0500  Heparin level (unfractionated)  Daily,   R      04/27/23 1921   04/28/23 0500  CBC  Daily,   R      04/27/23 1921   04/28/23 0200  Heparin level (unfractionated)  Once-Timed,   URGENT        04/27/23 1921   04/27/23 2009  Ethanol  Once,   URGENT        04/27/23 2008   04/27/23 2008  Urine rapid drug screen (hosp performed)  Once,   STAT        04/27/23 2008            Vitals/Pain Today's Vitals   04/27/23 1851 04/27/23 1900 04/27/23 1953 04/27/23 2000  BP:  (!) 159/106  (!) 158/101  Pulse: 87 86  86  Resp: 14 18  15   Temp:      TempSrc:      SpO2: 99% 98%  96%  Weight:  Height:       PainSc:   0-No pain     Isolation Precautions No active isolations  Medications Medications  potassium chloride 10 mEq in 100 mL IVPB (10 mEq Intravenous New Bag/Given 04/27/23 1937)  heparin ADULT infusion 100 units/mL (25000 units/228mL) (1,200 Units/hr Intravenous New Bag/Given 04/27/23 1932)  potassium chloride SA (KLOR-CON M) CR tablet 40 mEq (has no administration in time range)  sodium chloride 0.9 % bolus 500 mL (500 mLs Intravenous Bolus 04/27/23 1810)  amLODipine (NORVASC) tablet 10 mg (10 mg Oral Given 04/27/23 1808)  losartan (COZAAR) tablet 100 mg (100 mg Oral Given 04/27/23 1808)  aspirin tablet 325 mg (325 mg Oral Given 04/27/23 1939)  0.9 %  sodium chloride infusion ( Intravenous New Bag/Given 04/27/23 1936)  heparin bolus via infusion 4,000 Units (4,000 Units Intravenous Bolus from Bag 04/27/23 1930)  labetalol (NORMODYNE) injection 10 mg (10 mg Intravenous Given 04/27/23 2014)    Mobility walks     Focused Assessments    R Recommendations: See Admitting Provider Note  Report given to:   Additional Notes: A&O; Ambu; Heparin at 1200units/hr; NS at 54ml/hr; KCL at 158ml/hr

## 2023-04-27 NOTE — ED Provider Notes (Signed)
[place] EMERGENCY DEPARTMENT AT [facility] Provider Note   CSN: [id number] Arrival date & time: 04/27/23  1704     History  Chief Complaint  Patient presents with   Hypertension    Derrick Gaines is a 67 y.o. male.  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  Pt was brought to the ED today because of hypertension.  Pt was here on Monday for the same.  He admits to not taking his bp medications.  He was told to take his bp meds, but has not.  His wife said he's not wanted to do much and has not been taking care of his hygiene.  Pt denies current cp.  Pt's wife said pt has also been drinking and possibly doing drugs.  She is also concerned he may have had a stroke or has dementia b/c he's been off balance for several weeks.  She said he does not know people like he should.          Home Medications Prior to Admission medications   Medication Sig Start Date End Date Taking? Authorizing Provider  amLODipine (NORVASC) 5 MG tablet Take 5 mg by mouth daily. 04/15/23  Yes [provider]  DULoxetine (CYMBALTA) 30 MG capsule Take 30 mg by mouth 2 (two) times daily. 04/15/23  Yes [provider]  losartan-hydrochlorothiazide (HYZAAR) 100-12.5 MG tablet Take 1 tablet by mouth daily. 04/15/23  Yes [provider]  amLODipine (NORVASC) 10 MG tablet Take 1 tablet (10 mg total) by mouth daily. 08/16/18   Aliene Beams, MD  aspirin EC 81 MG tablet Take 1 tablet (81 mg total) by mouth daily. 03/08/18   Aliene Beams, MD  Aspirin-Salicylamide-Caffeine (ARTHRITIS STRENGTH BC POWDER PO) Take by mouth.    [provider]  atorvastatin (LIPITOR) 20 MG tablet Take 1 tablet (20 mg total) by mouth daily. 04/11/18   Aliene Beams, MD  chlorthalidone (HYGROTON) 25 MG tablet Take by mouth. 03/18/22 03/18/23  [provider]  IBU 800 MG tablet  Take 800 mg by mouth 2 (two) times daily. 08/22/20   [provider]  losartan (COZAAR) 100 MG tablet Take 1 tablet (100 mg total) by mouth daily. 05/31/18   Aliene Beams, MD  sertraline (ZOLOFT) 100 MG tablet Take 100 mg by mouth daily. 10/29/20   [provider]      Allergies    Patient has no known allergies.    Review of Systems   Review of Systems  All other systems reviewed and are negative.   Physical Exam Updated Vital Signs BP (!) 158/101   Pulse 86   Temp 98.8 F (37.1 C) (Oral)   Resp 15   Ht 6\' 1"  (1.854 m)   Wt 117.9 kg   SpO2 96%   BMI 34.30 kg/m  Physical Exam Vitals and nursing note reviewed.  Constitutional:      Appearance: Normal appearance.  HENT:     Head: Normocephalic and atraumatic.     Right Ear: External ear normal.     Left Ear: External ear normal.     Nose: Nose normal.     Mouth/Throat:     Mouth: Mucous membranes are moist.     Pharynx: Oropharynx is clear.  Eyes:     Extraocular Movements: Extraocular movements intact.     Conjunctiva/sclera: Conjunctivae normal.  Pupils: Pupils are equal, round, and reactive to light.  Cardiovascular:     Rate and Rhythm: Normal rate and regular rhythm.     Pulses: Normal pulses.     Heart sounds: Normal heart sounds.  Pulmonary:     Effort: Pulmonary effort is normal.     Breath sounds: Normal breath sounds.  Abdominal:     General: Abdomen is flat. Bowel sounds are normal.     Palpations: Abdomen is soft.  Musculoskeletal:        General: Normal range of motion.     Cervical back: Normal range of motion and neck supple.  Skin:    General: Skin is warm.     Capillary Refill: Capillary refill takes less than 2 seconds.  Neurological:     General: No focal deficit present.     Mental Status: He is alert and oriented to person, place, and time.  Psychiatric:        Mood and Affect: Mood normal.        Behavior: Behavior normal.     ED Results / Procedures / Treatments    Labs (all labs ordered are listed, but only abnormal results are displayed) Labs Reviewed  CBC - Abnormal; Notable for the following components:      Result Value   Platelets 406 (*)    All other components within normal limits  COMPREHENSIVE METABOLIC PANEL - Abnormal; Notable for the following components:   Sodium 134 (*)    Potassium 2.7 (*)    Glucose, Bld 113 (*)    Creatinine, Ser 1.49 (*)    Calcium 8.8 (*)    GFR, Estimated 52 (*)    All other components within normal limits  TROPONIN I (HIGH SENSITIVITY) - Abnormal; Notable for the following components:   Troponin I (High Sensitivity) 800 (*)    All other components within normal limits  MAGNESIUM  CBC  HEPARIN LEVEL (UNFRACTIONATED)  RAPID URINE DRUG SCREEN, HOSP PERFORMED  ETHANOL  TROPONIN I (HIGH SENSITIVITY)    EKG EKG Interpretation  Date/Time:  Wednesday Apr 27 2023 17:46:37 EDT Ventricular Rate:  102 PR Interval:  125 QRS Duration: 94 QT Interval:  376 QTC Calculation: 490 R Axis:   -21 Text Interpretation: Sinus tachycardia Ventricular premature complex Probable left atrial enlargement Borderline left axis deviation Abnormal R-wave progression, early transition Nonspecific repol abnormality, diffuse leads No significant change since last tracing Confirmed by Jacalyn Lefevre [phone]) on 04/27/2023 6:16:01 PM  Radiology DG Chest Port 1 View  Result Date: 04/27/2023 CLINICAL DATA:  Hypertension EXAM: PORTABLE CHEST 1 VIEW COMPARISON:  04/15/2023 x-ray and older. FINDINGS: Status post median sternotomy. Normal cardiopericardial silhouette. Decreased inflation from previous. No consolidation, pneumothorax or effusion. No edema. Lordotic x-ray. Overlapping cardiac leads. IMPRESSION: No acute cardiopulmonary disease when adjusting for level of inflation Electronically Signed   By: Karen Kays M.D.   On: 04/27/2023 18:09    Procedures Procedures    Medications Ordered in ED Medications  potassium chloride 10  mEq in 100 mL IVPB (10 mEq Intravenous New Bag/Given 04/27/23 1937)  heparin ADULT infusion 100 units/mL (25000 units/259mL) (1,200 Units/hr Intravenous New Bag/Given 04/27/23 1932)  labetalol (NORMODYNE) injection 10 mg (has no administration in time range)  potassium chloride SA (KLOR-CON M) CR tablet 40 mEq (has no administration in time range)  sodium chloride 0.9 % bolus 500 mL (500 mLs Intravenous Bolus 04/27/23 1810)  amLODipine (NORVASC) tablet 10 mg (10 mg Oral Given 04/27/23 1808)  losartan (COZAAR) tablet 100 mg (100 mg Oral Given 04/27/23 1808)  aspirin tablet 325 mg (325 mg Oral Given 04/27/23 1939)  0.9 %  sodium chloride infusion ( Intravenous New Bag/Given 04/27/23 1936)  heparin bolus via infusion 4,000 Units (4,000 Units Intravenous Bolus from Bag 04/27/23 1930)    ED Course/ Medical Decision Making/ A&P                             Medical Decision Making Amount and/or Complexity of Data Reviewed Labs: ordered. Radiology: ordered.  Risk OTC drugs. Prescription drug management. Decision regarding hospitalization.   This patient presents to the ED for concern of hypertension, this involves an extensive number of treatment options, and is a complaint that carries with it a high risk of complications and morbidity.  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  The pertinent results include:  cbc nl, cmp with K low at 2.7 and cr 1.49, trop elevated at 800   Imaging Studies ordered:  I ordered imaging studies including cxr  I independently visualized and  interpreted imaging which showed  No acute cardiopulmonary disease when adjusting for level of  inflation   I agree with the radiologist interpretation   Cardiac Monitoring:  The patient was maintained on a cardiac monitor.  I personally viewed and interpreted the cardiac monitored which showed an underlying rhythm of: nsr   Medicines ordered and prescription drug management:  I ordered medication including norvasc and cozaar  for htn  Reevaluation of the patient after these medicines showed that the patient improved I have reviewed the patients home medicines and have made adjustments as needed   Test Considered:  ct   Critical Interventions:  Heparin/bp control   Consultations Obtained:  I requested consultation with the cardiologist (Dr. Anne Fu),  and discussed lab and imaging findings as well as pertinent plan - he recommends keeping pt here.  Treat bp.  Trend troponins.  Cards can see in the am.  If he gets worse, he can always be transferred.   Pt d/w Dr. Mariea Clonts (triad) for admission.   Problem List / ED Course:  Malignant HTN:  pt given home dose of cozaar and amlodipine.  BP still elevated, so he's given IV labetalol.  I told pt's wife that we can't force him to take his bp meds when he gets home.  He will need to realize that he needs the medication. NSTEMI:  heparin started.  EKG nl.  Trend troponins. Hypokalemia:  pt given kdur and kcl.  Mg ok. Off balance:  CT ordered.   ? Drug/alcohol abuse:   UDS/Etoh level ordered.   Reevaluation:  After the interventions noted above, I reevaluated the patient and found that they have :improved   Social Determinants of Health:  Lives at home   Dispostion:  After consideration of the diagnostic results and the patients response to treatment, I feel that the patent would benefit from admission.    CRITICAL CARE Performed by: Jacalyn Lefevre   Total critical care time: 30 minutes  Critical care time was  exclusive of  separately billable procedures and treating other patients.  Critical care was necessary to treat or prevent imminent or life-threatening deterioration.  Critical care was time spent personally by me on the following activities: development of treatment plan with patient and/or surrogate as well as nursing, discussions with consultants, evaluation of patient's response to treatment, examination of patient, obtaining history from patient or surrogate, ordering and performing treatments and interventions, ordering and review of laboratory studies, ordering and review of radiographic studies, pulse oximetry and re-evaluation of patient's condition.         Final Clinical Impression(s) / ED Diagnoses Final diagnoses:  NSTEMI (non-ST elevated myocardial infarction) (HCC)  History of medication noncompliance  Hypokalemia  Malignant hypertension    Rx / DC Orders ED Discharge Orders     None         Jacalyn Lefevre, MD 04/27/23 2014

## 2023-04-27 NOTE — Progress Notes (Signed)
ANTICOAGULATION CONSULT NOTE - Initial Consult  Pharmacy Consult for heparin Indication: chest pain/ACS  No Known Allergies  Patient Measurements: Height: 6\' 1"  (185.4 cm) Weight: 117.9 kg (260 lb) IBW/kg (Calculated) : 79.9 Heparin Dosing Weight: 105kg  Vital Signs: Temp: 98.8 F (37.1 C) (05/01 1723) Temp Source: Oral (05/01 1723) BP: 163/107 (05/01 1830) Pulse Rate: 87 (05/01 1851)  Labs: Recent Labs    04/25/23 1636 04/27/23 1801  HGB 15.4 15.1  HCT 46.2 45.8  PLT 398 406*  CREATININE 1.47* 1.49*  TROPONINIHS  --  800*    Estimated Creatinine Clearance: 66.5 mL/min (A) (by C-G formula based on SCr of 1.49 mg/dL (H)).   Medical History: Past Medical History:  Diagnosis Date   Arthritis    Cancer (HCC)    Chronic kidney disease    Hypertension    Stroke (HCC)    mild left sided weakness   Vocal cord polyp      Assessment: 46 yoM admitted with ACS, to start IV heparin. CBC wnl, no AC PTA, trops elevated.   Goal of Therapy:  Heparin level 0.3-0.7 units/ml Monitor platelets by anticoagulation protocol: Yes   Plan:  -Heparin 4000 units x1 -Heparin 1200 units/hr -Check 6-hr heparin level -Monitor heparin level, CBC, S/Sx bleeding daily  Fredonia Highland, PharmD, BCPS, [facility] Clinical Pharmacist Please check AMION for all [facility] Pharmacy numbers 04/27/2023

## 2023-04-27 NOTE — Assessment & Plan Note (Signed)
Stable.  No focal deficits.

## 2023-04-27 NOTE — ED Triage Notes (Signed)
Pt from home with c/o hypertension, says he was seen here Monday for the same, pt spouse is with pt and says he needs to be evaluated, he hasn't had a bath since last week and will not take his BP meds. Pt says he didn't think he need BP meds, but when nurse told him his BP, pt states, Well, "sounds like I do need it."

## 2023-04-27 NOTE — ED Notes (Signed)
Patient transported to CT 

## 2023-04-27 NOTE — Assessment & Plan Note (Addendum)
CKD 3 A. Cr- 1.49. At baseline.

## 2023-04-27 NOTE — Assessment & Plan Note (Addendum)
K- 2.7. Mag- 2.1 - Replete

## 2023-04-27 NOTE — ED Notes (Signed)
Attempted to call report to ICU.

## 2023-04-27 NOTE — Assessment & Plan Note (Signed)
-   Lipid panel AM.   - Start atorvastatin 40 mg daily.

## 2023-04-27 NOTE — Assessment & Plan Note (Signed)
Multiple complaints per spouse,-not holding conversation like he should, repeating statements- per Mother, occasional tremor in the right upper extremity, possible alcohol use, reported marijuana use, not having his bath for 1 week.  On my evaluation, patient is A & O X 4. He denies these complaints.  Mental status appears to be normal.  Denies alcohol intake. -Head CT pending -UDS- clean - Blood alcohol < 10 -Likely just follow-up as outpatient.

## 2023-04-28 ENCOUNTER — Other Ambulatory Visit (HOSPITAL_COMMUNITY): Payer: Self-pay | Admitting: *Deleted

## 2023-04-28 ENCOUNTER — Other Ambulatory Visit: Payer: Self-pay

## 2023-04-28 ENCOUNTER — Observation Stay (HOSPITAL_BASED_OUTPATIENT_CLINIC_OR_DEPARTMENT_OTHER): Payer: Medicare PPO

## 2023-04-28 DIAGNOSIS — E876 Hypokalemia: Secondary | ICD-10-CM | POA: Diagnosis not present

## 2023-04-28 DIAGNOSIS — R462 Strange and inexplicable behavior: Secondary | ICD-10-CM | POA: Diagnosis not present

## 2023-04-28 DIAGNOSIS — R7989 Other specified abnormal findings of blood chemistry: Secondary | ICD-10-CM

## 2023-04-28 DIAGNOSIS — N183 Chronic kidney disease, stage 3 unspecified: Secondary | ICD-10-CM | POA: Diagnosis not present

## 2023-04-28 DIAGNOSIS — R778 Other specified abnormalities of plasma proteins: Secondary | ICD-10-CM | POA: Diagnosis not present

## 2023-04-28 DIAGNOSIS — I10 Essential (primary) hypertension: Secondary | ICD-10-CM | POA: Diagnosis not present

## 2023-04-28 DIAGNOSIS — Z8673 Personal history of transient ischemic attack (TIA), and cerebral infarction without residual deficits: Secondary | ICD-10-CM | POA: Diagnosis not present

## 2023-04-28 DIAGNOSIS — Z7982 Long term (current) use of aspirin: Secondary | ICD-10-CM | POA: Diagnosis not present

## 2023-04-28 DIAGNOSIS — Z85828 Personal history of other malignant neoplasm of skin: Secondary | ICD-10-CM | POA: Diagnosis not present

## 2023-04-28 DIAGNOSIS — Z79899 Other long term (current) drug therapy: Secondary | ICD-10-CM | POA: Diagnosis not present

## 2023-04-28 DIAGNOSIS — I129 Hypertensive chronic kidney disease with stage 1 through stage 4 chronic kidney disease, or unspecified chronic kidney disease: Secondary | ICD-10-CM | POA: Diagnosis not present

## 2023-04-28 LAB — CBC
HCT: 42.5 % (ref 39.0–52.0)
Hemoglobin: 14 g/dL (ref 13.0–17.0)
MCH: 32.4 pg (ref 26.0–34.0)
MCHC: 32.9 g/dL (ref 30.0–36.0)
MCV: 98.4 fL (ref 80.0–100.0)
Platelets: 334 10*3/uL (ref 150–400)
RBC: 4.32 MIL/uL (ref 4.22–5.81)
RDW: 12.7 % (ref 11.5–15.5)
WBC: 6.7 10*3/uL (ref 4.0–10.5)
nRBC: 0 % (ref 0.0–0.2)

## 2023-04-28 LAB — ECHOCARDIOGRAM COMPLETE
AR max vel: 2.43 cm2
AV Area VTI: 2.27 cm2
AV Area mean vel: 2.45 cm2
AV Mean grad: 2.1 mmHg
AV Peak grad: 5.1 mmHg
Ao pk vel: 1.12 m/s
Area-P 1/2: 2.83 cm2
Height: 73 in
MV M vel: 0.69 m/s
MV Peak grad: 1.9 mmHg
S' Lateral: 3.8 cm
Weight: 3636.71 oz

## 2023-04-28 LAB — BASIC METABOLIC PANEL
Anion gap: 6 (ref 5–15)
BUN: 16 mg/dL (ref 8–23)
CO2: 28 mmol/L (ref 22–32)
Calcium: 8.4 mg/dL — ABNORMAL LOW (ref 8.9–10.3)
Chloride: 102 mmol/L (ref 98–111)
Creatinine, Ser: 1.38 mg/dL — ABNORMAL HIGH (ref 0.61–1.24)
GFR, Estimated: 57 mL/min — ABNORMAL LOW (ref >60)
Glucose, Bld: 110 mg/dL — ABNORMAL HIGH (ref 70–99)
Potassium: 3.4 mmol/L — ABNORMAL LOW (ref 3.5–5.1)
Sodium: 136 mmol/L (ref 135–145)

## 2023-04-28 LAB — HEPARIN LEVEL (UNFRACTIONATED)
Heparin Unfractionated: <0.1 IU/mL — ABNORMAL LOW (ref 0.30–0.70)
Heparin Unfractionated: 0.48 IU/mL (ref 0.30–0.70)

## 2023-04-28 LAB — LIPID PANEL
Cholesterol: 118 mg/dL (ref 0–200)
HDL: 31 mg/dL — ABNORMAL LOW (ref >40)
LDL Cholesterol: 57 mg/dL (ref 0–99)
Total CHOL/HDL Ratio: 3.8 RATIO
Triglycerides: 151 mg/dL — ABNORMAL HIGH (ref <150)
VLDL: 30 mg/dL (ref 0–40)

## 2023-04-28 LAB — TROPONIN I (HIGH SENSITIVITY): Troponin I (High Sensitivity): 671 ng/L (ref <18)

## 2023-04-28 LAB — HIV ANTIBODY (ROUTINE TESTING W REFLEX): HIV Screen 4th Generation wRfx: NONREACTIVE

## 2023-04-28 MED ORDER — HYDROCHLOROTHIAZIDE 12.5 MG PO TABS: 12.5000 mg | ORAL_TABLET | Freq: Every day | ORAL | 2 refills | Status: DC

## 2023-04-28 MED ORDER — ATORVASTATIN CALCIUM 40 MG PO TABS: 40.0000 mg | ORAL_TABLET | Freq: Every day | ORAL | 1 refills | Status: DC

## 2023-04-28 MED ORDER — POTASSIUM CHLORIDE CRYS ER 20 MEQ PO TBCR
40.0000 meq | EXTENDED_RELEASE_TABLET | Freq: Once | ORAL | Status: AC
  Administered 2023-04-28: 40 meq via ORAL
  Filled 2023-04-28: qty 2

## 2023-04-28 MED ORDER — HEPARIN BOLUS VIA INFUSION
2000.0000 [IU] | Freq: Once | INTRAVENOUS | Status: AC
  Administered 2023-04-28: 2000 [IU] via INTRAVENOUS
  Filled 2023-04-28: qty 2000

## 2023-04-28 MED ORDER — ASPIRIN 81 MG PO TBEC: 81.0000 mg | DELAYED_RELEASE_TABLET | Freq: Every day | ORAL | 12 refills | Status: DC

## 2023-04-28 MED ORDER — AMLODIPINE BESYLATE 10 MG PO TABS: 10.0000 mg | ORAL_TABLET | Freq: Every day | ORAL | 1 refills | Status: DC

## 2023-04-28 MED ORDER — PERFLUTREN LIPID MICROSPHERE
1.0000 mL | INTRAVENOUS | Status: AC | PRN
  Administered 2023-04-28: 4 mL via INTRAVENOUS

## 2023-04-28 MED ORDER — LOSARTAN POTASSIUM 25 MG PO TABS: 25.0000 mg | ORAL_TABLET | Freq: Every day | ORAL | 1 refills | Status: DC

## 2023-04-28 NOTE — Progress Notes (Signed)
Pt discharged to home. DC instructions given. No concerns voiced. Pt left ambulatory to steady to meet ride downstairs. Refused to be carried in wheelchair.

## 2023-04-28 NOTE — Progress Notes (Signed)
ANTICOAGULATION CONSULT NOTE  Pharmacy Consult for heparin Indication: chest pain/ACS Brief A/P: Heparin level subtherapeutic Increase Heparin rate  No Known Allergies  Patient Measurements: Height: 6\' 1"  (185.4 cm) Weight: 103.1 kg (227 lb 4.7 oz) IBW/kg (Calculated) : 79.9  Vital Signs: Temp: 97.8 F (36.6 C) (05/02 0152) Temp Source: Oral (05/01 1723) BP: 125/85 (05/02 0152) Pulse Rate: 64 (05/02 0152)  Labs: Recent Labs    04/25/23 1636 04/27/23 1801 04/27/23 1957 04/28/23 0154 04/28/23 0155  HGB 15.4 15.1  --  14.0  --   HCT 46.2 45.8  --  42.5  --   PLT 398 406*  --  334  --   HEPARINUNFRC  --   --   --   --  <0.10*  CREATININE 1.47* 1.49*  --  1.38*  --   TROPONINIHS  --  800* 702*  --   --      Estimated Creatinine Clearance: 67.3 mL/min (A) (by C-G formula based on SCr of 1.38 mg/dL (H)).  Assessment: 67 y.o. male with elevated troponin, possible ACS for heparin  Goal of Therapy:  Heparin level 0.3-0.7 units/ml Monitor platelets by anticoagulation protocol: Yes   Plan:  Heparin 2000 units IV bolus, then increase heparin 1600 units/hr Check heparin level in 6 hours.  Geannie Risen, PharmD, BCPS  04/28/2023

## 2023-04-28 NOTE — Progress Notes (Signed)
ANTICOAGULATION CONSULT NOTE   Pharmacy Consult for heparin Indication: chest pain/ACS  No Known Allergies  Patient Measurements: Height: 6\' 1"  (185.4 cm) Weight: 103.1 kg (227 lb 4.7 oz) IBW/kg (Calculated) : 79.9 Heparin Dosing Weight: 105kg  Vital Signs: Temp: 98.8 F (37.1 C) (05/02 0545) BP: 122/81 (05/02 0545) Pulse Rate: 68 (05/02 0545)  Labs: Recent Labs    04/25/23 1636 04/27/23 1801 04/27/23 1957 04/28/23 0154 04/28/23 0155 04/28/23 1041  HGB 15.4 15.1  --  14.0  --   --   HCT 46.2 45.8  --  42.5  --   --   PLT 398 406*  --  334  --   --   HEPARINUNFRC  --   --   --   --  <0.10* 0.48  CREATININE 1.47* 1.49*  --  1.38*  --   --   TROPONINIHS  --  800* 702* 671*  --   --     Estimated Creatinine Clearance: 67.3 mL/min (A) (by C-G formula based on SCr of 1.38 mg/dL (H)).   Medical History: Past Medical History:  Diagnosis Date   Arthritis    Cancer (HCC)    Chronic kidney disease    Hypertension    Stroke (HCC)    mild left sided weakness   Vocal cord polyp      Assessment: 49 yoM admitted with ACS, to start IV heparin. CBC wnl, no AC PTA, trops elevated.  Follow up heparin level this morning is at goal. Will confirm with pm check. CBC within normal limits.   Goal of Therapy:  Heparin level 0.3-0.7 units/ml Monitor platelets by anticoagulation protocol: Yes   Plan:  Continue heparin at 1400 units/hr Check heparin level this evening then daily  Sheppard Coil PharmD., BCPS Clinical Pharmacist 04/28/2023 11:06 AM

## 2023-04-28 NOTE — Progress Notes (Signed)
2D Echocardiogram has been performed.  Maren Reamer 04/28/2023, 9:26 AM

## 2023-04-28 NOTE — Consult Note (Addendum)
Cardiology Consultation   Patient ID: Derrick Gaines MRN: [id number]; DOB: 1956/09/14  Admit date: 04/27/2023 Date of Consult: 04/28/2023  PCP:  Benetta Spar, MD   [facility] Providers Cardiologist: New to [facility]  Patient Profile:   Derrick Gaines is a 67 y.o. 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 Wendall Stade.  History of Present Illness:   [name] presented to [facility] ED on 04/25/2023 for evaluation of elevated BP in the setting of having not taken his medications for one week. He was discharged home and informed to be compliant with his medications. Presented back to the ED on 04/27/2023 for evaluation of continued elevated BP and family members voicing concerns about poor hygiene as he had not showered in 1 week. His wife was concerned about alcohol consumption or drug use.    BP was significantly elevated at 184/110 upon arrival. Initial labs showed WBC 6.8, Hgb 15.1, platelets 406, Na+ 134, K+ 2.7 and creatinine 1.49 (close to baseline of 1.4 - 1.5). Mg 2.1. Ethanol < 10. UDS negative. Hs Troponin values have been elevated at 800, 702 and 671. CXR showing no acute cardiopulmonary abnormalities. CT Head with no acute intracranial abnormalities. EKG shows sinus tachycardia, HR 102 with PAC's, LVH and slight ST depression along the inferior leads. Elevated troponin values were reviewed with Dr. Anne Fu who was on call and it was felt this was likely due to his uncontrolled HTN. Admission at [facility] was recommended with plans to start IV Heparin, obtain an echo and Cardiology to evaluate in the morning.   He received IV Labetalol 10mg  while in the ED and was restarted on Amlodipine 10mg  daily, HCTZ 12.5mg  daily and Losartan 25mg  daily (received 100mg  on the day of admission). BP improved to 122/81 on most recent  check.   In talking with the patient today, he reports that he has overall been feeling well despite his elevated BP at home. Says he quit taking his medications as he just became tired of taking so many medications but was overall tolerating well. Says that he was actually felt better on medical therapy. In regards to having not taken a shower for 1 week, he reports he was just "lazy". He says that he is usually active around the house and was previously push mowing the yard and denies any recent chest pain or dyspnea on exertion with this. No specific palpitations, orthopnea, PND or pitting edema. He is unaware of any prior cardiac issues for himself or family history of cardiac issues. He is very anxious to leave the hospital at this time as he says he cannot stay here much longer.  Past Medical History:  Diagnosis Date   Arthritis    Cancer (HCC)    Chronic kidney disease    Hypertension    Stroke (HCC)    mild left sided weakness   Vocal cord polyp     Past Surgical History:  Procedure Laterality Date   BACK SURGERY     MICROLARYNGOSCOPY N/A 08/29/2018   Procedure: DIRECT MICROLARYNGOSCOPY WITH EXCISION OF LARYNGEAL MASS;  Surgeon: Newman Pies, MD;  Location: [facility];  Service: ENT;  Laterality: N/A;   STERNOTOMY     THYMECTOMY       Home Medications:  Prior to Admission medications  Medication Sig Start Date End Date Taking? Authorizing Provider  amLODipine (NORVASC) 5 MG tablet Take 5 mg by mouth daily. 04/15/23  Yes [provider]  DULoxetine (CYMBALTA) 30 MG capsule Take 30 mg by mouth 2 (two) times daily. 04/15/23  Yes [provider]  losartan-hydrochlorothiazide (HYZAAR) 100-12.5 MG tablet Take 1 tablet by mouth daily. 04/15/23  Yes [provider]  IBU 800 MG tablet Take 800 mg by mouth 2 (two) times daily. 08/22/20   [provider]    Inpatient Medications: Scheduled Meds:  amLODipine  10 mg Oral Daily   aspirin EC  81  mg Oral Daily   atorvastatin  40 mg Oral Daily   hydrochlorothiazide  12.5 mg Oral Daily   losartan  25 mg Oral Daily   potassium chloride  40 mEq Oral Once   Continuous Infusions:  heparin 1,400 Units/hr (04/28/23 0551)   PRN Meds: acetaminophen **OR** acetaminophen, polyethylene glycol  Allergies:   No Known Allergies  Social History:   Social History   Socioeconomic History   Marital status: Married    Spouse name: Not on file   Number of children: Not on file   Years of education: Not on file   Highest education level: Not on file  Occupational History   Not on file  Tobacco Use   Smoking status: Every Day    Packs/day: 0.50    Years: 30.00    Additional pack years: 0.00    Total pack years: 15.00    Types: Cigarettes   Smokeless tobacco: Never  Vaping Use   Vaping Use: Never used  Substance and Sexual Activity   Alcohol use: No   Drug use: No   Sexual activity: Yes  Other Topics Concern   Not on file  Social History Narrative   Been on disability since age 34 due to a stroke.    Used to work at [employer].    Married. No children. Married for 15 years.   Smoke cigarettes, since age 77 years.    No alcohol. No drugs.   Enjoys fishing.    Attends church.   Eats all foods.   Wear seatbelt.    Drives.    Social Determinants of Health   Financial Resource Strain: Not on file  Food Insecurity: No Food Insecurity (04/27/2023)   Hunger Vital Sign    Worried About Running Out of Food in the Last Year: Never true    Ran Out of Food in the Last Year: Never true  Transportation Needs: No Transportation Needs (04/27/2023)   PRAPARE - Administrator, Civil Service (Medical): No    Lack of Transportation (Non-Medical): No  Physical Activity: Not on file  Stress: Not on file  Social Connections: Not on file  Intimate Partner Violence: Not At Risk (04/27/2023)   Humiliation, Afraid, Rape, and Kick questionnaire    Fear of Current or Ex-Partner: No     Emotionally Abused: No    Physically Abused: No    Sexually Abused: No    Family History:    Family History  Problem Relation Age of Onset   Diabetes Mother    Cancer Mother    Colon cancer Sister    Pneumonia Father    Emphysema Brother      ROS:  Please see the history of present illness.   All other ROS reviewed and negative.     Physical Exam/Data:   Vitals:   04/27/23 2200 04/27/23 2222 04/28/23  0152 04/28/23 0545  BP:  115/63 125/85 122/81  Pulse:  74 64 68  Resp:  18 (!) 22 19  Temp:  98.5 F (36.9 C) 97.8 F (36.6 C) 98.8 F (37.1 C)  TempSrc:      SpO2:  98% 99% 100%  Weight: 103.1 kg     Height: 6\' 1"  (1.854 m)       Intake/Output Summary (Last 24 hours) at 04/28/2023 0850 Last data filed at 04/28/2023 0600 Gross per 24 hour  Intake 172.77 ml  Output --  Net 172.77 ml      04/27/2023   10:00 PM 04/27/2023    5:29 PM 04/25/2023    4:15 PM  Last 3 Weights  Weight (lbs) 227 lb 4.7 oz 260 lb 275 lb  Weight (kg) 103.1 kg 117.935 kg 124.739 kg     Body mass index is 29.99 kg/m.  General:  Well nourished, well developed male appearing in no acute distress. HEENT: normal Neck: no JVD Vascular: No carotid bruits; Distal pulses 2+ bilaterally Cardiac:  normal S1, S2; RRR; no murmur  Lungs:  clear to auscultation bilaterally, no wheezing, rhonchi or rales  Abd: soft, nontender, no hepatomegaly  Ext: no pitting edema Musculoskeletal:  No deformities, BUE and BLE strength normal and equal Skin: warm and dry  Neuro:  CNs 2-12 intact, no focal abnormalities noted Psych:  Normal affect   EKG:  The EKG was personally reviewed and demonstrates: Sinus tachycardia, HR 102 with PAC's, LVH and slight ST depression along the inferior leads  Telemetry:  Telemetry was personally reviewed and demonstrates: Normal sinus rhythm, heart rate 60's to 70's with PAC's.  Relevant CV Studies:  Echocardiogram: Pending  Laboratory Data:  High Sensitivity Troponin:   Recent  Labs  Lab 04/27/23 1801 04/27/23 1957 04/28/23 0154  TROPONINIHS 800* 702* 671*     Chemistry Recent Labs  Lab 04/25/23 1636 04/27/23 1801 04/28/23 0154  NA 134* 134* 136  K 3.4* 2.7* 3.4*  CL 99 98 102  CO2 26 26 28   GLUCOSE 111* 113* 110*  BUN 16 17 16   CREATININE 1.47* 1.49* 1.38*  CALCIUM 9.0 8.8* 8.4*  MG  --  2.1  --   GFRNONAA 53* 52* 57*  ANIONGAP 9 10 6     Recent Labs  Lab 04/27/23 1801  PROT 7.9  ALBUMIN 3.7  AST 17  ALT 11  ALKPHOS 63  BILITOT 0.8   Lipids  Recent Labs  Lab 04/28/23 0154  CHOL 118  TRIG 151*  HDL 31*  LDLCALC 57  CHOLHDL 3.8    Hematology Recent Labs  Lab 04/25/23 1636 04/27/23 1801 04/28/23 0154  WBC 6.4 6.8 6.7  RBC 4.73 4.72 4.32  HGB 15.4 15.1 14.0  HCT 46.2 45.8 42.5  MCV 97.7 97.0 98.4  MCH 32.6 32.0 32.4  MCHC 33.3 33.0 32.9  RDW 12.5 12.6 12.7  PLT 398 406* 334   Thyroid No results for input(s): "TSH", "FREET4" in the last 168 hours.  BNPNo results for input(s): "BNP", "PROBNP" in the last 168 hours.  DDimer No results for input(s): "DDIMER" in the last 168 hours.   Radiology/Studies:  CT Head Wo Contrast  Result Date: 04/27/2023 CLINICAL DATA:  Hypertension EXAM: CT HEAD WITHOUT CONTRAST TECHNIQUE: Contiguous axial images were obtained from the base of the skull through the vertex without intravenous contrast. RADIATION DOSE REDUCTION: This exam was performed according to the departmental dose-optimization program which includes automated exposure control, adjustment of the mA  and/or kV according to patient size and/or use of iterative reconstruction technique. COMPARISON:  02/12/2012 FINDINGS: Brain: No evidence of acute infarction, hemorrhage, mass, mass effect, or midline shift. No hydrocephalus or extra-axial fluid collection. Encephalomalacia in right basal ganglia from remote infarct. Periventricular white matter changes, right greater than left likely the sequela of chronic small vessel ischemic disease.  Vascular: No hyperdense vessel. Skull: Negative for acute fracture or focal lesion. Redemonstrated remote fracture of the left parietal and temporal calvarium. Remote left frontal burr hole. Sinuses/Orbits: Mucosal thickening in the right frontal sinus and anterior ethmoid air cells. No acute finding in the imaged orbits. Other: The mastoid air cells are well aerated. IMPRESSION: No acute intracranial process. Electronically Signed   By: Wiliam Ke M.D.   On: 04/27/2023 21:51   DG Chest Port 1 View  Result Date: 04/27/2023 CLINICAL DATA:  Hypertension EXAM: PORTABLE CHEST 1 VIEW COMPARISON:  04/15/2023 x-ray and older. FINDINGS: Status post median sternotomy. Normal cardiopericardial silhouette. Decreased inflation from previous. No consolidation, pneumothorax or effusion. No edema. Lordotic x-ray. Overlapping cardiac leads. IMPRESSION: No acute cardiopulmonary disease when adjusting for level of inflation Electronically Signed   By: Karen Kays M.D.   On: 04/27/2023 18:09     Assessment and Plan:   1. Elevated Troponin Values - Hs Troponin values have been elevated to 800, 702 and 671 in the setting of hypertensive urgency and likely due to demand ischemia. EKG shows sinus tachycardia, HR 102 with PAC's, LVH and slight ST depression along the inferior leads. Will recheck a 12-Lead EKG this morning as telemetry shows more prominent ST changes but possibly due to LVH with repol as noted on prior tracings.  - Echocardiogram is pending to assess for any structural abnormalities. If this shows abnormalities, he would benefit from a cardiac catheterization for definitive evaluation but I am not sure he would be in agreement for transfer and a procedure. I am concerned that he will leave AMA as he is very anxious to go home at this time. I will reach out to the echocardiogram tech to see if this can be obtained sooner rather than later. Continue with IV Heparin for now. Also remains on ASA 81 mg daily and  Atorvastatin 40 mg daily.    2. Hypertensive Urgency - Occurred in the setting of medication noncompliance for the past week. He has been restarted on Amlodipine 10 mg daily, HCTZ 12.5 mg daily and Losartan 25 mg daily with BP improved to 122/81 on most recent check.    3. Stage 3 CKD - Baseline creatinine 1.4 - 1.5.  At 1.49 on admission and improved to 1.38 today. Followed by Dr. Wolfgang Phoenix as an outpatient.   4. Hypokalemia - K+ was at 2.7 yesterday and did improve to 3.4 by recheck. Will order additional supplementation.   5. History of CVA - Head CT this admission without acute findings. Remains on ASA and statin therapy.   6. Tobacco Use - He does smoke 0.5 ppd. Cessation advised.   For questions or updates, please contact [facility] Please consult [email or web address] for contact info under    Signed, Ellsworth Lennox, PA-C  04/28/2023 8:50 AM  Attending note Patient seen and discussed with PA Iran Ouch, I agree with her documentation. 67 yo male history of HTN, CKD3, prior CVA, admitted with HTN   WBC 6.4 Hgb 15.4 Plt 398 WBC 6.4 Na 134 K 3.4 Cr 1.47 GFR 53 UDS neg EtOH <10  Trop 800-->702-->671 EKG SR,  no acute ischemic changes CXR no acute process CT head negative   1.Elevated troponin - difficult clinical evaluation for elevated troponin in absence of a clinical context.  - no cardiopulmonary symptoms, EKG without acute ischemic changes. Echo normal LVEF without wall motion abnormalities - on presentation bps 180s/110s, I suspect trop elevation may be bp related given absence of symptoms and absence of other signs of acute ischemia thus trop. Trop peak 800 trending down.  - tele single lead with suggested TWIs this AM, f/u 12 lead EKG  - I suspect troponin is related to HTN. Most likely would plan out outpatient lexiscan. He does have some suggested new ST/T changes on telemetry, f/u 12 lead this AM. If marked changes may need to consider inpatient ischemic  testing. Unclear if he would be willing to stay additional day for this as he is very set on going home today.  - medication compliance has been an issue at home, also take into context if were to consider cath if he would be compliant with DAPT   2.HTN - noncompliant with home meds - back on norvasc 10, hctz 12.5, losartan 25.  - bp's back to goal  Dina Rich MD

## 2023-04-28 NOTE — TOC Progression Note (Signed)
  Transition of Care [facility]) Screening Note   Patient Details  Name: Derrick Gaines Date of Birth: 04-18-1956   Transition of Care [facility]) CM/SW Contact:    Leitha Bleak, RN Phone Number: 04/28/2023, 12:55 PM    Transition of Care Department [facility]) has reviewed patient and no TOC needs have been identified at this time. We will continue to monitor patient advancement through interdisciplinary progression rounds. If new patient transition needs arise, please place a TOC consult.    Expected Discharge Plan: Home/Self Care Barriers to Discharge: Continued Medical Work up  Expected Discharge Plan and Services      Social Determinants of Health (SDOH) Interventions SDOH Screenings   Food Insecurity: No Food Insecurity (04/27/2023)  Housing: Low Risk  (04/27/2023)  Transportation Needs: No Transportation Needs (04/27/2023)  Utilities: Not At Risk (04/27/2023)  Tobacco Use: High Risk (04/27/2023)    Readmission Risk Interventions     No data to display

## 2023-04-28 NOTE — Discharge Summary (Addendum)
Physician Discharge Summary  Derrick Gaines ZOX:[id number] DOB: Nov 07, 1956 DOA: 04/27/2023  PCP: Benetta Spar, MD  Admit date: 04/27/2023  Discharge date: 04/28/2023  Admitted From: Home  Disposition:  Home  Recommendations for Outpatient Follow-up:  Follow up with PCP in 1-2 weeks. Please obtain BMP/CBC in one week Advised to follow with Cardiology for outpatient Lexiscan stress test. Patient is being discharged home on blood pressure medications.   Patient is counseled about the importance of taking medications for blood pressure.  Home Health: None Equipment/Devices:None.  Discharge Condition: Stable CODE STATUS:Full code Diet recommendation: Heart Healthy   Brief [facility] Course: This 67 yrs old male with medical history significant for hypertension, CKD 3a, CVA.  Patient was brought to the ED by patient's wife with reports of elevated blood pressure.  Patient's spouse reports that patient has not been taking his medication for several years, she would give it to him and she would find it is someplace else in the house, very rarely would he take it, he last took it 2 days ago.  Patient's spouse reported that over the past year, patient is not able to hold a conversation like he normally would.  Patient's mother told her that patient was repeating the same statements over and over again, she is unable to tell me when this happened.  Also patient has not had a bath in the past 1 week.  Spouse reports she has occasionally smelled alcohol on patient, after he hangs out with his friends, friend also reported that patient uses weed.  Reports sometimes at night when he is standing his right hand would shake.  Wife insists something is wrong.  Patient tells me he is fine.  Patient was admitted for further evaluation.  He was found to have elevated troponin.  Patient denies any chest pain,  shortness of breath,  palpitations dizziness.  Patient was started on IV heparin.   Cardiology was consulted. Elevation of troponin due to demand ischemia in the setting of hypertensive urgency.  Troponin trended down, EKG without specific changes.  Cardiology recommended outpatient stress test.  Patient is started on blood pressure medications,  advised to continue to take that.  Patient understood and patient is being discharged home.   Discharge Diagnoses:  Principal Problem:   Elevated troponin Active Problems:   Essential hypertension   Abnormal behavior   CVA (cerebral vascular accident) (HCC)   Tobacco abuse   High cholesterol   Hypokalemia   CKD (chronic kidney disease) stage 3, GFR 30-59 ml/min (HCC)  Elevated troponin Troponin 800 > 702.  No cardiac history.   EKG sinus tachycardia, rare PVC, no significant ST or T wave changes from prior.   He denies chest pain, difficulty breathing or any symptoms referable to CAD at this time. EDP talked to Dr. Trudi Ida demand ischemia from uncontrolled hypertension, recommended trending troponin, control blood pressure, okay to stay at [facility] cardiology team can see him in the morning Cardiogram unremarkable, LVEF 55 to 60%. Cardiology consulted in the am. Initiated on IV heparin.  Heparin discontinued by cardiology Continue aspirin and Lipitor. Cardiology signed off,  recommended patient can be discharged and outpatient follow-up for Lexiscan stress test.  Essential hypertension Uncontrolled hypertension due to noncompliance.  On Norvasc 5, losartan 100 HCTZ- 12.5 As he has been noncompliant, will restart losartan at reduced dose 25 mg daily, HCTZ 12.5 Blood pressure improved.  Advised to continue blood pressure medications.   Abnormal behavior Multiple complaints per spouse,-not holding  conversation like he should, repeating statements- per Mother, occasional tremor in the right upper extremity, possible alcohol use, reported marijuana use, not having his bath for 1 week.  On my evaluation, patient is A & O X  4. He denies these complaints.  Mental status appears to be normal.  Denies alcohol intake. CT Head unremarkable. -UDS- clean - Blood alcohol < 10 -Likely just follow-up as outpatient.   CKD (chronic kidney disease) stage 3, GFR 30-59 ml/min (HCC) CKD 3 A. Cr- 1.49. At baseline.    Hypokalemia K- 2.7. Mag- 2.1 Replaced  and resolved.   High cholesterol - Lipid panel AM.   - Start atorvastatin 40 mg daily.   CVA (cerebral vascular accident) (HCC) Stable.  No focal deficits.  Discharge Instructions  Discharge Instructions     Call MD for:  persistant dizziness or light-headedness   Complete by: As directed    Call MD for:  persistant nausea and vomiting   Complete by: As directed    Call MD for:  redness, tenderness, or signs of infection (pain, swelling, redness, odor or green/yellow discharge around incision site)   Complete by: As directed    Diet - low sodium heart healthy   Complete by: As directed    Diet Carb Modified   Complete by: As directed    Discharge instructions   Complete by: As directed    Advised to follow-up with primary care physician in 1 week. Advised to follow with cardiology for outpatient Lexiscan stress test. Patient is being discharged home on blood pressure medications.   Patient is counseled about the importance of taking medications for blood pressure.   Increase activity slowly   Complete by: As directed       Allergies as of 04/28/2023   No Known Allergies      Medication List     STOP taking these medications    DULoxetine 30 MG capsule Commonly known as: CYMBALTA   IBU 800 MG tablet Generic drug: ibuprofen   losartan-hydrochlorothiazide 100-12.5 MG tablet Commonly known as: HYZAAR       TAKE these medications    amLODipine 10 MG tablet Commonly known as: NORVASC Take 1 tablet (10 mg total) by mouth daily. Start taking on: Apr 29, 2023 What changed:  medication strength how much to take   aspirin EC 81 MG  tablet Take 1 tablet (81 mg total) by mouth daily. Swallow whole. Start taking on: Apr 29, 2023   atorvastatin 40 MG tablet Commonly known as: LIPITOR Take 1 tablet (40 mg total) by mouth daily. Start taking on: Apr 29, 2023   hydrochlorothiazide 12.5 MG tablet Commonly known as: HYDRODIURIL Take 1 tablet (12.5 mg total) by mouth daily. Start taking on: Apr 29, 2023   losartan 25 MG tablet Commonly known as: COZAAR Take 1 tablet (25 mg total) by mouth daily. Start taking on: Apr 29, 2023        Follow-up Information     Fanta, Wayland Salinas, MD Follow up in 1 week(s).   Specialty: Internal Medicine Contact information: [address] [place] [address] [phone]         Antoine Poche, MD Follow up in 1 week(s).   Specialty: Cardiology Contact information: [address] [place] [address] [phone]                No Known Allergies  Consultations: Cardiology   Procedures/Studies: ECHOCARDIOGRAM COMPLETE  Result Date: 04/28/2023    ECHOCARDIOGRAM  REPORT   Patient Name:   AAHIL FREDIN Date of Exam: 04/28/2023 Medical Rec #:  [id number]     Height:       73.0 in Accession #:    [id number]    Weight:       227.3 lb Date of Birth:  April 19, 1956     BSA:          2.272 m Patient Age:    66 years      BP:           122/81 mmHg Patient Gender: M             HR:           60 bpm. [facility] Procedure: 2D Echo, Cardiac Doppler, Color Doppler and Intracardiac            Opacification Agent Indications:    Elevated Troponin  History:        Patient has no prior history of Echocardiogram examinations.                 CVA; Risk Factors:Hypertension, Diabetes, Current Smoker and                 Dyslipidemia.  Sonographer:    Aron Baba Referring Phys: [id number] Onnie Boer  Sonographer Comments: Technically difficult study due to poor echo windows and patient is obese. Image acquisition challenging due to respiratory motion.  IMPRESSIONS  1. Left ventricular ejection fraction, by estimation, is 55 to 60%. The left ventricle has normal function. The left ventricle has no regional wall motion abnormalities. Left ventricular diastolic parameters are consistent with Grade I diastolic dysfunction (impaired relaxation).  2. Right ventricular systolic function is normal. The right ventricular size is normal. Tricuspid regurgitation signal is inadequate for assessing PA pressure.  3. The mitral valve was not well visualized. No evidence of mitral valve regurgitation. No evidence of mitral stenosis.  4. The aortic valve was not well visualized. Aortic valve regurgitation is not visualized. No aortic stenosis is present.  5. Aortic dilatation noted. There is mild dilatation of the ascending aorta, measuring 36 mm.  6. The inferior vena cava is normal in size with <50% respiratory variability, suggesting right atrial pressure of 8 mmHg. FINDINGS  Left Ventricle: Left ventricular ejection fraction, by estimation, is 55 to 60%. The left ventricle has normal function. The left ventricle has no regional wall motion abnormalities. Definity contrast agent was given IV to delineate the left ventricular  endocardial borders. The left ventricular internal cavity size was normal in size. There is no left ventricular hypertrophy. Left ventricular diastolic parameters are consistent with Grade I diastolic dysfunction (impaired relaxation). Normal left ventricular filling pressure. Right Ventricle: The right ventricular size is normal. Right vetricular wall thickness was not well visualized. Right ventricular systolic function is normal. Tricuspid regurgitation signal is inadequate for assessing PA pressure. Left Atrium: Left atrial size was normal in size. Right Atrium: Right atrial size was normal in size. Pericardium: There is no evidence of pericardial effusion. Mitral Valve: The mitral valve was not well visualized. No evidence of mitral valve  regurgitation. No evidence of mitral valve stenosis. Tricuspid Valve: The tricuspid valve is not well visualized. Tricuspid valve regurgitation is not demonstrated. No evidence of tricuspid stenosis. Aortic Valve: The aortic valve was not well visualized. Aortic valve regurgitation is not visualized. No aortic stenosis is present. Aortic valve mean gradient measures 2.1 mmHg. Aortic valve peak gradient measures 5.1  mmHg. Aortic valve area, by VTI measures 2.27 cm. Pulmonic Valve: The pulmonic valve was not well visualized. Pulmonic valve regurgitation is not visualized. No evidence of pulmonic stenosis. Aorta: The aortic root is normal in size and structure and aortic dilatation noted. There is mild dilatation of the ascending aorta, measuring 36 mm. Venous: The inferior vena cava is normal in size with less than 50% respiratory variability, suggesting right atrial pressure of 8 mmHg. IAS/Shunts: No atrial level shunt detected by color flow Doppler.  LEFT VENTRICLE PLAX 2D LVIDd:         4.80 cm   Diastology LVIDs:         3.80 cm   LV e' medial:    5.84 cm/s LV PW:         0.90 cm   LV E/e' medial:  9.1 LV IVS:        0.70 cm   LV e' lateral:   8.19 cm/s LVOT diam:     2.00 cm   LV E/e' lateral: 6.5 LV SV:         44 LV SV Index:   19 LVOT Area:     3.14 cm  RIGHT VENTRICLE RV S prime:     9.16 cm/s TAPSE (M-mode): 2.4 cm LEFT ATRIUM             Index        RIGHT ATRIUM           Index LA diam:        3.90 cm 1.72 cm/m   RA Area:     18.40 cm LA Vol (A2C):   66.1 ml 29.10 ml/m  RA Volume:   53.30 ml  23.46 ml/m LA Vol (A4C):   58.7 ml 25.84 ml/m LA Biplane Vol: 65.4 ml 28.79 ml/m  AORTIC VALVE AV Area (Vmax):    2.43 cm AV Area (Vmean):   2.45 cm AV Area (VTI):     2.27 cm AV Vmax:           112.44 cm/s AV Vmean:          65.306 cm/s AV VTI:            0.195 m AV Peak Grad:      5.1 mmHg AV Mean Grad:      2.1 mmHg LVOT Vmax:         87.10 cm/s LVOT Vmean:        51.000 cm/s LVOT VTI:          0.141 m  LVOT/AV VTI ratio: 0.72  AORTA Ao Root diam: 3.70 cm Ao Asc diam:  3.60 cm MITRAL VALVE MV Area (PHT): 2.83 cm    SHUNTS MV Decel Time: 268 msec    Systemic VTI:  0.14 m MR Peak grad: 1.9 mmHg     Systemic Diam: 2.00 cm MR Vmax:      69.20 cm/s MV E velocity: 53.40 cm/s MV A velocity: 85.40 cm/s MV E/A ratio:  0.63 Dina Rich MD Electronically signed by Dina Rich MD Signature Date/Time: 04/28/2023/9:35:40 AM    Final    CT Head Wo Contrast  Result Date: 04/27/2023 CLINICAL DATA:  Hypertension EXAM: CT HEAD WITHOUT CONTRAST TECHNIQUE: Contiguous axial images were obtained from the base of the skull through the vertex without intravenous contrast. RADIATION DOSE REDUCTION: This exam was performed according to the departmental dose-optimization program which includes automated exposure control, adjustment of the mA and/or kV according to patient size  and/or use of iterative reconstruction technique. COMPARISON:  02/12/2012 FINDINGS: Brain: No evidence of acute infarction, hemorrhage, mass, mass effect, or midline shift. No hydrocephalus or extra-axial fluid collection. Encephalomalacia in right basal ganglia from remote infarct. Periventricular white matter changes, right greater than left likely the sequela of chronic small vessel ischemic disease. Vascular: No hyperdense vessel. Skull: Negative for acute fracture or focal lesion. Redemonstrated remote fracture of the left parietal and temporal calvarium. Remote left frontal burr hole. Sinuses/Orbits: Mucosal thickening in the right frontal sinus and anterior ethmoid air cells. No acute finding in the imaged orbits. Other: The mastoid air cells are well aerated. IMPRESSION: No acute intracranial process. Electronically Signed   By: Wiliam Ke M.D.   On: 04/27/2023 21:51   DG Chest Port 1 View  Result Date: 04/27/2023 CLINICAL DATA:  Hypertension EXAM: PORTABLE CHEST 1 VIEW COMPARISON:  04/15/2023 x-ray and older. FINDINGS: Status post median  sternotomy. Normal cardiopericardial silhouette. Decreased inflation from previous. No consolidation, pneumothorax or effusion. No edema. Lordotic x-ray. Overlapping cardiac leads. IMPRESSION: No acute cardiopulmonary disease when adjusting for level of inflation Electronically Signed   By: Karen Kays M.D.   On: 04/27/2023 18:09   DG Chest 2 View  Result Date: 04/16/2023 CLINICAL DATA:  Cough and congestion EXAM: CHEST - 2 VIEW COMPARISON:  11/20/2020 FINDINGS: Normal heart size post median sternotomy. Mediastinal contours and pulmonary vascularity normal. Chronic accentuation of perihilar markings similar to previous exam question chronic bronchitis. No acute infiltrate, pleural effusion, or pneumothorax. Osseous structures unremarkable. IMPRESSION: Probable chronic bronchitic changes without acute abnormality. Electronically Signed   By: Ulyses Southward M.D.   On: 04/16/2023 13:15     Subjective: Patient was seen and examined at bedside.  Overnight events noted.   Patient report doing much better and wants to be discharged.  Denies any chest pain.   Emphasized about blood pressure medication compliance.  Discharge Exam: Vitals:   04/28/23 0545 04/28/23 1300  BP: 122/81 (!) 158/98  Pulse: 68 79  Resp: 19 19  Temp: 98.8 F (37.1 C) 97.8 F (36.6 C)  SpO2: 100% 96%   Vitals:   04/27/23 2222 04/28/23 0152 04/28/23 0545 04/28/23 1300  BP: 115/63 125/85 122/81 (!) 158/98  Pulse: 74 64 68 79  Resp: 18 (!) 22 19 19   Temp: 98.5 F (36.9 C) 97.8 F (36.6 C) 98.8 F (37.1 C) 97.8 F (36.6 C)  TempSrc:      SpO2: 98% 99% 100% 96%  Weight:      Height:        General: Pt is alert, awake, not in acute distress Cardiovascular: RRR, S1/S2 +, no rubs, no gallops Respiratory: CTA bilaterally, no wheezing, no rhonchi Abdominal: Soft, NT, ND, bowel sounds + Extremities: no edema, no cyanosis    The results of significant diagnostics from this hospitalization (including imaging,  microbiology, ancillary and laboratory) are listed below for reference.     Microbiology: No results found for this or any previous visit (from the past 240 hour(s)).   Labs: BNP (last 3 results) No results for input(s): "BNP" in the last 8760 hours. Basic Metabolic Panel: Recent Labs  Lab 04/25/23 1636 04/27/23 1801 04/28/23 0154  NA 134* 134* 136  K 3.4* 2.7* 3.4*  CL 99 98 102  CO2 26 26 28   GLUCOSE 111* 113* 110*  BUN 16 17 16   CREATININE 1.47* 1.49* 1.38*  CALCIUM 9.0 8.8* 8.4*  MG  --  2.1  --  Liver Function Tests: Recent Labs  Lab 04/27/23 1801  AST 17  ALT 11  ALKPHOS 63  BILITOT 0.8  PROT 7.9  ALBUMIN 3.7   No results for input(s): "LIPASE", "AMYLASE" in the last 168 hours. No results for input(s): "AMMONIA" in the last 168 hours. CBC: Recent Labs  Lab 04/25/23 1636 04/27/23 1801 04/28/23 0154  WBC 6.4 6.8 6.7  NEUTROABS 3.5  --   --   HGB 15.4 15.1 14.0  HCT 46.2 45.8 42.5  MCV 97.7 97.0 98.4  PLT 398 406* 334   Cardiac Enzymes: No results for input(s): "CKTOTAL", "CKMB", "CKMBINDEX", "TROPONINI" in the last 168 hours. BNP: Invalid input(s): "POCBNP" CBG: No results for input(s): "GLUCAP" in the last 168 hours. D-Dimer No results for input(s): "DDIMER" in the last 72 hours. Hgb A1c No results for input(s): "HGBA1C" in the last 72 hours. Lipid Profile Recent Labs    04/28/23 0154  CHOL 118  HDL 31*  LDLCALC 57  TRIG 960*  CHOLHDL 3.8   Thyroid function studies No results for input(s): "TSH", "T4TOTAL", "T3FREE", "THYROIDAB" in the last 72 hours.  Invalid input(s): "FREET3" Anemia work up No results for input(s): "VITAMINB12", "FOLATE", "FERRITIN", "TIBC", "IRON", "RETICCTPCT" in the last 72 hours. Urinalysis    Component Value Date/Time   COLORURINE YELLOW 11/21/2020 0006   APPEARANCEUR CLEAR 11/21/2020 0006   LABSPEC 1.014 11/21/2020 0006   PHURINE 5.0 11/21/2020 0006   GLUCOSEU NEGATIVE 11/21/2020 0006   HGBUR SMALL  (A) 11/21/2020 0006   BILIRUBINUR NEGATIVE 11/21/2020 0006   KETONESUR NEGATIVE 11/21/2020 0006   PROTEINUR 30 (A) 11/21/2020 0006   NITRITE NEGATIVE 11/21/2020 0006   LEUKOCYTESUR NEGATIVE 11/21/2020 0006   Sepsis Labs Recent Labs  Lab 04/25/23 1636 04/27/23 1801 04/28/23 0154  WBC 6.4 6.8 6.7   Microbiology No results found for this or any previous visit (from the past 240 hour(s)).   Time coordinating discharge: Over 30 minutes  SIGNED:   Willeen Niece, MD  [facility] 04/28/2023, 2:34 PM Pager   If 7PM-7AM, please contact night-coverage

## 2023-05-06 ENCOUNTER — Encounter (HOSPITAL_COMMUNITY): Payer: Medicare PPO

## 2023-05-06 ENCOUNTER — Encounter (HOSPITAL_COMMUNITY): Admission: RE | Admit: 2023-05-06 | Payer: Medicare PPO | Source: Ambulatory Visit

## 2023-05-10 ENCOUNTER — Encounter (HOSPITAL_COMMUNITY): Payer: Medicare PPO

## 2023-05-10 DIAGNOSIS — F411 Generalized anxiety disorder: Secondary | ICD-10-CM | POA: Diagnosis not present

## 2023-05-10 DIAGNOSIS — F17200 Nicotine dependence, unspecified, uncomplicated: Secondary | ICD-10-CM | POA: Diagnosis not present

## 2023-05-10 DIAGNOSIS — Z91148 Patient's other noncompliance with medication regimen for other reason: Secondary | ICD-10-CM | POA: Diagnosis not present

## 2023-05-10 DIAGNOSIS — I10 Essential (primary) hypertension: Secondary | ICD-10-CM | POA: Diagnosis not present

## 2023-05-12 DIAGNOSIS — M1990 Unspecified osteoarthritis, unspecified site: Secondary | ICD-10-CM | POA: Diagnosis not present

## 2023-05-12 DIAGNOSIS — Z0001 Encounter for general adult medical examination with abnormal findings: Secondary | ICD-10-CM | POA: Diagnosis not present

## 2023-05-12 DIAGNOSIS — I10 Essential (primary) hypertension: Secondary | ICD-10-CM | POA: Diagnosis not present

## 2023-05-13 ENCOUNTER — Encounter (HOSPITAL_COMMUNITY): Payer: Medicare PPO

## 2023-05-20 ENCOUNTER — Ambulatory Visit: Payer: Medicare PPO | Admitting: Medical

## 2023-06-20 ENCOUNTER — Ambulatory Visit: Payer: Medicare PPO | Attending: Medical | Admitting: Nurse Practitioner

## 2023-06-20 ENCOUNTER — Ambulatory Visit: Payer: Medicare PPO | Admitting: Nurse Practitioner

## 2023-06-20 ENCOUNTER — Encounter: Payer: Self-pay | Admitting: Nurse Practitioner

## 2023-06-20 VITALS — BP 139/80 | HR 86 | Ht 72.0 in | Wt 217.2 lb

## 2023-06-20 DIAGNOSIS — R6889 Other general symptoms and signs: Secondary | ICD-10-CM | POA: Diagnosis not present

## 2023-06-20 DIAGNOSIS — N183 Chronic kidney disease, stage 3 unspecified: Secondary | ICD-10-CM | POA: Diagnosis not present

## 2023-06-20 DIAGNOSIS — E876 Hypokalemia: Secondary | ICD-10-CM | POA: Diagnosis not present

## 2023-06-20 DIAGNOSIS — I77819 Aortic ectasia, unspecified site: Secondary | ICD-10-CM

## 2023-06-20 DIAGNOSIS — I10 Essential (primary) hypertension: Secondary | ICD-10-CM

## 2023-06-20 DIAGNOSIS — R4689 Other symptoms and signs involving appearance and behavior: Secondary | ICD-10-CM

## 2023-06-20 DIAGNOSIS — E7800 Pure hypercholesterolemia, unspecified: Secondary | ICD-10-CM

## 2023-06-20 DIAGNOSIS — I639 Cerebral infarction, unspecified: Secondary | ICD-10-CM

## 2023-06-20 DIAGNOSIS — E781 Pure hyperglyceridemia: Secondary | ICD-10-CM

## 2023-06-20 DIAGNOSIS — Z8673 Personal history of transient ischemic attack (TIA), and cerebral infarction without residual deficits: Secondary | ICD-10-CM | POA: Diagnosis not present

## 2023-06-20 DIAGNOSIS — Z720 Tobacco use: Secondary | ICD-10-CM

## 2023-06-20 MED ORDER — ASPIRIN 81 MG PO CHEW
81.0000 mg | CHEWABLE_TABLET | Freq: Once | ORAL | Status: DC
Start: 2023-06-20 — End: 2023-10-25

## 2023-06-20 NOTE — Patient Instructions (Addendum)
Medication Instructions:  Start taking Asprin 81 Mg daily Continue all other medications as prescribed  Labwork: BMET in 1-2 weeks at [facility]  Testing/Procedures: none  Follow-Up: Your physician recommends that you schedule a follow-up appointment in: 6 months with Dr. Wyline Mood  Any Other Special Instructions Will Be Listed Below (If Applicable). Provider recommends: Daily Recks such as-Brain Puzzles, Open Blinds, Educational TV, and No Driving until seen by your family doctor.      Blood Pressure Record Sheet To take your blood pressure, you will need a blood pressure machine. You may be prescribed one, or you can buy a blood pressure machine (blood pressure monitor) at your clinic, drug store, or online. When choosing one, look for these features: An automatic monitor that has an arm cuff. A cuff that wraps snugly, but not too tightly, around your upper arm. You should be able to fit only one finger between your arm and the cuff. A device that stores blood pressure reading results. Do not choose a monitor that measures your blood pressure from your wrist or finger. Follow your health care provider's instructions for how to take your blood pressure. To use this form: Get one reading in the morning (a.m.) before you take any medicines. Get one reading in the evening (p.m.) before supper. Take at least two readings with each blood pressure check. This makes sure the results are correct. Wait 1-2 minutes between measurements. Write down the results in the spaces on this form. Repeat this once a week, or as told by your health care provider. Make a follow-up appointment with your health care provider to discuss the results. Blood pressure log Date: _______________________ a.m. _____________________(1st reading) _____________________(2nd reading) p.m. _____________________(1st reading) _____________________(2nd reading) Date: _______________________ a.m. _____________________(1st  reading) _____________________(2nd reading) p.m. _____________________(1st reading) _____________________(2nd reading) Date: _______________________ a.m. _____________________(1st reading) _____________________(2nd reading) p.m. _____________________(1st reading) _____________________(2nd reading) Date: _______________________ a.m. _____________________(1st reading) _____________________(2nd reading) p.m. _____________________(1st reading) _____________________(2nd reading) Date: _______________________ a.m. _____________________(1st reading) _____________________(2nd reading) p.m. _____________________(1st reading) _____________________(2nd reading) This information is not intended to replace advice given to you by your health care provider. Make sure you discuss any questions you have with your health care provider. Document Revised: 08/27/2021 Document Reviewed: 08/27/2021 Elsevier Patient Education  2024 [employer].   If you need a refill on your cardiac medications before your next appointment, please call your pharmacy.

## 2023-06-20 NOTE — Progress Notes (Unsigned)
Cardiology Office Note:  .   Date:  06/20/2023 ID:  Derrick Gaines, DOB [birth year], MRN [id number] PCP: Benetta Spar, MD  [facility] Providers Cardiologist:  Dina Rich, MD    History of Present Illness: Derrick Gaines is a 67 y.o. male with a PMH of hx of HTN, CKD stage 3a, past hx of CVA, aortic dilatation, hypercholesterolemia, hypertriglyceridemia, CKD stage 3, tobacco abuse, who presents today for hospital follow-up.    Hospital admission 04/25/2023 - 04/28/2023 d/t elevated blood pressure. Family reported pt not able to hold a conversation as he normally would. Spouse reported alcohol smelled on patient occasionally. Friend reported patient used weed. CT head negative for anything acute. Found to have elevated trop during admission d/t demand ischemia in setting of hypertensive urgency, trop trended down. EKG unremarkable. Cards recommended outpatient stress test. Echo showed normal EF, mildly dilated ascending aorta.   Patient presents today with his wife. Pt is a poor historian d/t forgetfulness/ possible dementia. Pt says he is doing well. Denies any chest pain, shortness of breath, palpitations, syncope, presyncope, dizziness, orthopnea, PND, swelling or significant weight changes, acute bleeding, or claudication. Wife admits to pt forgetting to bath for 2 weeks, has difficulty with recall, does admit to difficulty with ambulation. Says his hx of stroke was many years ago but recent forgetfulness is new.   SH: Smoking 1-1.5 PPD, denies any ETOH or illicit drug use  Studies Reviewed: .       Echo 04/2023:   1. Left ventricular ejection fraction, by estimation, is 55 to 60%. The  left ventricle has normal function. The left ventricle has no regional  wall motion abnormalities. Left ventricular diastolic parameters are  consistent with Grade I diastolic  dysfunction (impaired relaxation).   2. Right ventricular systolic function is normal. The right ventricular   size is normal. Tricuspid regurgitation signal is inadequate for assessing  PA pressure.   3. The mitral valve was not well visualized. No evidence of mitral valve  regurgitation. No evidence of mitral stenosis.   4. The aortic valve was not well visualized. Aortic valve regurgitation  is not visualized. No aortic stenosis is present.   5. Aortic dilatation noted. There is mild dilatation of the ascending  aorta, measuring 36 mm.   6. The inferior vena cava is normal in size with <50% respiratory  variability, suggesting right atrial pressure of 8 mmHg.     Physical Exam:   VS:  BP 139/80 (BP Location: Left Arm, Patient Position: Sitting)   Pulse 86   Ht 6' (1.829 m)   Wt 217 lb 3.2 oz (98.5 kg)   SpO2 94%   BMI 29.46 kg/m    Wt Readings from Last 3 Encounters:  06/20/23 217 lb 3.2 oz (98.5 kg)  04/27/23 227 lb 4.7 oz (103.1 kg)  04/25/23 275 lb (124.7 kg)    GEN: Well nourished, well developed in no acute distress NECK: No JVD; No carotid bruits CARDIAC: S1/S2, RRR, no murmurs, rubs, gallops RESPIRATORY:  Clear to auscultation without rales, wheezing or rhonchi  ABDOMEN: Soft, non-tender, non-distended EXTREMITIES:  No edema; No deformity   ASSESSMENT AND PLAN: .    1. HTN BP on arrival 146/88, repeat BP 139/80. SBP goal < 140. Discussed to monitor BP at home at least 2 hours after medications and sitting for 5-10 minutes. Continue losartan. If BP not at goal by next OV, consider amlodipine.   2. Hypercholesterolemia, hypertriglyceridemia LDL 57  04/2023, TG 151. Continue atorvastatin. Heart healthy diet and regular cardiovascular exercise encouraged.   3. Aortic dilatation Mild dilatation of ascending aorta noted on echo 04/2023, measuring 36 mm.  Recommend updating echocardiogram next year for monitoring.  4. Remote CVA Does admit to some chronic deficit, minor right side weakness per wife and patient's report. Recent head CT was negative for anything acute. Will add  aspirin 81 mg daily in addition to atorvastatin. Will refer to Neuro as mentioned below.   5. CKD stage 3, hypokalemia Most recent sCr was 1.38 with eGFR at 57. Will repeat BMET d/t hx of hypokalemia. No medication changes at this time. Avoid nephrotoxic agents. Continue to follow with PCP.  6. Abnormal behavior, forgetfulness   Recent new onset surrounding hospital admission. A&O with some forgetfulness noted during interview. UDS unremarkable. CT head unremarkable. Blood alcohol < 10. Concern patient is showing signs/symptoms of early stages of dementia. Will refer to neuro.   7. Tobacco abuse Smoking cessation encouraged and discussed.   Dispo: Follow-up with Dr. Dina Rich or APP in 6 months or sooner if anything changes.   Signed, Sharlene Dory, NP

## 2023-06-21 ENCOUNTER — Encounter: Payer: Self-pay | Admitting: Nurse Practitioner

## 2023-06-24 DIAGNOSIS — F330 Major depressive disorder, recurrent, mild: Secondary | ICD-10-CM | POA: Diagnosis not present

## 2023-06-24 DIAGNOSIS — I10 Essential (primary) hypertension: Secondary | ICD-10-CM | POA: Diagnosis not present

## 2023-06-24 DIAGNOSIS — Z1331 Encounter for screening for depression: Secondary | ICD-10-CM | POA: Diagnosis not present

## 2023-06-24 DIAGNOSIS — N183 Chronic kidney disease, stage 3 unspecified: Secondary | ICD-10-CM | POA: Diagnosis not present

## 2023-06-24 DIAGNOSIS — F4310 Post-traumatic stress disorder, unspecified: Secondary | ICD-10-CM | POA: Diagnosis not present

## 2023-06-24 DIAGNOSIS — F411 Generalized anxiety disorder: Secondary | ICD-10-CM | POA: Diagnosis not present

## 2023-06-24 DIAGNOSIS — Z1389 Encounter for screening for other disorder: Secondary | ICD-10-CM | POA: Diagnosis not present

## 2023-06-24 DIAGNOSIS — Z0001 Encounter for general adult medical examination with abnormal findings: Secondary | ICD-10-CM | POA: Diagnosis not present

## 2023-06-24 DIAGNOSIS — M1990 Unspecified osteoarthritis, unspecified site: Secondary | ICD-10-CM | POA: Diagnosis not present

## 2023-07-04 DIAGNOSIS — E876 Hypokalemia: Secondary | ICD-10-CM | POA: Diagnosis not present

## 2023-07-05 LAB — BASIC METABOLIC PANEL
BUN/Creatinine Ratio: 12 (ref 10–24)
BUN: 15 mg/dL (ref 8–27)
CO2: 23 mmol/L (ref 20–29)
Calcium: 9.4 mg/dL (ref 8.6–10.2)
Chloride: 100 mmol/L (ref 96–106)
Creatinine, Ser: 1.25 mg/dL (ref 0.76–1.27)
Glucose: 107 mg/dL — ABNORMAL HIGH (ref 70–99)
Potassium: 3.9 mmol/L (ref 3.5–5.2)
Sodium: 138 mmol/L (ref 134–144)
eGFR: 64 mL/min/{1.73_m2} (ref >59)

## 2023-07-06 DIAGNOSIS — G8192 Hemiplegia, unspecified affecting left dominant side: Secondary | ICD-10-CM | POA: Diagnosis not present

## 2023-07-06 DIAGNOSIS — I10 Essential (primary) hypertension: Secondary | ICD-10-CM | POA: Diagnosis not present

## 2023-07-06 DIAGNOSIS — M1990 Unspecified osteoarthritis, unspecified site: Secondary | ICD-10-CM | POA: Diagnosis not present

## 2023-07-06 DIAGNOSIS — N183 Chronic kidney disease, stage 3 unspecified: Secondary | ICD-10-CM | POA: Diagnosis not present

## 2023-07-06 DIAGNOSIS — F330 Major depressive disorder, recurrent, mild: Secondary | ICD-10-CM | POA: Diagnosis not present

## 2023-08-25 ENCOUNTER — Encounter: Payer: Self-pay | Admitting: Internal Medicine

## 2023-08-25 ENCOUNTER — Ambulatory Visit (INDEPENDENT_AMBULATORY_CARE_PROVIDER_SITE_OTHER): Payer: Medicare PPO | Admitting: Internal Medicine

## 2023-08-25 VITALS — BP 133/81 | HR 82 | Resp 16 | Ht 72.0 in | Wt 219.0 lb

## 2023-08-25 DIAGNOSIS — Z8673 Personal history of transient ischemic attack (TIA), and cerebral infarction without residual deficits: Secondary | ICD-10-CM | POA: Diagnosis not present

## 2023-08-25 DIAGNOSIS — E559 Vitamin D deficiency, unspecified: Secondary | ICD-10-CM | POA: Diagnosis not present

## 2023-08-25 DIAGNOSIS — R413 Other amnesia: Secondary | ICD-10-CM | POA: Diagnosis not present

## 2023-08-25 DIAGNOSIS — D4989 Neoplasm of unspecified behavior of other specified sites: Secondary | ICD-10-CM | POA: Diagnosis not present

## 2023-08-25 DIAGNOSIS — I10 Essential (primary) hypertension: Secondary | ICD-10-CM | POA: Diagnosis not present

## 2023-08-25 DIAGNOSIS — Z125 Encounter for screening for malignant neoplasm of prostate: Secondary | ICD-10-CM

## 2023-08-25 DIAGNOSIS — R269 Unspecified abnormalities of gait and mobility: Secondary | ICD-10-CM

## 2023-08-25 DIAGNOSIS — E782 Mixed hyperlipidemia: Secondary | ICD-10-CM | POA: Diagnosis not present

## 2023-08-25 DIAGNOSIS — N1831 Chronic kidney disease, stage 3a: Secondary | ICD-10-CM

## 2023-08-25 DIAGNOSIS — F17210 Nicotine dependence, cigarettes, uncomplicated: Secondary | ICD-10-CM

## 2023-08-25 DIAGNOSIS — Z1159 Encounter for screening for other viral diseases: Secondary | ICD-10-CM

## 2023-08-25 DIAGNOSIS — Z720 Tobacco use: Secondary | ICD-10-CM

## 2023-08-25 DIAGNOSIS — R739 Hyperglycemia, unspecified: Secondary | ICD-10-CM

## 2023-08-25 MED ORDER — ATORVASTATIN CALCIUM 40 MG PO TABS
40.0000 mg | ORAL_TABLET | Freq: Every day | ORAL | 1 refills | Status: DC
Start: 2023-08-25 — End: 2024-06-26

## 2023-08-25 MED ORDER — HYDROCHLOROTHIAZIDE 12.5 MG PO TABS
12.5000 mg | ORAL_TABLET | Freq: Every day | ORAL | 1 refills | Status: DC
Start: 2023-08-25 — End: 2024-03-05

## 2023-08-25 MED ORDER — LOSARTAN POTASSIUM 25 MG PO TABS
25.0000 mg | ORAL_TABLET | Freq: Every day | ORAL | 1 refills | Status: DC
Start: 2023-08-25 — End: 2024-03-08

## 2023-08-25 MED ORDER — AMLODIPINE BESYLATE 10 MG PO TABS
10.0000 mg | ORAL_TABLET | Freq: Every day | ORAL | 1 refills | Status: DC
Start: 2023-08-25 — End: 2023-10-25

## 2023-08-25 NOTE — Assessment & Plan Note (Signed)
 Smokes about 1-1.5 pack/day  Asked about quitting: confirms that he/she currently smokes cigarettes Advise to quit smoking: Educated about QUITTING to reduce the risk of cancer, cardio and cerebrovascular disease. Assess willingness: Unwilling to quit at this time, but is working on cutting back. Assist with counseling and pharmacotherapy: Counseled for 5 minutes and literature provided. Arrange for follow up: follow up in 3 months and continue to offer help.

## 2023-08-25 NOTE — Assessment & Plan Note (Signed)
GFR stays around 50-55 Used to be followed by Dr. Wolfgang Phoenix Needs to be on ARB, sent Losartan prescription Maintain adequate hydration

## 2023-08-25 NOTE — Assessment & Plan Note (Addendum)
Has had episodes of confusion He is relatively young for dementia, no significant family history of dementia Concern for structural abnormality-check MRI brain Considering his current mental condition, will taper and discontinue Cymbalta as there is no clear indication to continue Cymbalta

## 2023-08-25 NOTE — Assessment & Plan Note (Signed)
S/p resection, refused chemotherapy Currently on under annual CT chest surveillance - followed by Oncology

## 2023-08-25 NOTE — Progress Notes (Signed)
New Patient Office Visit  Subjective:  Patient ID: Derrick Gaines, male    DOB: 1956/02/12  Age: 66 y.o. MRN: [id number]  CC:  Chief Complaint  Patient presents with   Establish Care    HPI Derrick Gaines is a 67 y.o. male with past medical history of HTN, CVA, CKD, HLD, thymoma s/p resection and tobacco abuse who presents for establishing care.  HTN: He has history of poorly controlled hypertension due to poor compliance.  He currently takes amlodipine 10 mg QD and hydrochlorothiazide 12.5 mg QD, but does not have losartan 25 mg QD that was prescribed to him due to uncontrolled HTN and CKD.  He had hospitalization due to elevated troponin in 05/24, which was deemed to be due to demand ischemia in the setting of hypertensive urgency.  He currently denies any headache, dizziness, chest pain, dyspnea or palpitations.  History of CVA: His wife reports history of CVA, with residual mild right UE weakness.  Patient himself denies any numbness or weakness of the UE or LE.  He has memory deficits for the last few years, which has been progressing.  His wife reports an incident when patient had urinated in the [place] store and was arrested for it.  Patient does not recall the event.  He has been forgetting streets and his wife has stopped him from driving due to irrational driving.  Chart review suggest that he takes alcohol with his friends and smokes weed at times.  Denies any other illicit drug use.  His wife also reports that his gait has been disturbed, but denies urinary or stool incontinence.  CKD: Used to follow up with Dr. Wolfgang Phoenix.  He had elevated his immunoglobulin light chains and had has seen oncology for it.  Denies any dysuria, hematuria or urinary hesitancy or resistance.  History of thymoma s/p resection: He refused chemoradiation.  He is under annual CT chest surveillance.  He smokes 1-1.5 pack/day.  Denies any dyspnea or wheezing currently.  Of note, he takes Cymbalta 30 mg BID,  but does not know why he takes it.  His wife reports remote history of MDD.  He denies anhedonia, anxiety, SI or HI currently.   Past Medical History:  Diagnosis Date   Arthritis    Cancer (HCC)    Chronic kidney disease    Hypertension    Stroke (HCC)    mild left sided weakness   Vocal cord polyp     Past Surgical History:  Procedure Laterality Date   BACK SURGERY     MICROLARYNGOSCOPY N/A 08/29/2018   Procedure: DIRECT MICROLARYNGOSCOPY WITH EXCISION OF LARYNGEAL MASS;  Surgeon: Newman Pies, MD;  Location: [facility];  Service: ENT;  Laterality: N/A;   STERNOTOMY     THYMECTOMY      Family History  Problem Relation Age of Onset   Diabetes Mother    Cancer Mother    Colon cancer Sister    Pneumonia Father    Emphysema Brother     Social History   Socioeconomic History   Marital status: Married    Spouse name: Not on file   Number of children: Not on file   Years of education: Not on file   Highest education level: Not on file  Occupational History   Not on file  Tobacco Use   Smoking status: Every Day    Current packs/day: 0.50    Average packs/day: 0.5 packs/day for 30.0 years (15.0 ttl pk-yrs)  Types: Cigarettes   Smokeless tobacco: Never  Vaping Use   Vaping status: Never Used  Substance and Sexual Activity   Alcohol use: No   Drug use: No   Sexual activity: Yes  Other Topics Concern   Not on file  Social History Narrative   Been on disability since age 64 due to a stroke.    Used to work at [employer].    Married. No children. Married for 15 years.   Smoke cigarettes, since age 70 years.    No alcohol. No drugs.   Enjoys fishing.    Attends church.   Eats all foods.   Wear seatbelt.    Drives.    Social Determinants of Health   Financial Resource Strain: Not on file  Food Insecurity: No Food Insecurity (04/27/2023)   Hunger Vital Sign    Worried About Running Out of Food in the Last Year: Never true    Ran Out of Food in the Last  Year: Never true  Transportation Needs: No Transportation Needs (04/27/2023)   PRAPARE - Administrator, Civil Service (Medical): No    Lack of Transportation (Non-Medical): No  Physical Activity: Not on file  Stress: Not on file  Social Connections: Not on file  Intimate Partner Violence: Not At Risk (04/27/2023)   Humiliation, Afraid, Rape, and Kick questionnaire    Fear of Current or Ex-Partner: No    Emotionally Abused: No    Physically Abused: No    Sexually Abused: No    ROS Review of Systems  Constitutional:  Negative for chills and fever.  HENT:  Negative for congestion and sore throat.   Eyes:  Negative for pain and discharge.  Respiratory:  Negative for cough and shortness of breath.   Cardiovascular:  Negative for chest pain and palpitations.  Gastrointestinal:  Negative for diarrhea, nausea and vomiting.  Endocrine: Negative for polydipsia and polyuria.  Genitourinary:  Negative for dysuria and hematuria.  Musculoskeletal:  Negative for neck pain and neck stiffness.  Skin:  Negative for rash.  Neurological:  Negative for dizziness, weakness, numbness and headaches.  Psychiatric/Behavioral:  Positive for confusion. Negative for agitation and behavioral problems.     Objective:   Today's Vitals: BP 133/81   Pulse 82   Resp 16   Ht 6' (1.829 m)   Wt 219 lb (99.3 kg)   SpO2 92%   BMI 29.70 kg/m   Physical Exam Vitals reviewed.  Constitutional:      General: He is not in acute distress.    Appearance: He is not diaphoretic.  HENT:     Head: Normocephalic and atraumatic.     Nose: Nose normal.     Mouth/Throat:     Mouth: Mucous membranes are moist.  Eyes:     General: No scleral icterus.    Extraocular Movements: Extraocular movements intact.  Cardiovascular:     Rate and Rhythm: Normal rate and regular rhythm.     Heart sounds: Normal heart sounds. No murmur heard. Pulmonary:     Breath sounds: Normal breath sounds. No wheezing or rales.   Abdominal:     Palpations: Abdomen is soft.     Tenderness: There is no abdominal tenderness.  Musculoskeletal:     Cervical back: Neck supple. No tenderness.     Right lower leg: No edema.     Left lower leg: No edema.  Skin:    General: Skin is warm.     Findings: No rash.  Neurological:     General: No focal deficit present.     Mental Status: He is alert and oriented to person, place, and time.     Sensory: No sensory deficit.     Motor: No weakness.     Gait: Gait abnormal.  Psychiatric:        Mood and Affect: Mood normal.        Behavior: Behavior is cooperative.     Assessment & Plan:   Problem List Items Addressed This Visit       Cardiovascular and Mediastinum   Essential hypertension - Primary    BP Readings from Last 1 Encounters:  08/25/23 133/81   Well-controlled with Amlodipine 10 mg QD and hydrochlorothiazide 12.5 mg QD Chart review suggests history of uncontrolled HTN, added losartan 25 mg QD considering h/o CKD Counseled for compliance with the medications Advised DASH diet and moderate exercise/walking, at least 150 mins/week       Relevant Medications   losartan (COZAAR) 25 MG tablet   amLODipine (NORVASC) 10 MG tablet   hydrochlorothiazide (HYDRODIURIL) 12.5 MG tablet   atorvastatin (LIPITOR) 40 MG tablet   Other Relevant Orders   TSH   CMP14+EGFR   CBC with Differential/Platelet     Genitourinary   CKD (chronic kidney disease) stage 3, GFR 30-59 ml/min (HCC)    GFR stays around 50-55 Used to be followed by Dr. Wolfgang Phoenix Needs to be on ARB, sent Losartan prescription Maintain adequate hydration        Other   Tobacco abuse (Chronic)    Smokes about 1-1.5 pack/day  Asked about quitting: confirms that he/she currently smokes cigarettes Advise to quit smoking: Educated about QUITTING to reduce the risk of cancer, cardio and cerebrovascular disease. Assess willingness: Unwilling to quit at this time, but is working on cutting  back. Assist with counseling and pharmacotherapy: Counseled for 5 minutes and literature provided. Arrange for follow up: follow up in 3 months and continue to offer help.      Thymoma    S/p resection, refused chemotherapy Currently on under annual CT chest surveillance - followed by Oncology      History of CVA (cerebrovascular accident)    No residual deficits His memory concern is likely due to dementia versus other structural abnormality On aspirin and statin Check MRI brain due to abnormal behavior and gait disturbance      Relevant Orders   TSH   MR Brain Wo Contrast   Memory deficits    Has had episodes of confusion He is relatively young for dementia, no significant family history of dementia Concern for structural abnormality-check MRI brain Considering his current mental condition, will taper and discontinue Cymbalta as there is no clear indication to continue Cymbalta      Relevant Orders   TSH   MR Brain Wo Contrast   Ambulatory referral to Neurology   Mixed hyperlipidemia    On statin      Relevant Medications   losartan (COZAAR) 25 MG tablet   amLODipine (NORVASC) 10 MG tablet   hydrochlorothiazide (HYDRODIURIL) 12.5 MG tablet   atorvastatin (LIPITOR) 40 MG tablet   Other Relevant Orders   Lipid panel   Gait disturbance    Has gait disturbance and abnormal behavior pattern with memory deficits Check MRI of brain Refer to neurology      Relevant Orders   TSH   B12   MR Brain Wo Contrast   Ambulatory referral to Neurology   Other Visit  Diagnoses     Screening for prostate cancer       Relevant Orders   PSA   Vitamin D deficiency       Relevant Orders   VITAMIN D 25 Hydroxy (Vit-D Deficiency, Fractures)   Need for hepatitis C screening test       Relevant Orders   Hepatitis C Antibody   Hyperglycemia       Relevant Orders   Hemoglobin A1c       Outpatient Encounter Medications as of 08/25/2023  Medication Sig   DULoxetine (CYMBALTA)  30 MG capsule Take 30 mg by mouth 2 (two) times daily.   [DISCONTINUED] amLODipine (NORVASC) 10 MG tablet Take 10 mg by mouth daily.   [DISCONTINUED] atorvastatin (LIPITOR) 40 MG tablet Take 1 tablet (40 mg total) by mouth daily.   [DISCONTINUED] hydrochlorothiazide (HYDRODIURIL) 12.5 MG tablet Take 12.5 mg by mouth daily.   [DISCONTINUED] losartan (COZAAR) 25 MG tablet Take 1 tablet (25 mg total) by mouth daily.   amLODipine (NORVASC) 10 MG tablet Take 1 tablet (10 mg total) by mouth daily.   atorvastatin (LIPITOR) 40 MG tablet Take 1 tablet (40 mg total) by mouth daily.   hydrochlorothiazide (HYDRODIURIL) 12.5 MG tablet Take 1 tablet (12.5 mg total) by mouth daily.   losartan (COZAAR) 25 MG tablet Take 1 tablet (25 mg total) by mouth daily.   Facility-Administered Encounter Medications as of 08/25/2023  Medication   aspirin chewable tablet 81 mg    Follow-up: Return in about 2 months (around 10/25/2023) for HTN.   Anabel Halon, MD

## 2023-08-25 NOTE — Assessment & Plan Note (Signed)
Has gait disturbance and abnormal behavior pattern with memory deficits Check MRI of brain Refer to neurology

## 2023-08-25 NOTE — Patient Instructions (Addendum)
Please take Cymbalta only once daily for 1 week and then stop it.  Please start taking Losartan 25 mg once daily in addition to Amlodipine and hydrochlorothiazide for blood pressure.  Please take Aspirin and Atorvastatin for history of stroke.  Please get blood tests done after 2 weeks.  Please contact 1-800-QUIT-NOW for smoking cessation supplies.

## 2023-08-25 NOTE — Assessment & Plan Note (Signed)
On statin.

## 2023-08-25 NOTE — Assessment & Plan Note (Signed)
No residual deficits His memory concern is likely due to dementia versus other structural abnormality On aspirin and statin Check MRI brain due to abnormal behavior and gait disturbance

## 2023-08-25 NOTE — Assessment & Plan Note (Signed)
BP Readings from Last 1 Encounters:  08/25/23 133/81   Well-controlled with Amlodipine 10 mg QD and hydrochlorothiazide 12.5 mg QD Chart review suggests history of uncontrolled HTN, added losartan 25 mg QD considering h/o CKD Counseled for compliance with the medications Advised DASH diet and moderate exercise/walking, at least 150 mins/week

## 2023-09-01 ENCOUNTER — Inpatient Hospital Stay: Payer: Medicare PPO | Attending: Internal Medicine

## 2023-09-01 ENCOUNTER — Ambulatory Visit (HOSPITAL_COMMUNITY): Admission: RE | Admit: 2023-09-01 | Payer: Medicare PPO | Source: Ambulatory Visit

## 2023-09-02 ENCOUNTER — Encounter: Payer: Self-pay | Admitting: Physician Assistant

## 2023-09-03 ENCOUNTER — Ambulatory Visit (HOSPITAL_COMMUNITY)
Admission: RE | Admit: 2023-09-03 | Discharge: 2023-09-03 | Disposition: A | Discharge status: Home / Self Care | Payer: Medicare PPO | Source: Ambulatory Visit | Attending: Internal Medicine | Admitting: Internal Medicine

## 2023-09-03 DIAGNOSIS — Z8673 Personal history of transient ischemic attack (TIA), and cerebral infarction without residual deficits: Secondary | ICD-10-CM | POA: Insufficient documentation

## 2023-09-03 DIAGNOSIS — R269 Unspecified abnormalities of gait and mobility: Secondary | ICD-10-CM | POA: Diagnosis not present

## 2023-09-03 DIAGNOSIS — I6381 Other cerebral infarction due to occlusion or stenosis of small artery: Secondary | ICD-10-CM | POA: Diagnosis not present

## 2023-09-03 DIAGNOSIS — R4182 Altered mental status, unspecified: Secondary | ICD-10-CM | POA: Diagnosis not present

## 2023-09-03 DIAGNOSIS — G3189 Other specified degenerative diseases of nervous system: Secondary | ICD-10-CM | POA: Diagnosis not present

## 2023-09-03 DIAGNOSIS — R413 Other amnesia: Secondary | ICD-10-CM | POA: Diagnosis not present

## 2023-09-03 DIAGNOSIS — I6782 Cerebral ischemia: Secondary | ICD-10-CM | POA: Diagnosis not present

## 2023-09-08 ENCOUNTER — Ambulatory Visit: Payer: Medicare Other | Admitting: Physician Assistant

## 2023-09-08 ENCOUNTER — Inpatient Hospital Stay: Payer: Medicare PPO | Admitting: Hematology

## 2023-09-23 ENCOUNTER — Other Ambulatory Visit (INDEPENDENT_AMBULATORY_CARE_PROVIDER_SITE_OTHER): Payer: Medicare PPO

## 2023-09-23 ENCOUNTER — Ambulatory Visit: Payer: Medicare PPO | Admitting: Physician Assistant

## 2023-09-23 ENCOUNTER — Encounter: Payer: Self-pay | Admitting: Physician Assistant

## 2023-09-23 VITALS — BP 147/73 | HR 88 | Resp 18 | Ht 73.0 in | Wt 220.0 lb

## 2023-09-23 DIAGNOSIS — F01C0 Vascular dementia, severe, without behavioral disturbance, psychotic disturbance, mood disturbance, and anxiety: Secondary | ICD-10-CM | POA: Diagnosis not present

## 2023-09-23 DIAGNOSIS — R413 Other amnesia: Secondary | ICD-10-CM

## 2023-09-23 LAB — VITAMIN B12: Vitamin B-12: 165 pg/mL — ABNORMAL LOW (ref 211–911)
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
## 2023-09-23 NOTE — Progress Notes (Signed)
B12 is low, he needs to start taking 1 tablet (1000 mcg) daily and follow-up with the primary doctor.  Thanks

## 2023-09-23 NOTE — Patient Instructions (Signed)
Labs today [address]

## 2023-09-27 LAB — VITAMIN B1: Vitamin B1 (Thiamine): 7 nmol/L — ABNORMAL LOW (ref 8–30)

## 2023-09-27 NOTE — Progress Notes (Signed)
B1 is very low, Recommend B1 supplementation with 100 mg B1 daily. No drinking. Thanks

## 2023-09-28 ENCOUNTER — Telehealth: Payer: Self-pay | Admitting: Physician Assistant

## 2023-09-28 NOTE — Telephone Encounter (Signed)
Pt wife called wanting to know what the name of the vitamins are so she can get the medication for him

## 2023-09-28 NOTE — Telephone Encounter (Signed)
I advised of taken b1 100mg  daily and b12 daily.Thanked me for calling, verbally understood and no further questions needed

## 2023-09-30 NOTE — Telephone Encounter (Signed)
Patient wife called about a letter, that was suppose to be typed out for the patient to take too court

## 2023-10-04 ENCOUNTER — Encounter: Payer: Self-pay | Admitting: Physician Assistant

## 2023-10-05 NOTE — Telephone Encounter (Signed)
Letter mailed for patient to dismiss from jury duty. Written by Jerelyn Charles. Advised it would be mailed out tomorrow, since they have picked up our mail for today. She thanked me for calling.

## 2023-10-06 ENCOUNTER — Ambulatory Visit (HOSPITAL_COMMUNITY): Admission: RE | Admit: 2023-10-06 | Payer: Medicare PPO | Source: Ambulatory Visit

## 2023-10-25 ENCOUNTER — Ambulatory Visit (INDEPENDENT_AMBULATORY_CARE_PROVIDER_SITE_OTHER): Payer: Medicare PPO | Admitting: Internal Medicine

## 2023-10-25 ENCOUNTER — Encounter: Payer: Self-pay | Admitting: Internal Medicine

## 2023-10-25 VITALS — BP 152/88 | HR 82 | Ht 72.0 in | Wt 222.2 lb

## 2023-10-25 DIAGNOSIS — Z720 Tobacco use: Secondary | ICD-10-CM

## 2023-10-25 DIAGNOSIS — F01C18 Vascular dementia, severe, with other behavioral disturbance: Secondary | ICD-10-CM

## 2023-10-25 DIAGNOSIS — E538 Deficiency of other specified B group vitamins: Secondary | ICD-10-CM

## 2023-10-25 DIAGNOSIS — I10 Essential (primary) hypertension: Secondary | ICD-10-CM | POA: Diagnosis not present

## 2023-10-25 DIAGNOSIS — Z23 Encounter for immunization: Secondary | ICD-10-CM | POA: Diagnosis not present

## 2023-10-25 DIAGNOSIS — N1831 Chronic kidney disease, stage 3a: Secondary | ICD-10-CM

## 2023-10-25 MED ORDER — CYANOCOBALAMIN 1000 MCG/ML IJ SOLN
1000.0000 ug | Freq: Once | INTRAMUSCULAR | Status: AC
Start: 2023-10-25 — End: 2023-10-25
  Administered 2023-10-25: 1000 ug via INTRAMUSCULAR

## 2023-10-25 MED ORDER — AMLODIPINE BESYLATE 10 MG PO TABS
10.0000 mg | ORAL_TABLET | Freq: Every day | ORAL | 1 refills | Status: DC
Start: 2023-10-25 — End: 2024-06-26

## 2023-10-25 NOTE — Assessment & Plan Note (Signed)
Smokes about 1 pack/day  Asked about quitting: confirms that he/she currently smokes cigarettes Advise to quit smoking: Educated about QUITTING to reduce the risk of cancer, cardio and cerebrovascular disease. Assess willingness: Unwilling to quit at this time, but is working on cutting back. Assist with counseling and pharmacotherapy: Counseled for 5 minutes and literature provided. Discussed about Wellbutrin, but he needs to be ready to quit. Arrange for follow up: follow up in 3 months and continue to offer help.

## 2023-10-25 NOTE — Assessment & Plan Note (Signed)
GFR stays around 50-55, last BMP showed GFR of 64 Used to be followed by Dr. Wolfgang Phoenix Needs to be on ARB, sent Losartan prescription Maintain adequate hydration

## 2023-10-25 NOTE — Assessment & Plan Note (Addendum)
MRI brain reviewed Has been evaluated by Neurology Needs to be compliant to HTN medicines Had low B12 and B1 levels - on supplements now B12 injection today DC Cymbalta as there is no clear indication currently
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
## 2023-10-25 NOTE — Assessment & Plan Note (Signed)
Lab Results  Component Value Date   VITAMINB12 165 (L) 09/23/2023   On oral Vit B12 1000 mcg once daily B12 injection today

## 2023-10-25 NOTE — Patient Instructions (Signed)
Please take Cymbalta only once daily for 1 week and then stop it.   Please start taking Amlodipine 10 mg once daily in addition to Losartan and hydrochlorothiazide for blood pressure.  Please take Aspirin and Atorvastatin for history of stroke.   Please contact 1-800-QUIT-NOW for smoking cessation supplies.

## 2023-10-25 NOTE — Assessment & Plan Note (Addendum)
BP Readings from Last 1 Encounters:  10/25/23 (!) 152/88   Elevated today as he has run out of Amlodipine Usually Well-controlled with Losartan 25 mg once daily, Amlodipine 10 mg QD and hydrochlorothiazide 12.5 mg once daily Counseled for compliance with the medications Advised DASH diet and moderate exercise/walking, at least 150 mins/week

## 2023-10-26 LAB — BASIC METABOLIC PANEL
BUN/Creatinine Ratio: 13 (ref 10–24)
BUN: 16 mg/dL (ref 8–27)
CO2: 24 mmol/L (ref 20–29)
Calcium: 9.5 mg/dL (ref 8.6–10.2)
Chloride: 102 mmol/L (ref 96–106)
Creatinine, Ser: 1.25 mg/dL (ref 0.76–1.27)
Glucose: 96 mg/dL (ref 70–99)
Potassium: 4.3 mmol/L (ref 3.5–5.2)
Sodium: 141 mmol/L (ref 134–144)
eGFR: 64 mL/min/{1.73_m2} (ref >59)

## 2023-10-26 LAB — MICROALBUMIN / CREATININE URINE RATIO
Creatinine, Urine: 234.6 mg/dL
Microalb/Creat Ratio: 10 mg/g{creat} (ref 0–29)
Microalbumin, Urine: 23.4 ug/mL

## 2023-10-26 LAB — CBC WITH DIFFERENTIAL/PLATELET
Basophils Absolute: 0.1 10*3/uL (ref 0.0–0.2)
Basos: 1 %
EOS (ABSOLUTE): 0.1 10*3/uL (ref 0.0–0.4)
Eos: 2 %
Hematocrit: 46.1 % (ref 37.5–51.0)
Hemoglobin: 14.9 g/dL (ref 13.0–17.7)
Immature Grans (Abs): 0 10*3/uL (ref 0.0–0.1)
Immature Granulocytes: 0 %
Lymphocytes Absolute: 1.8 10*3/uL (ref 0.7–3.1)
Lymphs: 35 %
MCH: 33 pg (ref 26.6–33.0)
MCHC: 32.3 g/dL (ref 31.5–35.7)
MCV: 102 fL — ABNORMAL HIGH (ref 79–97)
Monocytes Absolute: 0.5 10*3/uL (ref 0.1–0.9)
Monocytes: 9 %
Neutrophils Absolute: 2.7 10*3/uL (ref 1.4–7.0)
Neutrophils: 53 %
Platelets: 309 10*3/uL (ref 150–450)
RBC: 4.52 x10E6/uL (ref 4.14–5.80)
RDW: 11.1 % — ABNORMAL LOW (ref 11.6–15.4)
WBC: 5.2 10*3/uL (ref 3.4–10.8)

## 2023-11-25 ENCOUNTER — Ambulatory Visit (HOSPITAL_COMMUNITY)
Admission: RE | Admit: 2023-11-25 | Discharge: 2023-11-25 | Disposition: A | Discharge status: Home / Self Care | Payer: Medicare PPO | Source: Ambulatory Visit | Attending: Physician Assistant | Admitting: Physician Assistant

## 2023-11-25 DIAGNOSIS — R768 Other specified abnormal immunological findings in serum: Secondary | ICD-10-CM | POA: Diagnosis not present

## 2023-11-25 DIAGNOSIS — M170 Bilateral primary osteoarthritis of knee: Secondary | ICD-10-CM | POA: Diagnosis not present

## 2023-11-25 DIAGNOSIS — M47816 Spondylosis without myelopathy or radiculopathy, lumbar region: Secondary | ICD-10-CM | POA: Diagnosis not present

## 2023-12-08 ENCOUNTER — Inpatient Hospital Stay: Payer: Medicare PPO | Attending: Internal Medicine

## 2023-12-08 DIAGNOSIS — F17210 Nicotine dependence, cigarettes, uncomplicated: Secondary | ICD-10-CM | POA: Insufficient documentation

## 2023-12-08 DIAGNOSIS — Z79899 Other long term (current) drug therapy: Secondary | ICD-10-CM | POA: Insufficient documentation

## 2023-12-08 DIAGNOSIS — R768 Other specified abnormal immunological findings in serum: Secondary | ICD-10-CM

## 2023-12-08 DIAGNOSIS — Z7982 Long term (current) use of aspirin: Secondary | ICD-10-CM | POA: Insufficient documentation

## 2023-12-08 DIAGNOSIS — N189 Chronic kidney disease, unspecified: Secondary | ICD-10-CM | POA: Diagnosis not present

## 2023-12-08 DIAGNOSIS — I129 Hypertensive chronic kidney disease with stage 1 through stage 4 chronic kidney disease, or unspecified chronic kidney disease: Secondary | ICD-10-CM | POA: Diagnosis not present

## 2023-12-08 DIAGNOSIS — C9000 Multiple myeloma not having achieved remission: Secondary | ICD-10-CM | POA: Insufficient documentation

## 2023-12-08 DIAGNOSIS — D4989 Neoplasm of unspecified behavior of other specified sites: Secondary | ICD-10-CM

## 2023-12-08 DIAGNOSIS — Z85238 Personal history of other malignant neoplasm of thymus: Secondary | ICD-10-CM | POA: Insufficient documentation

## 2023-12-08 LAB — CBC WITH DIFFERENTIAL/PLATELET
Abs Immature Granulocytes: 0.02 10*3/uL (ref 0.00–0.07)
Basophils Absolute: 0.1 10*3/uL (ref 0.0–0.1)
Basophils Relative: 1 %
Eosinophils Absolute: 0.1 10*3/uL (ref 0.0–0.5)
Eosinophils Relative: 1 %
HCT: 44.8 % (ref 39.0–52.0)
Hemoglobin: 14.7 g/dL (ref 13.0–17.0)
Immature Granulocytes: 0 %
Lymphocytes Relative: 38 %
Lymphs Abs: 2 10*3/uL (ref 0.7–4.0)
MCH: 32.8 pg (ref 26.0–34.0)
MCHC: 32.8 g/dL (ref 30.0–36.0)
MCV: 100 fL (ref 80.0–100.0)
Monocytes Absolute: 0.5 10*3/uL (ref 0.1–1.0)
Monocytes Relative: 10 %
Neutro Abs: 2.6 10*3/uL (ref 1.7–7.7)
Neutrophils Relative %: 50 %
Platelets: 327 10*3/uL (ref 150–400)
RBC: 4.48 MIL/uL (ref 4.22–5.81)
RDW: 12.6 % (ref 11.5–15.5)
WBC: 5.2 10*3/uL (ref 4.0–10.5)
nRBC: 0 % (ref 0.0–0.2)

## 2023-12-08 LAB — COMPREHENSIVE METABOLIC PANEL
ALT: 14 U/L (ref 0–44)
AST: 20 U/L (ref 15–41)
Albumin: 4 g/dL (ref 3.5–5.0)
Alkaline Phosphatase: 64 U/L (ref 38–126)
Anion gap: 9 (ref 5–15)
BUN: 15 mg/dL (ref 8–23)
CO2: 28 mmol/L (ref 22–32)
Calcium: 9.3 mg/dL (ref 8.9–10.3)
Chloride: 101 mmol/L (ref 98–111)
Creatinine, Ser: 1.41 mg/dL — ABNORMAL HIGH (ref 0.61–1.24)
GFR, Estimated: 55 mL/min — ABNORMAL LOW (ref >60)
Glucose, Bld: 107 mg/dL — ABNORMAL HIGH (ref 70–99)
Potassium: 3.8 mmol/L (ref 3.5–5.1)
Sodium: 138 mmol/L (ref 135–145)
Total Bilirubin: 0.7 mg/dL (ref <1.2)
Total Protein: 8.1 g/dL (ref 6.5–8.1)

## 2023-12-08 LAB — LACTATE DEHYDROGENASE: LDH: 115 U/L (ref 98–192)

## 2023-12-09 LAB — KAPPA/LAMBDA LIGHT CHAINS
Kappa free light chain: 202.8 mg/L — ABNORMAL HIGH (ref 3.3–19.4)
Kappa, lambda light chain ratio: 6.15 — ABNORMAL HIGH (ref 0.26–1.65)
Lambda free light chains: 33 mg/L — ABNORMAL HIGH (ref 5.7–26.3)

## 2023-12-09 LAB — BETA 2 MICROGLOBULIN, SERUM: Beta-2 Microglobulin: 2.2 mg/L (ref 0.6–2.4)

## 2023-12-12 ENCOUNTER — Ambulatory Visit (INDEPENDENT_AMBULATORY_CARE_PROVIDER_SITE_OTHER): Payer: Medicare PPO | Admitting: Family Medicine

## 2023-12-12 ENCOUNTER — Encounter: Payer: Self-pay | Admitting: Family Medicine

## 2023-12-12 VITALS — BP 128/82 | HR 96 | Ht 72.0 in | Wt 226.0 lb

## 2023-12-12 DIAGNOSIS — L602 Onychogryphosis: Secondary | ICD-10-CM | POA: Insufficient documentation

## 2023-12-12 NOTE — Patient Instructions (Addendum)
I appreciate the opportunity to provide care to you today!    Follow up:  pcp  Onychogryphosis Management: -Foot Hygiene:I recommend washing your feet daily and drying them thoroughly, especially between the toes, to prevent fungal infections that can worsen nail conditions.  -Soaking:Soak the affected nails in warm, soapy water or an Epsom salt solution to soften the nails and make them easier to trim and manage. This also helps reduce any pain or discomfort caused by the nails' thickness.  -Protective Footwear:Proper Shoes: Wear shoes that fit properly and avoid tight, ill-fitting footwear, which can put additional pressure on the nails and cause discomfort or injury. -Padding or Cushions: Use soft cushioning around the affected nails, especially if they cause pain or irritation. Gel pads or toe protectors can help reduce friction and prevent further damage.    Referrals today-  Podiatry    Please continue to a heart-healthy diet and increase your physical activities. Try to exercise for at least five days a week.    It was a pleasure to see you and I look forward to continuing to work together on your health and well-being. Please do not hesitate to call the office if you need care or have questions about your care.  In case of emergency, please visit the Emergency Department for urgent care, or contact our clinic at [phone] to schedule an appointment. We're here to help you!   Have a wonderful day and week. With Gratitude, Gilmore Laroche MSN, FNP-BC

## 2023-12-12 NOTE — Assessment & Plan Note (Signed)
No pain reported. Referral placed to podiatry. Recommendations: Foot Hygiene: Wash your feet daily and dry them thoroughly, especially between the toes, to prevent fungal infections that can worsen nail conditions. Soaking: Soak the affected nails in warm, soapy water or an Epsom salt solution to soften the nails, making them easier to trim and manage. This also helps reduce any discomfort caused by the nails' thickness. Protective Footwear:Proper Shoes: Wear shoes that fit properly and avoid tight, ill-fitting footwear, which can put additional pressure on the nails and cause discomfort or injury. Padding or Cushions: Use soft cushioning around the affected nails, especially if they cause pain or irritation. Gel pads or toe protectors can help reduce friction and prevent further damage.

## 2023-12-12 NOTE — Progress Notes (Signed)
Acute Office Visit  Subjective:    Patient ID: Derrick Gaines, male    DOB: 01-22-1956, 67 y.o.   MRN: [id number]  Chief Complaint  Patient presents with   Nail Problem    Pt c/o toenail, states he has checked with podiatrist around however a referral is required to be seen. Problem is on both feet.    HPI Patient is in today requesting a referral to podiatry. For the details of today's visit, please refer to the assessment and plan.     Past Medical History:  Diagnosis Date   Arthritis    Cancer (HCC)    Chronic kidney disease    Hypertension    Stroke (HCC)    mild left sided weakness   Vocal cord polyp     Past Surgical History:  Procedure Laterality Date   BACK SURGERY     MICROLARYNGOSCOPY N/A 08/29/2018   Procedure: DIRECT MICROLARYNGOSCOPY WITH EXCISION OF LARYNGEAL MASS;  Surgeon: Newman Pies, MD;  Location: [facility];  Service: ENT;  Laterality: N/A;   STERNOTOMY     THYMECTOMY      Family History  Problem Relation Age of Onset   Diabetes Mother    Cancer Mother    Colon cancer Sister    Pneumonia Father    Emphysema Brother     Social History   Socioeconomic History   Marital status: Married    Spouse name: Not on file   Number of children: Not on file   Years of education: Not on file   Highest education level: Not on file  Occupational History   Not on file  Tobacco Use   Smoking status: Every Day    Current packs/day: 0.50    Average packs/day: 0.5 packs/day for 30.0 years (15.0 ttl pk-yrs)    Types: Cigarettes   Smokeless tobacco: Never  Vaping Use   Vaping status: Never Used  Substance and Sexual Activity   Alcohol use: No   Drug use: No   Sexual activity: Yes  Other Topics Concern   Not on file  Social History Narrative   Been on disability since age 75 due to a stroke.    Used to work at [employer].    Married. No children. Married for 15 years.   Smoke cigarettes, since age 38 years.    No alcohol. No drugs.    Enjoys fishing.    Attends church.   Eats all foods.   Wear seatbelt.    Drives.    Right handed   One story home   Lives with wife , and son and wifes two sisters   Social Drivers of Corporate investment banker Strain: Not on file  Food Insecurity: No Food Insecurity (04/27/2023)   Hunger Vital Sign    Worried About Running Out of Food in the Last Year: Never true    Ran Out of Food in the Last Year: Never true  Transportation Needs: No Transportation Needs (04/27/2023)   PRAPARE - Administrator, Civil Service (Medical): No    Lack of Transportation (Non-Medical): No  Physical Activity: Not on file  Stress: Not on file  Social Connections: Not on file  Intimate Partner Violence: Not At Risk (04/27/2023)   Humiliation, Afraid, Rape, and Kick questionnaire    Fear of Current or Ex-Partner: No    Emotionally Abused: No    Physically Abused: No    Sexually Abused: No    Outpatient  Medications Prior to Visit  Medication Sig Dispense Refill   amLODipine (NORVASC) 10 MG tablet Take 1 tablet (10 mg total) by mouth daily. 90 tablet 1   aspirin EC 81 MG tablet Take 81 mg by mouth daily. Swallow whole.     atorvastatin (LIPITOR) 40 MG tablet Take 1 tablet (40 mg total) by mouth daily. 90 tablet 1   hydrochlorothiazide (HYDRODIURIL) 12.5 MG tablet Take 1 tablet (12.5 mg total) by mouth daily. 90 tablet 1   losartan (COZAAR) 25 MG tablet Take 1 tablet (25 mg total) by mouth daily. 90 tablet 1   No facility-administered medications prior to visit.    No Known Allergies  Review of Systems  Constitutional:  Negative for fatigue and fever.  Eyes:  Negative for visual disturbance.  Respiratory:  Negative for chest tightness and shortness of breath.   Cardiovascular:  Negative for chest pain and palpitations.  Neurological:  Negative for dizziness and headaches.       Objective:    Physical Exam HENT:     Head: Normocephalic.     Right Ear: External ear normal.     Left  Ear: External ear normal.     Nose: No congestion or rhinorrhea.     Mouth/Throat:     Mouth: Mucous membranes are moist.  Cardiovascular:     Rate and Rhythm: Regular rhythm.     Heart sounds: No murmur heard. Pulmonary:     Effort: No respiratory distress.     Breath sounds: Normal breath sounds.  Feet:     Right foot:     Toenail Condition: Right toenails are abnormally thick and long.     Left foot:     Toenail Condition: Left toenails are abnormally thick and long.  Neurological:     Mental Status: He is alert.     BP 128/82   Pulse 96   Ht 6' (1.829 m)   Wt 226 lb (102.5 kg)   SpO2 96%   BMI 30.65 kg/m  Wt Readings from Last 3 Encounters:  12/12/23 226 lb (102.5 kg)  10/25/23 222 lb 3.2 oz (100.8 kg)  09/23/23 220 lb (99.8 kg)       Assessment & Plan:  Onychogryposis of toenail Assessment & Plan: No pain reported. Referral placed to podiatry. Recommendations: Foot Hygiene: Wash your feet daily and dry them thoroughly, especially between the toes, to prevent fungal infections that can worsen nail conditions. Soaking: Soak the affected nails in warm, soapy water or an Epsom salt solution to soften the nails, making them easier to trim and manage. This also helps reduce any discomfort caused by the nails' thickness. Protective Footwear:Proper Shoes: Wear shoes that fit properly and avoid tight, ill-fitting footwear, which can put additional pressure on the nails and cause discomfort or injury. Padding or Cushions: Use soft cushioning around the affected nails, especially if they cause pain or irritation. Gel pads or toe protectors can help reduce friction and prevent further damage.   Orders: -     Ambulatory referral to Podiatry  Note: This chart has been completed using Engineer, civil (consulting) software, and while attempts have been made to ensure accuracy, certain words and phrases may not be transcribed as intended.    Gilmore Laroche, FNP

## 2023-12-14 NOTE — Progress Notes (Unsigned)
Derrick Gaines [address], [place] [address]   CLINIC:  Medical Oncology/Hematology  PCP:  Anabel Halon, MD [address] [place] [address] [phone]   REASON FOR VISIT:  Follow-up for elevated free light chains + thymic carcinoma s/p surgical resection  CURRENT THERAPY: Surveillance  INTERVAL HISTORY:   Derrick Gaines 67 y.o. male returns for routine follow-up of elevated free light chains and history of thymic carcinoma.  He was last seen by Rojelio Brenner PA-C on 03/01/2023.    At today's visit, he reports feeling well.  He denies any new onset bone pains.  He denies any B symptoms. He denies any recurrent infections.   He denies any neurologic changes, numbness/tingling in hands or feet, headaches, or vision changes.   No new masses or lymphadenopathy.   He denies any new cough, shortness of breath, chest pain, hoarseness, or upper body swelling.   He has 100% energy and 100% appetite. He endorses that he is maintaining a stable weight.  ASSESSMENT & PLAN:  1.  Light chain MGUS - Seen at the request of Dr. Wolfgang Gaines for abnormal free light chains noted in 2022 - Labs on 04/07/2021: SPEP-poorly defined band of restricted protein mobility in the gamma region.  SIFX-negative.  FLC ratio 4.22, kappa light chains 137, lambda light chain 32.5.  Creatinine was 1.41.  Hemoglobin was normal. - 24-hour urine (06/21/2022) showed total protein 803 mg, with elevated free light chain ratio, no evidence of M spike, and unremarkable immunofixation - MGUS/myeloma panel (02/14/2023): SPEP unremarkable Serum immunofixation showed POLYCLONAL increase in immunoglobulins Elevated kappa free light chain 154.6, lambda 29.9, elevated ratio 5.17. No apparent CRAB features, with normal Hgb 15.8, baseline creatinine 1.44 (CKD stage IIIb), calcium 8.7.  Normal LDH. - Most recent skeletal survey (11/25/2023): No acute or destructive bony abnormalities - Most recent MGUS/myeloma  panel (12/08/2023): SPEP negative for M spike Immunofixation pending  Kappa free light chains trending upward at 202.8, with normal lambda 33.0.  FLC ratio trending upward at 6.15. Hgb 14.7, creatinine 1.41 (baseline CKD since 2019), calcium 9.3 Normal LDH and beta-2 microglobulin - No B symptoms, new onset bone pain, or neuropathy  - PLAN: Overall stable.  No concern for myeloma at this time. - Repeat MGUS/myeloma labs at follow-up in 6 months. - Repeat 24-hour urine/UPEP prior to next visit.   - Annual skeletal survey next due November 2025  2.  Poorly differentiated thymic carcinoma - Presentation in 2017 with weight loss of 40 pounds - PET/CT scan (10/05/2016): Anterior mediastinal mass measuring 3.6 x 1.9 cm with SUV of 6.3 - Mediastinal mass resected on 12/29/2016, with pathology showing poorly differentiated thymic carcinoma with foci of squamous differentiation, negative margin for lung tissue, negative margin for pericardial and pleura, 1 lymph node negative, pT2 pN0 M0. - Adjuvant chemoradiation therapy was recommended, patient declined -CT chest with contrast (08/04/2022): No significant residual findings in the anterior mediastinum with no evidence of thymic mass.  Continued slow growth of pleural-based nodule in the left lung apex without aggressive characteristics.  - No B symptoms, chest pain, or new cough  - Patient was no-show for CT scan scheduled in August/September 2024 - PLAN: Due for CT chest - Recommend annual MD visit at time of follow-up in 6 months   3.  Tobacco use - Patient has smoked 1 PPD since age 32, has cut back to 0.5 PPD since 2018  - PLAN: No need for LDCT chest at  this time, since patient is receiving annual CT chest for follow-up of his previous thymic carcinoma. - Patient is interested in smoking cessation.  Transport planner and information regarding [employer] provided during visit.  4.  Other history - Prior to retirement, he worked  at a [employer].  Denies any chemical exposure. - One sister had colon cancer, another sister had breast cancer   PLAN SUMMARY: >> Overdue for CT chest (NCNS in August/September 2024)  >> 24-hour urine/UPEP >> Labs in 6 months = CBC/D, CMP, LDH, SPEP, light chains, immunofixation >> OFFICE visit with DR. KATRAGADDA in 6 months (1 week after labs) - needs ANNUAL MD follow-up      REVIEW OF SYSTEMS: Patient denies any acute complaints  Review of Systems  Constitutional:  Negative for appetite change, chills, diaphoresis, fatigue, fever and unexpected weight change.  HENT:   Negative for lump/mass and nosebleeds.   Eyes:  Negative for eye problems.  Respiratory:  Negative for cough, hemoptysis and shortness of breath.   Cardiovascular:  Negative for chest pain, leg swelling and palpitations.  Gastrointestinal:  Negative for abdominal pain, blood in stool, constipation, diarrhea, nausea and vomiting.  Genitourinary:  Negative for hematuria.   Skin: Negative.   Neurological:  Negative for dizziness, headaches and light-headedness.  Hematological:  Does not bruise/bleed easily.     PHYSICAL EXAM:  ECOG PERFORMANCE STATUS: 0 - Asymptomatic  Vitals:   12/15/23 1308  BP: (!) 130/97  Pulse: 91  Resp: 18  Temp: 97.9 F (36.6 C)  SpO2: 99%   Filed Weights   12/15/23 1308  Weight: 225 lb 12.8 oz (102.4 kg)   Physical Exam Constitutional:      Appearance: Normal appearance. He is obese.  Cardiovascular:     Heart sounds: Normal heart sounds.  Pulmonary:     Breath sounds: Decreased air movement present. Decreased breath sounds present.  Neurological:     General: No focal deficit present.     Mental Status: Mental status is at baseline.  Psychiatric:        Behavior: Behavior normal. Behavior is cooperative.     PAST MEDICAL/SURGICAL HISTORY:  Past Medical History:  Diagnosis Date   Arthritis    Cancer (HCC)    Chronic kidney disease    Hypertension    Stroke (HCC)     mild left sided weakness   Vocal cord polyp    Past Surgical History:  Procedure Laterality Date   BACK SURGERY     MICROLARYNGOSCOPY N/A 08/29/2018   Procedure: DIRECT MICROLARYNGOSCOPY WITH EXCISION OF LARYNGEAL MASS;  Surgeon: Newman Pies, MD;  Location: [facility];  Service: ENT;  Laterality: N/A;   STERNOTOMY     THYMECTOMY      SOCIAL HISTORY:  Social History   Socioeconomic History   Marital status: Married    Spouse name: Not on file   Number of children: Not on file   Years of education: Not on file   Highest education level: Not on file  Occupational History   Not on file  Tobacco Use   Smoking status: Every Day    Current packs/day: 0.50    Average packs/day: 0.5 packs/day for 30.0 years (15.0 ttl pk-yrs)    Types: Cigarettes   Smokeless tobacco: Never  Vaping Use   Vaping status: Never Used  Substance and Sexual Activity   Alcohol use: No   Drug use: No   Sexual activity: Yes  Other Topics Concern  Not on file  Social History Narrative   Been on disability since age 19 due to a stroke.    Used to work at [employer].    Married. No children. Married for 15 years.   Smoke cigarettes, since age 18 years.    No alcohol. No drugs.   Enjoys fishing.    Attends church.   Eats all foods.   Wear seatbelt.    Drives.    Right handed   One story home   Lives with wife , and son and wifes two sisters   Social Drivers of Corporate investment banker Strain: Not on file  Food Insecurity: No Food Insecurity (04/27/2023)   Hunger Vital Sign    Worried About Running Out of Food in the Last Year: Never true    Ran Out of Food in the Last Year: Never true  Transportation Needs: No Transportation Needs (04/27/2023)   PRAPARE - Administrator, Civil Service (Medical): No    Lack of Transportation (Non-Medical): No  Physical Activity: Not on file  Stress: Not on file  Social Connections: Not on file  Intimate Partner Violence: Not At Risk  (04/27/2023)   Humiliation, Afraid, Rape, and Kick questionnaire    Fear of Current or Ex-Partner: No    Emotionally Abused: No    Physically Abused: No    Sexually Abused: No    FAMILY HISTORY:  Family History  Problem Relation Age of Onset   Diabetes Mother    Cancer Mother    Colon cancer Sister    Pneumonia Father    Emphysema Brother     CURRENT MEDICATIONS:  Outpatient Encounter Medications as of 12/15/2023  Medication Sig   amLODipine (NORVASC) 10 MG tablet Take 1 tablet (10 mg total) by mouth daily.   aspirin EC 81 MG tablet Take 81 mg by mouth daily. Swallow whole.   atorvastatin (LIPITOR) 40 MG tablet Take 1 tablet (40 mg total) by mouth daily.   hydrochlorothiazide (HYDRODIURIL) 12.5 MG tablet Take 1 tablet (12.5 mg total) by mouth daily.   losartan (COZAAR) 25 MG tablet Take 1 tablet (25 mg total) by mouth daily.   No facility-administered encounter medications on file as of 12/15/2023.    ALLERGIES:  No Known Allergies  LABORATORY DATA:  I have reviewed the labs as listed.  CBC    Component Value Date/Time   WBC 5.2 12/08/2023 1310   RBC 4.48 12/08/2023 1310   HGB 14.7 12/08/2023 1310   HGB 14.9 10/25/2023 1147   HCT 44.8 12/08/2023 1310   HCT 46.1 10/25/2023 1147   PLT 327 12/08/2023 1310   PLT 309 10/25/2023 1147   MCV 100.0 12/08/2023 1310   MCV 102 (H) 10/25/2023 1147   MCH 32.8 12/08/2023 1310   MCHC 32.8 12/08/2023 1310   RDW 12.6 12/08/2023 1310   RDW 11.1 (L) 10/25/2023 1147   LYMPHSABS 2.0 12/08/2023 1310   LYMPHSABS 1.8 10/25/2023 1147   MONOABS 0.5 12/08/2023 1310   EOSABS 0.1 12/08/2023 1310   EOSABS 0.1 10/25/2023 1147   BASOSABS 0.1 12/08/2023 1310   BASOSABS 0.1 10/25/2023 1147      Latest Ref Rng & Units 12/08/2023    1:10 PM 10/25/2023   11:47 AM 07/04/2023   10:49 AM  CMP  Glucose 70 - 99 mg/dL 161  96  096   BUN 8 - 23 mg/dL 15  16  15    Creatinine 0.61 - 1.24 mg/dL 0.45  4.09  1.25   Sodium 135 - 145 mmol/L 138  141   138   Potassium 3.5 - 5.1 mmol/L 3.8  4.3  3.9   Chloride 98 - 111 mmol/L 101  102  100   CO2 22 - 32 mmol/L 28  24  23    Calcium 8.9 - 10.3 mg/dL 9.3  9.5  9.4   Total Protein 6.5 - 8.1 g/dL 8.1     Total Bilirubin <1.2 mg/dL 0.7     Alkaline Phos 38 - 126 U/L 64     AST 15 - 41 U/L 20     ALT 0 - 44 U/L 14       DIAGNOSTIC IMAGING:  I have independently reviewed the relevant imaging and discussed with the patient.   WRAP UP:  All questions were answered. The patient knows to call the clinic with any problems, questions or concerns.  Medical decision making: Moderate  Time spent on visit: I spent 20 minutes counseling the patient face to face. The total time spent in the appointment was 30 minutes and more than 50% was on counseling.  Carnella Guadalajara, PA-C  12/15/23 9:40 PM

## 2023-12-15 ENCOUNTER — Inpatient Hospital Stay: Payer: Medicare PPO | Admitting: Physician Assistant

## 2023-12-15 VITALS — BP 130/97 | HR 91 | Temp 97.9°F | Resp 18 | Wt 225.8 lb

## 2023-12-15 DIAGNOSIS — Z79899 Other long term (current) drug therapy: Secondary | ICD-10-CM | POA: Diagnosis not present

## 2023-12-15 DIAGNOSIS — R768 Other specified abnormal immunological findings in serum: Secondary | ICD-10-CM

## 2023-12-15 DIAGNOSIS — C9000 Multiple myeloma not having achieved remission: Secondary | ICD-10-CM

## 2023-12-15 DIAGNOSIS — D472 Monoclonal gammopathy: Secondary | ICD-10-CM

## 2023-12-15 DIAGNOSIS — F17210 Nicotine dependence, cigarettes, uncomplicated: Secondary | ICD-10-CM

## 2023-12-15 DIAGNOSIS — N189 Chronic kidney disease, unspecified: Secondary | ICD-10-CM | POA: Diagnosis not present

## 2023-12-15 DIAGNOSIS — Z7982 Long term (current) use of aspirin: Secondary | ICD-10-CM | POA: Diagnosis not present

## 2023-12-15 DIAGNOSIS — D4989 Neoplasm of unspecified behavior of other specified sites: Secondary | ICD-10-CM

## 2023-12-15 DIAGNOSIS — I129 Hypertensive chronic kidney disease with stage 1 through stage 4 chronic kidney disease, or unspecified chronic kidney disease: Secondary | ICD-10-CM | POA: Diagnosis not present

## 2023-12-15 DIAGNOSIS — Z85238 Personal history of other malignant neoplasm of thymus: Secondary | ICD-10-CM | POA: Diagnosis not present

## 2023-12-15 LAB — PROTEIN ELECTROPHORESIS, SERUM
A/G Ratio: 1.1 (ref 0.7–1.7)
Albumin ELP: 3.9 g/dL (ref 2.9–4.4)
Alpha-1-Globulin: 0.3 g/dL (ref 0.0–0.4)
Alpha-2-Globulin: 0.7 g/dL (ref 0.4–1.0)
Beta Globulin: 1 g/dL (ref 0.7–1.3)
Gamma Globulin: 1.7 g/dL (ref 0.4–1.8)
Globulin, Total: 3.6 g/dL (ref 2.2–3.9)
Total Protein ELP: 7.5 g/dL (ref 6.0–8.5)

## 2023-12-15 NOTE — Patient Instructions (Signed)
[facility] **VISIT SUMMARY & IMPORTANT INSTRUCTIONS **   You were seen today by Rojelio Brenner PA-C for your follow-up visit.    ABNORMAL IMMUNOGLOBULINS: You were seen today due to elevated immunoglobulin light chain proteins.  This does not appear to be causing any problems at this time, but is most likely a sign of a precancerous state called "MGUS."  MGUS can progress to a type of cancer called multiple myeloma in some patients.  We will continue to monitor these labs at your follow-up visit in 6 months to make sure you do not have any evidence of progressing to myeloma cancer.  We will check a 24-hour urine study.  Kit has been provided for you. FIRST MORNING: Discard first urine of the morning into the toilet. Collect the rest of your urine in the orange jug for the next 24 hours. SECOND MORNING: Collect your first urine of the morning in the jug.  This ends your 24-hour urine collection. **Store the urine jug in the refrigerator but is not being used. Return urine jug to fourth floor front desk as soon as it is completed.   HISTORY OF THYMIC CANCER: You are overdue for CT scan of your chest, which we will continue to check once a year.  SMOKING: Please see the attached handout for tips that will help you to quit smoking! [facility] offers free classes to help you quit smoking.  You can find out about this by going online and searching for "[place] Quit Smart."   OTHER TESTS: - CT scan of your chest - 24-hour urine study  MEDICATIONS: No changes to home medications  FOLLOW-UP APPOINTMENT: Office visit in 6 months (1 week after labs)  ** Thank you for trusting me with your healthcare!  I strive to provide all of my patients with quality care at each visit.  If you receive a survey for this visit, I would be so grateful to you for taking the time to provide feedback.  Thank you in advance!  ~ Isom Kochan                   Dr. Doreatha Massed   &   Rojelio Brenner, PA-C   - - - - - - - - - - - - - - - - - -    Thank you for choosing [facility] at [facility] to provide your oncology and hematology care.  To afford each patient quality time with our provider, please arrive at least 15 minutes before your scheduled appointment time.   If you have a lab appointment with the Cancer Center please come in thru the Main Entrance and check in at the main information desk.  You need to re-schedule your appointment should you arrive 10 or more minutes late.  We strive to give you quality time with our providers, and arriving late affects you and other patients whose appointments are after yours.  Also, if you no show three or more times for appointments you may be dismissed from the clinic at the providers discretion.     Again, thank you for choosing [facility].  Our hope is that these requests will decrease the amount of time that you wait before being seen by our physicians.       _____________________________________________________________  Should you have questions after your visit to [facility], please contact our office at [phone] and  follow the prompts.  Our office hours are 8:00 a.m. and 4:30 p.m. Monday - Friday.  Please note that voicemails left after 4:00 p.m. may not be returned until the following business day.  We are closed weekends and major holidays.  You do have access to a nurse 24-7, just call the main number to the clinic [phone] and do not press any options, hold on the line and a nurse will answer the phone.    For prescription refill requests, have your pharmacy contact our office and allow 72 hours.

## 2023-12-21 LAB — IMMUNOFIXATION ELECTROPHORESIS
IgA: 274 mg/dL (ref 61–437)
IgG (Immunoglobin G), Serum: 1862 mg/dL — ABNORMAL HIGH (ref 603–1613)
IgM (Immunoglobulin M), Srm: 46 mg/dL (ref 20–172)
Total Protein ELP: 7.6 g/dL (ref 6.0–8.5)

## 2023-12-29 ENCOUNTER — Ambulatory Visit: Payer: Medicare PPO | Admitting: Cardiology

## 2023-12-29 ENCOUNTER — Ambulatory Visit: Payer: Medicare PPO | Admitting: Podiatry

## 2024-01-06 ENCOUNTER — Ambulatory Visit: Payer: Medicare PPO | Admitting: Podiatry

## 2024-01-06 ENCOUNTER — Other Ambulatory Visit: Payer: Self-pay | Admitting: Podiatry

## 2024-01-06 ENCOUNTER — Encounter: Payer: Self-pay | Admitting: Podiatry

## 2024-01-06 ENCOUNTER — Ambulatory Visit (INDEPENDENT_AMBULATORY_CARE_PROVIDER_SITE_OTHER): Payer: Medicare PPO

## 2024-01-06 VITALS — Ht 72.0 in | Wt 245.0 lb

## 2024-01-06 DIAGNOSIS — B351 Tinea unguium: Secondary | ICD-10-CM

## 2024-01-06 DIAGNOSIS — S91109A Unspecified open wound of unspecified toe(s) without damage to nail, initial encounter: Secondary | ICD-10-CM

## 2024-01-06 DIAGNOSIS — B353 Tinea pedis: Secondary | ICD-10-CM

## 2024-01-06 DIAGNOSIS — M79675 Pain in left toe(s): Secondary | ICD-10-CM

## 2024-01-06 DIAGNOSIS — M79674 Pain in right toe(s): Secondary | ICD-10-CM | POA: Diagnosis not present

## 2024-01-06 DIAGNOSIS — M79671 Pain in right foot: Secondary | ICD-10-CM

## 2024-01-06 MED ORDER — TERBINAFINE HCL 250 MG PO TABS: 250.0000 mg | ORAL_TABLET | Freq: Every day | ORAL | 0 refills | Status: DC

## 2024-01-06 MED ORDER — MUPIROCIN 2 % EX OINT: 1.0000 | TOPICAL_OINTMENT | Freq: Two times a day (BID) | CUTANEOUS | 0 refills | Status: AC

## 2024-01-06 NOTE — Progress Notes (Signed)
  Subjective:  Patient ID: Derrick Gaines, male    DOB: March 12, 1956,  MRN: [id number]  Chief Complaint  Patient presents with   Nail Problem    He is here to establish and have a nail trim,     68 y.o. male presents with the above complaint. History confirmed with patient. Patient presenting with pain related to dystrophic thickened elongated nails. Patient is unable to trim own nails related to nail dystrophy and/or mobility issues. Patient does not have a history of T2DM.  No reported calluses.  Patient does report significant pain to the nails when they are overgrown ingrowing into the skin.  Objective:  Physical Exam: warm to cool, good capillary refill nail exam onychomycosis of the toenails, onycholysis, dystrophic nails, and overgrown rams horn appearance growing into the skin, wound is present to the right second toe where the nail is growing in.  Nail plate separation bilateral great toenails. DP pulses palpable, PT pulses palpable, and protective sensation intact Left Foot:  Pain with palpation of nails due to elongation and dystrophic growth.  Lesser digits and first toe macerated mycotic appearing skin infection to the distal tufts of the toes distally which were covered by the toenails Right Foot: Pain with palpation of nails due to elongation and dystrophic growth.  Great toe and lesser digits macerated mycotic appearing skin infection to the distal tufts of the toes at site of overgrown toenails.  There is wound present to the second toe where the nail was overgrowing with fat layer exposed.  Assessment:   1. Open wound of toe, initial encounter   2. Pain due to onychomycosis of toenails of both feet   3. Tinea pedis of both feet      Plan:  Patient was evaluated and treated and all questions answered.  #Onychomycosis with pain  -Nails palliatively debrided as below. -Educated on self-care  # Tinea pedis -Starting patient on 3 weeks of oral terbinafine   # Second toe  wound from overgrown nail right foot -Bacitracin and bandage applied today - Mupirocin  prescribed to the patient to be applied daily  Procedure: Nail Debridement Rationale: Pain Type of Debridement: manual, sharp debridement. Instrumentation: Nail nipper, rotary burr. Number of Nails: 10  Return in about 2 weeks (around 01/20/2024) for Right 2nd toe wound check, tinea pedis.         Ethan Saddler, DPM [facility] / [facility]

## 2024-01-11 ENCOUNTER — Ambulatory Visit: Payer: Medicare PPO | Attending: Cardiology | Admitting: Cardiology

## 2024-01-11 ENCOUNTER — Encounter: Payer: Self-pay | Admitting: Cardiology

## 2024-01-11 VITALS — BP 130/84 | HR 77 | Ht 72.0 in | Wt 229.8 lb

## 2024-01-11 DIAGNOSIS — I10 Essential (primary) hypertension: Secondary | ICD-10-CM

## 2024-01-11 DIAGNOSIS — R7989 Other specified abnormal findings of blood chemistry: Secondary | ICD-10-CM

## 2024-01-11 DIAGNOSIS — E782 Mixed hyperlipidemia: Secondary | ICD-10-CM | POA: Diagnosis not present

## 2024-01-11 NOTE — Patient Instructions (Signed)
 Medication Instructions:  Your physician recommends that you continue on your current medications as directed. Please refer to the Current Medication list given to you today.  *If you need a refill on your cardiac medications before your next appointment, please call your pharmacy*   Lab Work: None If you have labs (blood work) drawn today and your tests are completely normal, you will receive your results only by: MyChart Message (if you have MyChart) OR A paper copy in the mail If you have any lab test that is abnormal or we need to change your treatment, we will call you to review the results.   Testing/Procedures: None   Follow-Up: At [facility], you and your health needs are our priority.  As part of our continuing mission to provide you with exceptional heart care, we have created designated Provider Care Teams.  These Care Teams include your primary Cardiologist (physician) and Advanced Practice Providers (APPs -  Physician Assistants and Nurse Practitioners) who all work together to provide you with the care you need, when you need it.  We recommend signing up for the patient portal called "MyChart".  Sign up information is provided on this After Visit Summary.  MyChart is used to connect with patients for Virtual Visits (Telemedicine).  Patients are able to view lab/test results, encounter notes, upcoming appointments, etc.  Non-urgent messages can be sent to your provider as well.   To learn more about what you can do with MyChart, go to [email or web address].    Your next appointment:   6 month(s)  Provider:   You may see Dina Rich, MD or one of the following Advanced Practice Providers on your designated Care Team:   Randall An, PA-C  Jacolyn Reedy, [place]     Other Instructions

## 2024-01-11 NOTE — Progress Notes (Signed)
 Clinical Summary Derrick Gaines is a 68 y.o.male seen today for follow up of the following medical problems.   1.Elevated troponin - admission 04/2023 with elevated trops in absence of cardiopulmonary symptoms - significant HTN on presentation, thought possible related - Trop 800-->702-->671 - EKG SR, no acute ischemic changes - Echo normal LVEF without wall motion abnormalities   - was to have outpatient stress test but appears never scheduled - no chest pains, SOB/DOE   2. HTN - compliant with meds   3. HLD - 04/2023 TC 118 TG 151 HDL 31 LDL 57   4.Dementia - chronic memory impairment    5. Remote CVA    Past Medical History:  Diagnosis Date   Arthritis    Cancer (HCC)    Chronic kidney disease    Hypertension    Stroke (HCC)    mild left sided weakness   Vocal cord polyp      No Known Allergies   Current Outpatient Medications  Medication Sig Dispense Refill   amLODipine  (NORVASC ) 10 MG tablet Take 1 tablet (10 mg total) by mouth daily. 90 tablet 1   aspirin  EC 81 MG tablet Take 81 mg by mouth daily. Swallow whole.     atorvastatin  (LIPITOR) 40 MG tablet Take 1 tablet (40 mg total) by mouth daily. 90 tablet 1   hydrochlorothiazide  (HYDRODIURIL ) 12.5 MG tablet Take 1 tablet (12.5 mg total) by mouth daily. 90 tablet 1   losartan  (COZAAR ) 25 MG tablet Take 1 tablet (25 mg total) by mouth daily. 90 tablet 1   mupirocin  ointment (BACTROBAN ) 2 % Apply 1 Application topically 2 (two) times daily for 14 days. 28 g 0   terbinafine  (LAMISIL ) 250 MG tablet Take 1 tablet (250 mg total) by mouth daily for 21 days. 21 tablet 0   No current facility-administered medications for this visit.     Past Surgical History:  Procedure Laterality Date   BACK SURGERY     MICROLARYNGOSCOPY N/A 08/29/2018   Procedure: DIRECT MICROLARYNGOSCOPY WITH EXCISION OF LARYNGEAL MASS;  Surgeon: Reynold Caves, MD;  Location: [facility];  Service: ENT;  Laterality: N/A;    STERNOTOMY     THYMECTOMY       No Known Allergies    Family History  Problem Relation Age of Onset   Diabetes Mother    Cancer Mother    Colon cancer Sister    Pneumonia Father    Emphysema Brother      Social History Derrick Gaines reports that he has been smoking cigarettes. He has a 15 pack-year smoking history. He has never used smokeless tobacco. Derrick Gaines reports no history of alcohol  use.   Review of Systems CONSTITUTIONAL: No weight loss, fever, chills, weakness or fatigue.  HEENT: Eyes: No visual loss, blurred vision, double vision or yellow sclerae.No hearing loss, sneezing, congestion, runny nose or sore throat.  SKIN: No rash or itching.  CARDIOVASCULAR: per hpi RESPIRATORY: No shortness of breath, cough or sputum.  GASTROINTESTINAL: No anorexia, nausea, vomiting or diarrhea. No abdominal pain or blood.  GENITOURINARY: No burning on urination, no polyuria NEUROLOGICAL: No headache, dizziness, syncope, paralysis, ataxia, numbness or tingling in the extremities. No change in bowel or bladder control.  MUSCULOSKELETAL: No muscle, back pain, joint pain or stiffness.  LYMPHATICS: No enlarged nodes. No history of splenectomy.  PSYCHIATRIC: No history of depression or anxiety.  ENDOCRINOLOGIC: No reports of sweating, cold or heat intolerance. No polyuria or polydipsia.  Derrick Gaines  Physical Examination Today's Vitals   01/11/24 1047  BP: 130/84  Pulse: 77  SpO2: 98%  Weight: 229 lb 12.8 oz (104.2 kg)  Height: 6' (1.829 m)   Body mass index is 31.17 kg/m.  Gen: resting comfortably, no acute distress HEENT: no scleral icterus, pupils equal round and reactive, no palptable cervical adenopathy,  CV: RRR, no m/rg, no jvd Resp: Clear to auscultation bilaterally GI: abdomen is soft, non-tender, non-distended, normal bowel sounds, no hepatosplenomegaly MSK: extremities are warm, no edema.  Skin: warm, no rash Neuro:  no focal deficits Psych: appropriate  affect     Assessment and Plan   1.Elevated troponin - in setting of HTN during 04/2023 admission - was to have outpatient stress test but never completed - at no point has he had any cardiopulmonary symptoms. EKG and echo have been benign - 7 months now since he was admitted, in absence of symptoms and with developing dementia/memory impairment will not reorder stress test at this time  2. HTN - at goal, continue current meds  3. HLD - at goal, continue current meds  F/u 6 months     Laurann Pollock, M.D.,

## 2024-01-19 ENCOUNTER — Ambulatory Visit (HOSPITAL_COMMUNITY)
Admission: RE | Admit: 2024-01-19 | Discharge: 2024-01-19 | Disposition: A | Discharge status: Home / Self Care | Payer: Medicare PPO | Source: Ambulatory Visit | Attending: Physician Assistant | Admitting: Physician Assistant

## 2024-01-19 DIAGNOSIS — R918 Other nonspecific abnormal finding of lung field: Secondary | ICD-10-CM | POA: Diagnosis not present

## 2024-01-19 DIAGNOSIS — J439 Emphysema, unspecified: Secondary | ICD-10-CM | POA: Diagnosis not present

## 2024-01-19 DIAGNOSIS — I700 Atherosclerosis of aorta: Secondary | ICD-10-CM | POA: Diagnosis not present

## 2024-01-19 DIAGNOSIS — D384 Neoplasm of uncertain behavior of thymus: Secondary | ICD-10-CM | POA: Diagnosis not present

## 2024-01-19 DIAGNOSIS — M47814 Spondylosis without myelopathy or radiculopathy, thoracic region: Secondary | ICD-10-CM | POA: Insufficient documentation

## 2024-01-19 DIAGNOSIS — C37 Malignant neoplasm of thymus: Secondary | ICD-10-CM | POA: Diagnosis not present

## 2024-01-19 DIAGNOSIS — D4989 Neoplasm of unspecified behavior of other specified sites: Secondary | ICD-10-CM | POA: Diagnosis present

## 2024-01-19 DIAGNOSIS — N281 Cyst of kidney, acquired: Secondary | ICD-10-CM | POA: Insufficient documentation

## 2024-01-19 DIAGNOSIS — R911 Solitary pulmonary nodule: Secondary | ICD-10-CM | POA: Insufficient documentation

## 2024-01-19 DIAGNOSIS — I2510 Atherosclerotic heart disease of native coronary artery without angina pectoris: Secondary | ICD-10-CM | POA: Insufficient documentation

## 2024-01-19 DIAGNOSIS — J929 Pleural plaque without asbestos: Secondary | ICD-10-CM | POA: Diagnosis not present

## 2024-01-19 MED ORDER — IOHEXOL 300 MG/ML  SOLN
75.0000 mL | Freq: Once | INTRAMUSCULAR | Status: AC | PRN
  Administered 2024-01-19: 75 mL via INTRAVENOUS

## 2024-01-20 ENCOUNTER — Encounter: Payer: Self-pay | Admitting: Podiatry

## 2024-01-20 ENCOUNTER — Ambulatory Visit (INDEPENDENT_AMBULATORY_CARE_PROVIDER_SITE_OTHER): Payer: Medicare PPO | Admitting: Podiatry

## 2024-01-20 DIAGNOSIS — B353 Tinea pedis: Secondary | ICD-10-CM

## 2024-01-20 NOTE — Progress Notes (Unsigned)
  Subjective:  Patient ID: Derrick Gaines, male    DOB: [birth year],  MRN: [id number]  Chief Complaint  Patient presents with   Wound Check    RT 2nd toe. No open wound. Patient says he believes it has healed. Using the mupirocin & Bactroban oint, which is helping. He is also taking the terbinafine and believes it is helping as well.     68 y.o. male presents with the above complaint. History confirmed with patient.  Following up for right second toe check.  He had a wound here secondary to overgrown nails.  This appears improved.  He has been using the mupirocin ointment.  He is also been taking the oral terbinafine for tinea pedis.  Objective:  Physical Exam: warm to cool, good capillary refill nail exam onychomycosis of the toenails, onycholysis, dystrophic nails, Nail plate separation bilateral great toenails. DP pulses palpable, PT pulses palpable, and protective sensation intact Left Foot:  Pain with palpation of nails due to elongation and dystrophic growth.  Right Foot: Pain with palpation of nails due to elongation and dystrophic growth.  Second toe wound appears healed at this point Maceration to the toes and interspaces appears improving.  Assessment:   1. Tinea pedis of both feet       Plan:  Patient was evaluated and treated and all questions answered.  # Tinea pedis -Complete course of oral terbinafine -Overall appears improved -Discussed importance of continued pedal hygiene -Right second toe wound appears well-healed   Return in about 8 weeks (around 03/16/2024) for Routine Foot Care.         Bronwen Betters, DPM [facility] / [facility]

## 2024-02-01 ENCOUNTER — Telehealth: Payer: Self-pay | Admitting: Physician Assistant

## 2024-02-01 DIAGNOSIS — R9389 Abnormal findings on diagnostic imaging of other specified body structures: Secondary | ICD-10-CM

## 2024-02-01 DIAGNOSIS — R911 Solitary pulmonary nodule: Secondary | ICD-10-CM

## 2024-02-01 NOTE — Telephone Encounter (Signed)
 Called and spoke to wife.  Verbalized understanding.

## 2024-02-01 NOTE — Telephone Encounter (Signed)
 Attempted to reach patient to discuss results of his recent CT chest (01/19/2024), which did not show any evidence of mediastinal mass or adenopathy.  However, there was some slow growth of pleural-based nodule of the left lung apex without aggressive characteristics.  Per radiologist, differential considerations remain neurogenic lesion, solitary fibrous tumor of the thorax, or noncalcified pleural plaque.  Recommended to schedule for MRI of thoracic spine with/without IV contrast.  Left message requesting return call and we will continue to try to reach patient.  Pleasant CHRISTELLA Barefoot, PA-C 02/01/24 1:10 PM

## 2024-02-03 ENCOUNTER — Other Ambulatory Visit: Payer: Self-pay | Admitting: Podiatry

## 2024-02-06 ENCOUNTER — Ambulatory Visit (HOSPITAL_COMMUNITY)
Admission: RE | Admit: 2024-02-06 | Discharge: 2024-02-06 | Disposition: A | Discharge status: Home / Self Care | Payer: Medicare PPO | Source: Ambulatory Visit | Attending: Physician Assistant | Admitting: Physician Assistant

## 2024-02-06 DIAGNOSIS — M5134 Other intervertebral disc degeneration, thoracic region: Secondary | ICD-10-CM | POA: Diagnosis not present

## 2024-02-06 DIAGNOSIS — R911 Solitary pulmonary nodule: Secondary | ICD-10-CM | POA: Insufficient documentation

## 2024-02-06 DIAGNOSIS — R9389 Abnormal findings on diagnostic imaging of other specified body structures: Secondary | ICD-10-CM | POA: Diagnosis not present

## 2024-02-06 MED ORDER — GADOBUTROL 1 MMOL/ML IV SOLN
10.0000 mL | Freq: Once | INTRAVENOUS | Status: AC | PRN
  Administered 2024-02-06: 10 mL via INTRAVENOUS

## 2024-02-15 IMAGING — US US RENAL
1 series · 14 of 25 positions shown · non-contrast
Comparison: February 15, 2022.

CLINICAL DATA: Chronic kidney disease stage 3 B.

EXAM:
RENAL / URINARY TRACT ULTRASOUND COMPLETE

[Series 1: us renal · 14 of 52 slices shown]
[im 1/52]
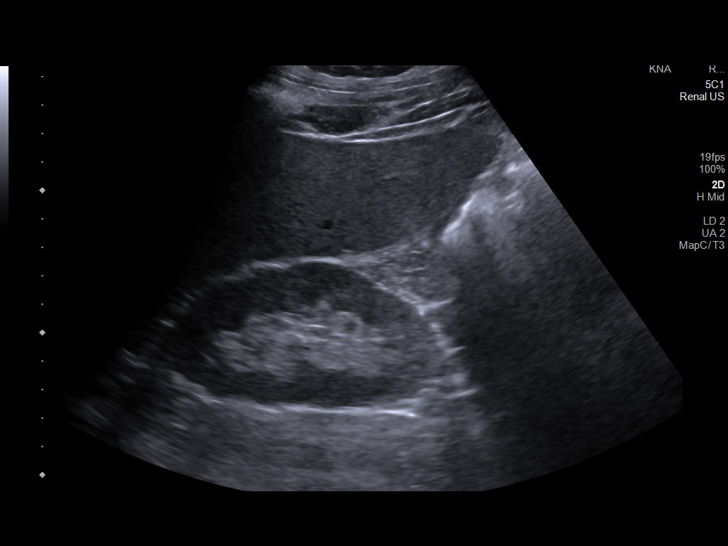
[im 5/52]
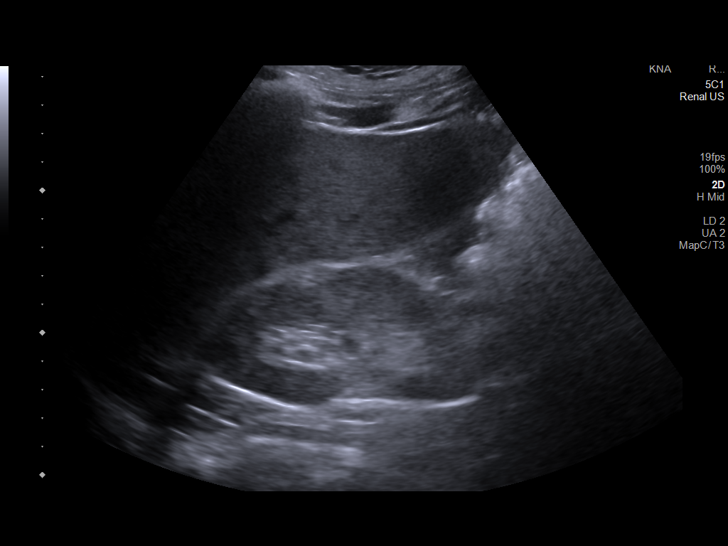
[im 9/52]
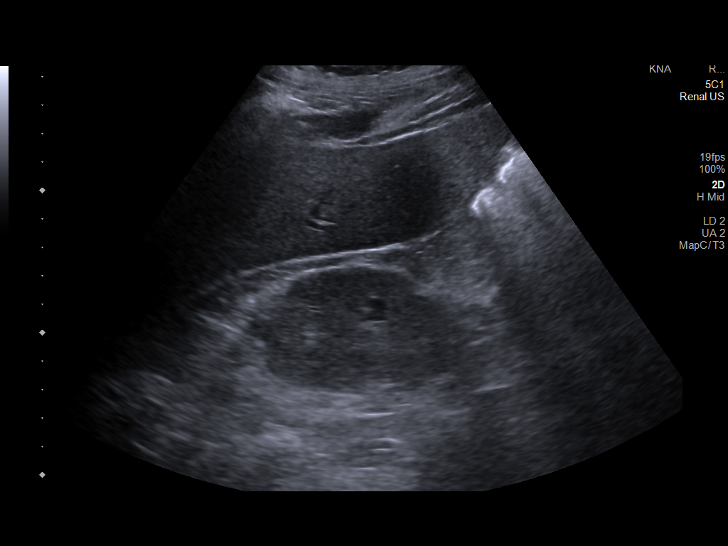
[im 13/52]
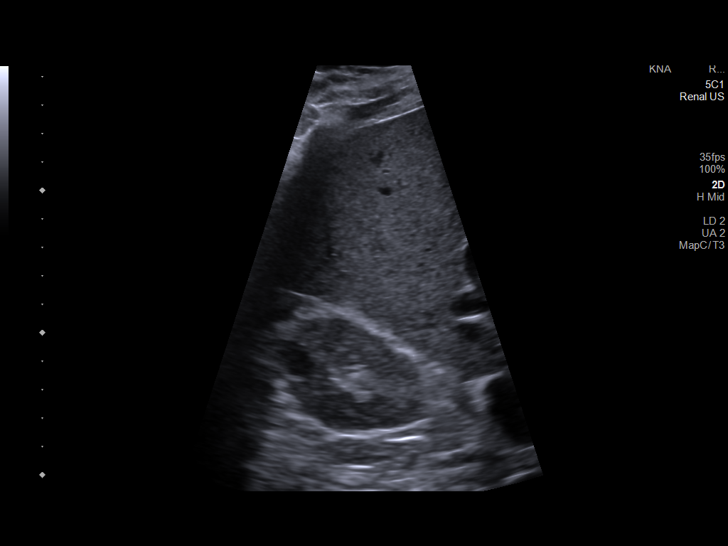
[im 18/52]
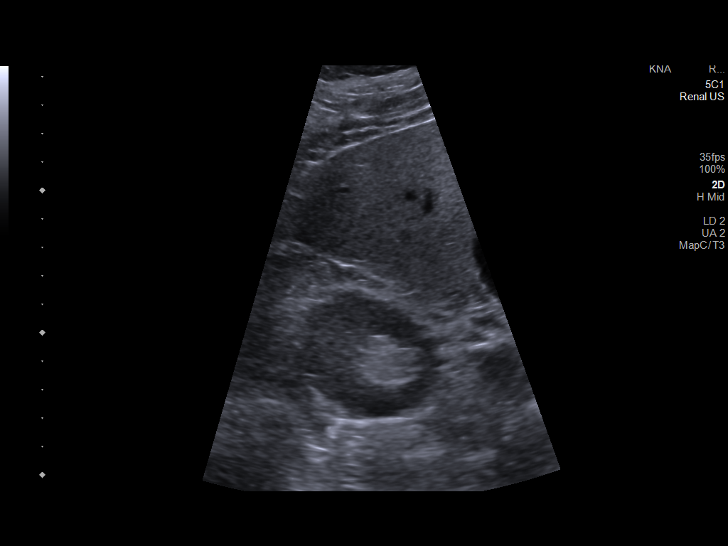
[im 20/52]
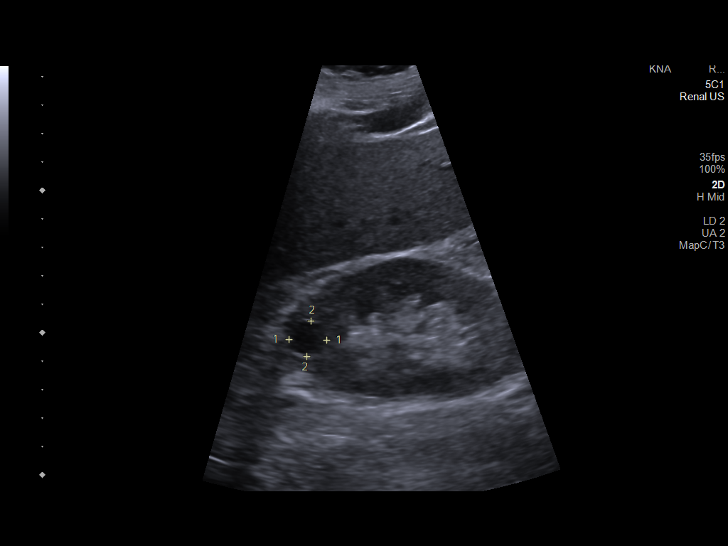
[im 24/52]
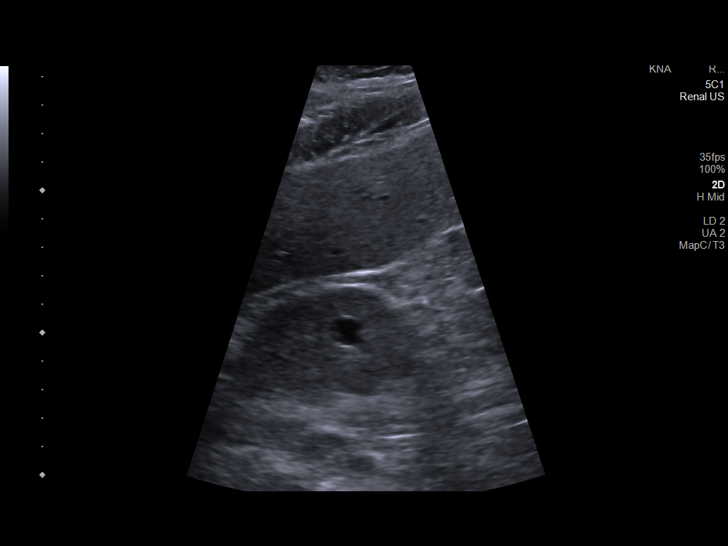
[im 28/52]
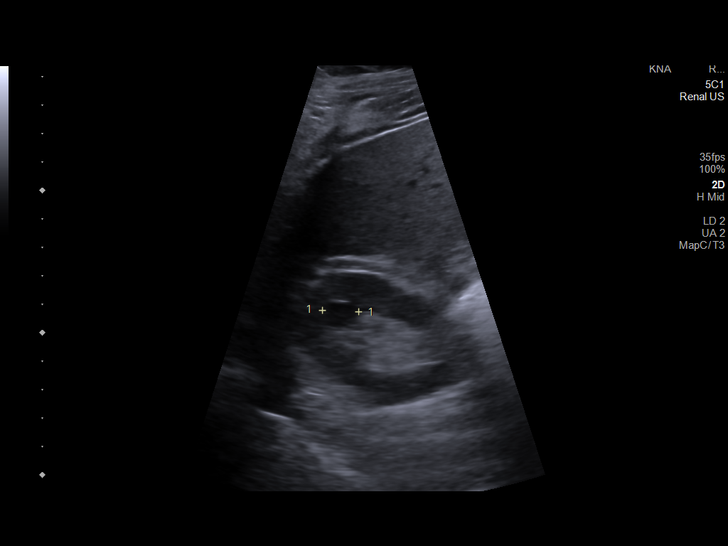
[im 32/52]
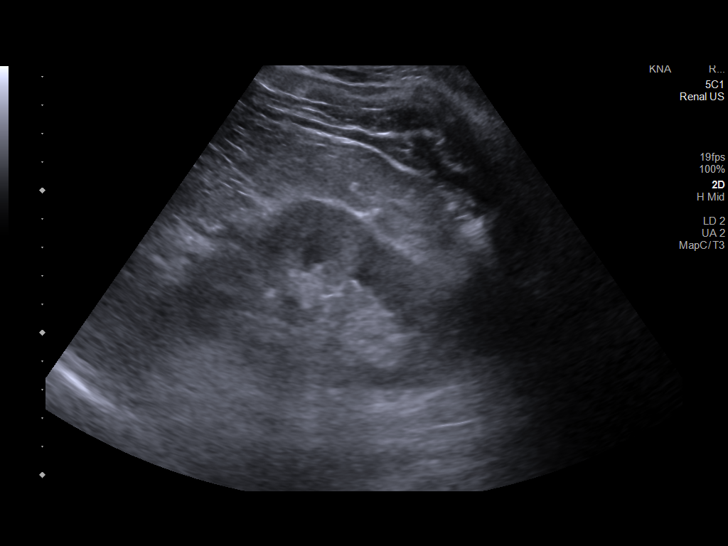
[im 35/52]
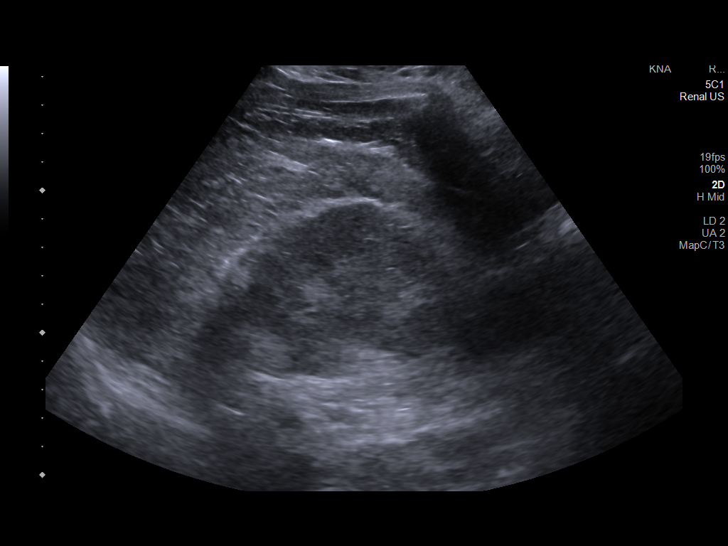
[im 39/52]
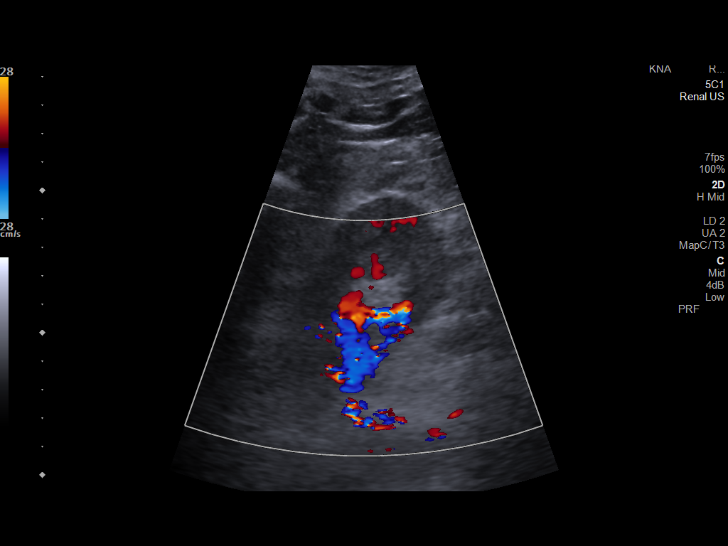
[im 43/52]
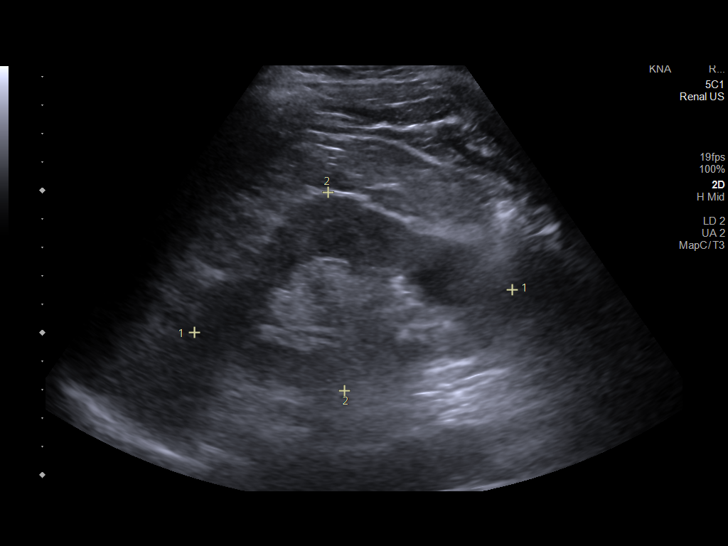
[im 47/52]
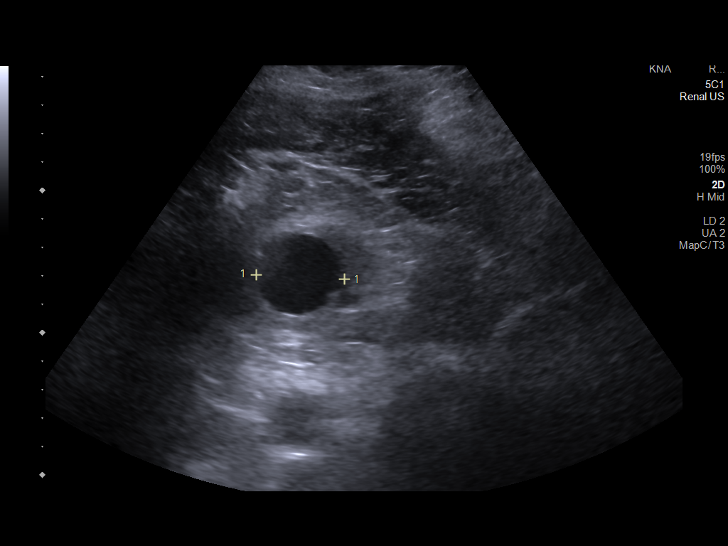
[im 52/52]
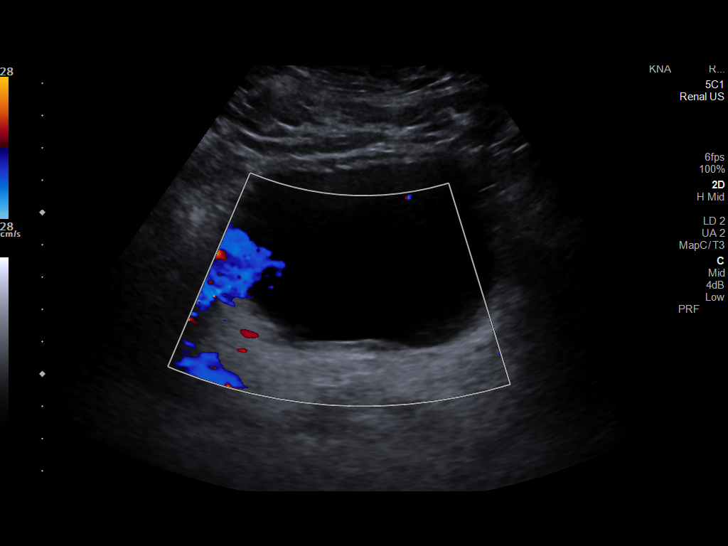

[14 of 25 positions shown; findings below may reference images not displayed]

FINDINGS: Right Kidney:

Renal measurements: 10.2 x 5.2 x 4.5 cm = volume: 126 mL. Two simple
cysts are noted, the largest measuring 1.3 cm; no further follow-up
is required. Echogenicity within normal limits. No mass or
hydronephrosis visualized.

Left Kidney:

Renal measurements: 11.3 x 7.0 x 5.3 cm = volume: 217 mL. 3.1 cm
simple cyst is noted; no further follow-up is required. Echogenicity
within normal limits. No mass or hydronephrosis visualized.

Bladder:

Appears normal for degree of bladder distention.

Other:

None.
IMPRESSION: No significant renal abnormality is noted.

## 2024-02-21 DIAGNOSIS — Z1159 Encounter for screening for other viral diseases: Secondary | ICD-10-CM | POA: Diagnosis not present

## 2024-02-21 DIAGNOSIS — E782 Mixed hyperlipidemia: Secondary | ICD-10-CM | POA: Diagnosis not present

## 2024-02-21 DIAGNOSIS — R739 Hyperglycemia, unspecified: Secondary | ICD-10-CM | POA: Diagnosis not present

## 2024-02-21 DIAGNOSIS — R269 Unspecified abnormalities of gait and mobility: Secondary | ICD-10-CM | POA: Diagnosis not present

## 2024-02-21 DIAGNOSIS — R413 Other amnesia: Secondary | ICD-10-CM | POA: Diagnosis not present

## 2024-02-21 DIAGNOSIS — Z8673 Personal history of transient ischemic attack (TIA), and cerebral infarction without residual deficits: Secondary | ICD-10-CM | POA: Diagnosis not present

## 2024-02-21 DIAGNOSIS — E559 Vitamin D deficiency, unspecified: Secondary | ICD-10-CM | POA: Diagnosis not present

## 2024-02-21 DIAGNOSIS — I10 Essential (primary) hypertension: Secondary | ICD-10-CM | POA: Diagnosis not present

## 2024-02-21 DIAGNOSIS — Z125 Encounter for screening for malignant neoplasm of prostate: Secondary | ICD-10-CM | POA: Diagnosis not present

## 2024-02-22 ENCOUNTER — Telehealth: Payer: Self-pay | Admitting: Physician Assistant

## 2024-02-22 DIAGNOSIS — R911 Solitary pulmonary nodule: Secondary | ICD-10-CM

## 2024-02-22 DIAGNOSIS — R9389 Abnormal findings on diagnostic imaging of other specified body structures: Secondary | ICD-10-CM

## 2024-02-22 LAB — CMP14+EGFR
ALT: 9 [IU]/L (ref 0–44)
AST: 14 [IU]/L (ref 0–40)
Albumin: 4.1 g/dL (ref 3.9–4.9)
Alkaline Phosphatase: 85 [IU]/L (ref 44–121)
BUN/Creatinine Ratio: 14 (ref 10–24)
BUN: 18 mg/dL (ref 8–27)
Bilirubin Total: 0.4 mg/dL (ref 0.0–1.2)
CO2: 23 mmol/L (ref 20–29)
Calcium: 9.2 mg/dL (ref 8.6–10.2)
Chloride: 103 mmol/L (ref 96–106)
Creatinine, Ser: 1.3 mg/dL — ABNORMAL HIGH (ref 0.76–1.27)
Globulin, Total: 3.2 g/dL (ref 1.5–4.5)
Glucose: 91 mg/dL (ref 70–99)
Potassium: 3.7 mmol/L (ref 3.5–5.2)
Sodium: 140 mmol/L (ref 134–144)
Total Protein: 7.3 g/dL (ref 6.0–8.5)
eGFR: 61 mL/min/{1.73_m2} (ref >59)

## 2024-02-22 LAB — CBC WITH DIFFERENTIAL/PLATELET
Basophils Absolute: 0.1 10*3/uL (ref 0.0–0.2)
Basos: 1 %
EOS (ABSOLUTE): 0.2 10*3/uL (ref 0.0–0.4)
Eos: 3 %
Hematocrit: 44.6 % (ref 37.5–51.0)
Hemoglobin: 15 g/dL (ref 13.0–17.7)
Immature Grans (Abs): 0 10*3/uL (ref 0.0–0.1)
Immature Granulocytes: 1 %
Lymphocytes Absolute: 2.8 10*3/uL (ref 0.7–3.1)
Lymphs: 43 %
MCH: 33 pg (ref 26.6–33.0)
MCHC: 33.6 g/dL (ref 31.5–35.7)
MCV: 98 fL — ABNORMAL HIGH (ref 79–97)
Monocytes Absolute: 0.6 10*3/uL (ref 0.1–0.9)
Monocytes: 10 %
Neutrophils Absolute: 2.9 10*3/uL (ref 1.4–7.0)
Neutrophils: 42 %
Platelets: 337 10*3/uL (ref 150–450)
RBC: 4.55 x10E6/uL (ref 4.14–5.80)
RDW: 11.7 % (ref 11.6–15.4)
WBC: 6.6 10*3/uL (ref 3.4–10.8)

## 2024-02-22 LAB — HEMOGLOBIN A1C
Est. average glucose Bld gHb Est-mCnc: 114 mg/dL
Hgb A1c MFr Bld: 5.6 % (ref 4.8–5.6)

## 2024-02-22 LAB — PSA: Prostate Specific Ag, Serum: 3.5 ng/mL (ref 0.0–4.0)

## 2024-02-22 LAB — HEPATITIS C ANTIBODY: Hep C Virus Ab: NONREACTIVE

## 2024-02-22 LAB — LIPID PANEL
Chol/HDL Ratio: 2.9 {ratio} (ref 0.0–5.0)
Cholesterol, Total: 116 mg/dL (ref 100–199)
HDL: 40 mg/dL (ref >39)
LDL Chol Calc (NIH): 51 mg/dL (ref 0–99)
Triglycerides: 145 mg/dL (ref 0–149)
VLDL Cholesterol Cal: 25 mg/dL (ref 5–40)

## 2024-02-22 LAB — VITAMIN B12: Vitamin B-12: 719 pg/mL (ref 232–1245)

## 2024-02-22 LAB — TSH: TSH: 1.63 u[IU]/mL (ref 0.450–4.500)

## 2024-02-22 LAB — VITAMIN D 25 HYDROXY (VIT D DEFICIENCY, FRACTURES): Vit D, 25-Hydroxy: 22.9 ng/mL — ABNORMAL LOW (ref 30.0–100.0)

## 2024-02-22 NOTE — Telephone Encounter (Signed)
 I have reviewed results of MRI thoracic spine (performed 02/06/2024, resulted on 02/20/2024) with Dr. Ellin Saba.  Presence of pleural-based nodule of posterior left hemithorax was confirmed, measuring 1.9 x 1.0 cm.  No invasion into the paraspinous structures.  Per discussion with Dr. Ellin Saba, we will check PET scan and consider biopsy if FDG avid lesion.  Spoke with patient at 3:55 PM on 02/22/2024 to relay the above information.  Patient verbalizes understanding and acceptance of the above plan.  We will proceed with scheduling PET scan.  Carnella Guadalajara, PA-C 02/22/24 4:05 PM

## 2024-02-24 ENCOUNTER — Encounter: Payer: Self-pay | Admitting: Internal Medicine

## 2024-02-24 ENCOUNTER — Ambulatory Visit (INDEPENDENT_AMBULATORY_CARE_PROVIDER_SITE_OTHER): Payer: Medicare PPO | Admitting: Internal Medicine

## 2024-02-24 VITALS — BP 131/81 | HR 100 | Ht 72.0 in | Wt 232.2 lb

## 2024-02-24 DIAGNOSIS — F01C18 Vascular dementia, severe, with other behavioral disturbance: Secondary | ICD-10-CM | POA: Diagnosis not present

## 2024-02-24 DIAGNOSIS — F17210 Nicotine dependence, cigarettes, uncomplicated: Secondary | ICD-10-CM

## 2024-02-24 DIAGNOSIS — E538 Deficiency of other specified B group vitamins: Secondary | ICD-10-CM | POA: Diagnosis not present

## 2024-02-24 DIAGNOSIS — F3341 Major depressive disorder, recurrent, in partial remission: Secondary | ICD-10-CM | POA: Diagnosis not present

## 2024-02-24 DIAGNOSIS — Z8673 Personal history of transient ischemic attack (TIA), and cerebral infarction without residual deficits: Secondary | ICD-10-CM

## 2024-02-24 DIAGNOSIS — I10 Essential (primary) hypertension: Secondary | ICD-10-CM

## 2024-02-24 DIAGNOSIS — Z720 Tobacco use: Secondary | ICD-10-CM | POA: Diagnosis not present

## 2024-02-24 DIAGNOSIS — N1831 Chronic kidney disease, stage 3a: Secondary | ICD-10-CM

## 2024-02-24 DIAGNOSIS — E519 Thiamine deficiency, unspecified: Secondary | ICD-10-CM | POA: Insufficient documentation

## 2024-02-24 MED ORDER — DULOXETINE HCL 30 MG PO CPEP: 30.0000 mg | ORAL_CAPSULE | Freq: Every day | ORAL | 5 refills | Status: DC

## 2024-02-24 MED ORDER — THIAMINE MONONITRATE 100 MG PO TABS: 100.0000 mg | ORAL_TABLET | Freq: Every day | ORAL | 3 refills | Status: AC

## 2024-02-24 NOTE — Assessment & Plan Note (Signed)
 Lab Results  Component Value Date   VITAMINB12 719 02/21/2024   Improved now On oral Vit B12 1000 mcg once daily

## 2024-02-24 NOTE — Assessment & Plan Note (Addendum)
 MRI brain reviewed MoCA: 9/30 during neurology evaluation - was not deemed appropriate for antidementia medicines due to current advanced dementia Needs to be compliant to HTN medicines Had low B12 and B1 levels - on supplements now, needs to start thiamine supplement instead of riboflavin

## 2024-02-24 NOTE — Assessment & Plan Note (Signed)
 GFR stays around 50-55, last BMP showed GFR of 61 Used to be followed by Dr. Wolfgang Phoenix On [facility] Maintain adequate hydration

## 2024-02-24 NOTE — Patient Instructions (Addendum)
 Please call 1-800-QUIT-NOW [phone]) for smoking cessation supplies.  Please take Vitamin B1 (thiamine) 100 mg once daily and Vitamin D 5000 IU once daily.  Please continue to take medications as prescribed.  Please continue to follow low carb diet and perform moderate exercise/walking at least 150 mins/week.

## 2024-02-24 NOTE — Assessment & Plan Note (Addendum)
 BP Readings from Last 1 Encounters:  02/24/24 131/81   Usually Well-controlled with Losartan 25 mg once daily, Amlodipine 10 mg QD and hydrochlorothiazide 12.5 mg once daily Counseled for compliance with the medications Advised DASH diet and moderate exercise/walking, at least 150 mins/week

## 2024-02-24 NOTE — Assessment & Plan Note (Signed)
 Was on Cymbalta in the past, but was discontinued in the last visit as he did not have symptoms of depression He has hypersomnolence since stopping Cymbalta, could be due to alcohol and marijuana use as well Needs to avoid substance use including alcohol, tobacco and marijuana Restart Cymbalta at 30 mg QD dose

## 2024-02-24 NOTE — Progress Notes (Addendum)
 New Patient Office Visit  Subjective:  Patient ID: Derrick Gaines, male    DOB: [birth year]  Age: 68 y.o. MRN: [id number]  CC:  Chief Complaint  Patient presents with   Care Management    4 month f/u    HPI Derrick Gaines is a 68 y.o. male with past medical history of HTN, CVA, CKD, HLD, thymoma s/p resection and tobacco abuse who presents for f/u of his chronic medical conditions.  HTN: His BP is WNL.  He currently takes amlodipine  10 mg QD, losartan  25 mg QD and hydrochlorothiazide  12.5 mg once daily. He had hospitalization due to elevated troponin in 05/24, which was deemed to be due to demand ischemia in the setting of hypertensive urgency.  He currently denies any headache, dizziness, chest pain, dyspnea or palpitations.  History of CVA: His wife reports history of CVA, with residual mild right UE weakness.  Patient himself denies any numbness or weakness of the UE or LE.  He has memory deficits for the last few years, which has been progressing.  He has been forgetting streets and his wife has stopped him from driving due to irrational driving.  Chart review suggest that he takes alcohol  with his friends and smokes weed at times.  Denies any other illicit drug use.  His wife also reports that his gait has been disturbed, but denies urinary or stool incontinence.  He had MRI of brain, which showed chronically advanced small vessel disease with progressive encephalomalacia.  He also had neurology evaluation and was told of vascular dementia.  He has started taking vitamin B12 supplement now.  He is getting B2 supplement instead of B1.  CKD: Used to follow up with Dr. Rachele.  He had elevated immunoglobulin light chains and has seen oncology for it.  Denies any dysuria, hematuria or urinary hesitancy or resistance.  History of thymoma s/p resection: He refused chemoradiation.  He is under annual CT chest surveillance.  He smokes 1 pack/day.  Denies any dyspnea or wheezing currently. He is  trying to cut down.  Of note, he has stopped taking Cymbalta  30 mg QD as advised, because he does not know why he takes it.  His wife reports remote history of MDD.  He denies anhedonia, anxiety, SI or HI currently.  His wife has noticed hypersomnolence since stopping Cymbalta .   Past Medical History:  Diagnosis Date   Arthritis    Cancer (HCC)    Chronic kidney disease    Hypertension    Stroke (HCC)    mild left sided weakness   Vocal cord polyp     Past Surgical History:  Procedure Laterality Date   BACK SURGERY     MICROLARYNGOSCOPY N/A 08/29/2018   Procedure: DIRECT MICROLARYNGOSCOPY WITH EXCISION OF LARYNGEAL MASS;  Surgeon: Karis Clunes, MD;  Location: [facility];  Service: ENT;  Laterality: N/A;   STERNOTOMY     THYMECTOMY      Family History  Problem Relation Age of Onset   Diabetes Mother    Cancer Mother    Colon cancer Sister    Pneumonia Father    Emphysema Brother     Social History   Socioeconomic History   Marital status: Married    Spouse name: Not on file   Number of children: Not on file   Years of education: Not on file   Highest education level: Not on file  Occupational History   Not on file  Tobacco Use  Smoking status: Every Day    Current packs/day: 0.50    Average packs/day: 0.5 packs/day for 30.0 years (15.0 ttl pk-yrs)    Types: Cigarettes   Smokeless tobacco: Never   Tobacco comments:    1 ppd as of 02/24/24  Vaping Use   Vaping status: Never Used  Substance and Sexual Activity   Alcohol  use: No   Drug use: No   Sexual activity: Yes  Other Topics Concern   Not on file  Social History Narrative   Been on disability since age 63 due to a stroke.    Used to work at [employer].    Married. No children. Married for 15 years.   Smoke cigarettes, since age 17 years.    No alcohol . No drugs.   Enjoys fishing.    Attends church.   Eats all foods.   Wear seatbelt.    Drives.    Right handed   One story home   Lives  with wife , and son and wifes two sisters   Social Drivers of Corporate investment banker Strain: Not on file  Food Insecurity: No Food Insecurity (04/27/2023)   Hunger Vital Sign    Worried About Running Out of Food in the Last Year: Never true    Ran Out of Food in the Last Year: Never true  Transportation Needs: No Transportation Needs (04/27/2023)   PRAPARE - Administrator, Civil Service (Medical): No    Lack of Transportation (Non-Medical): No  Physical Activity: Not on file  Stress: Not on file  Social Connections: Not on file  Intimate Partner Violence: Not At Risk (04/27/2023)   Humiliation, Afraid, Rape, and Kick questionnaire    Fear of Current or Ex-Partner: No    Emotionally Abused: No    Physically Abused: No    Sexually Abused: No    ROS Review of Systems  Constitutional:  Negative for chills and fever.  HENT:  Negative for congestion and sore throat.   Eyes:  Negative for pain and discharge.  Respiratory:  Negative for cough and shortness of breath.   Cardiovascular:  Negative for chest pain and palpitations.  Gastrointestinal:  Negative for diarrhea, nausea and vomiting.  Endocrine: Negative for polydipsia and polyuria.  Genitourinary:  Negative for dysuria and hematuria.  Musculoskeletal:  Negative for neck pain and neck stiffness.  Skin:  Negative for rash.  Neurological:  Negative for dizziness, weakness, numbness and headaches.  Psychiatric/Behavioral:  Positive for confusion. Negative for agitation and behavioral problems.     Objective:   Today's Vitals: BP 131/81   Pulse 100   Ht 6' (1.829 m)   Wt 232 lb 3.2 oz (105.3 kg)   SpO2 94%   BMI 31.49 kg/m   Physical Exam Vitals reviewed.  Constitutional:      General: He is not in acute distress.    Appearance: He is not diaphoretic.  HENT:     Head: Normocephalic and atraumatic.     Nose: Nose normal.     Mouth/Throat:     Mouth: Mucous membranes are moist.  Eyes:     General: No  scleral icterus.    Extraocular Movements: Extraocular movements intact.  Cardiovascular:     Rate and Rhythm: Normal rate and regular rhythm.     Heart sounds: Normal heart sounds. No murmur heard. Pulmonary:     Breath sounds: Normal breath sounds. No wheezing or rales.  Abdominal:     Palpations: Abdomen is  soft.     Tenderness: There is no abdominal tenderness.  Musculoskeletal:     Cervical back: Neck supple. No tenderness.     Right lower leg: No edema.     Left lower leg: No edema.  Skin:    General: Skin is warm.     Findings: No rash.  Neurological:     General: No focal deficit present.     Mental Status: He is alert and oriented to person, place, and time.     Sensory: No sensory deficit.     Motor: No weakness.     Gait: Gait abnormal.  Psychiatric:        Mood and Affect: Mood normal.        Behavior: Behavior is cooperative.     Assessment & Plan:   Problem List Items Addressed This Visit       Cardiovascular and Mediastinum   Essential hypertension - Primary   BP Readings from Last 1 Encounters:  02/24/24 131/81   Usually Well-controlled with Losartan  25 mg once daily, Amlodipine  10 mg QD and hydrochlorothiazide  12.5 mg once daily Counseled for compliance with the medications Advised DASH diet and moderate exercise/walking, at least 150 mins/week         Nervous and Auditory   Severe vascular dementia (HCC)   MRI brain reviewed MoCA: 9/30 during neurology evaluation - was not deemed appropriate for antidementia medicines due to current advanced dementia Needs to be compliant to HTN medicines Had low B12 and B1 levels - on supplements now, needs to start thiamine  supplement instead of riboflavin      Relevant Medications   DULoxetine  (CYMBALTA ) 30 MG capsule     Genitourinary   CKD (chronic kidney disease) stage 3, GFR 30-59 ml/min (HCC)   GFR stays around 50-55, last BMP showed GFR of 61 Used to be followed by Dr. Rachele On [facility] Maintain  adequate hydration        Other   Tobacco abuse (Chronic)   Smokes about 1 pack/day  Asked about quitting: confirms that he/she currently smokes cigarettes Advise to quit smoking: Educated about QUITTING to reduce the risk of cancer, cardio and cerebrovascular disease. Assess willingness: Unwilling to quit at this time, but is working on cutting back. Assist with counseling and pharmacotherapy: Counseled for 5 minutes and literature provided. Discussed about Wellbutrin, but he needs to be ready to quit. Arrange for follow up: follow up in 3 months and continue to offer help.      History of CVA (cerebrovascular accident)   No residual deficits His memory concern is likely due to dementia versus other structural abnormality On aspirin  and statin Checked MRI brain due to abnormal behavior and gait disturbance      B12 deficiency   Lab Results  Component Value Date   VITAMINB12 719 02/21/2024   Improved now On oral Vit B12 1000 mcg once daily      MDD (major depressive disorder), recurrent, in partial remission (HCC)   Was on Cymbalta  in the past, but was discontinued in the last visit as he did not have symptoms of depression He has hypersomnolence since stopping Cymbalta , could be due to alcohol  and marijuana use as well Needs to avoid substance use including alcohol , tobacco and marijuana Restart Cymbalta  at 30 mg QD dose      Relevant Medications   DULoxetine  (CYMBALTA ) 30 MG capsule   Thiamine  deficiency   Needs to take thiamine  100 mg QD  Relevant Medications   thiamine  (VITAMIN B1) 100 MG tablet      Outpatient Encounter Medications as of 02/24/2024  Medication Sig   amLODipine  (NORVASC ) 10 MG tablet Take 1 tablet (10 mg total) by mouth daily.   aspirin  EC 81 MG tablet Take 81 mg by mouth daily. Swallow whole.   atorvastatin  (LIPITOR) 40 MG tablet Take 1 tablet (40 mg total) by mouth daily.   Cyanocobalamin  (B-12) 1000 MCG TABS Take by mouth.    hydrochlorothiazide  (HYDRODIURIL ) 12.5 MG tablet Take 1 tablet (12.5 mg total) by mouth daily.   losartan  (COZAAR ) 25 MG tablet Take 1 tablet (25 mg total) by mouth daily.   Riboflavin (B-2) 100 MG TABS Take by mouth.   thiamine  (VITAMIN B1) 100 MG tablet Take 1 tablet (100 mg total) by mouth daily.   [DISCONTINUED] DULoxetine  (CYMBALTA ) 30 MG capsule Take 30 mg by mouth 2 (two) times daily.   DULoxetine  (CYMBALTA ) 30 MG capsule Take 1 capsule (30 mg total) by mouth daily.   [DISCONTINUED] terbinafine  (LAMISIL ) 250 MG tablet TAKE 1 TABLET BY MOUTH DAILY FOR 21 DAYS. (Patient not taking: Reported on 02/24/2024)   No facility-administered encounter medications on file as of 02/24/2024.    Follow-up: Return in about 4 months (around 06/23/2024) for Annual physical.   Suzzane MARLA Blanch, MD

## 2024-02-24 NOTE — Assessment & Plan Note (Signed)
 Needs to take thiamine 100 mg QD

## 2024-02-24 NOTE — Assessment & Plan Note (Signed)
 Smokes about 1 pack/day  Asked about quitting: confirms that he/she currently smokes cigarettes Advise to quit smoking: Educated about QUITTING to reduce the risk of cancer, cardio and cerebrovascular disease. Assess willingness: Unwilling to quit at this time, but is working on cutting back. Assist with counseling and pharmacotherapy: Counseled for 5 minutes and literature provided. Discussed about Wellbutrin, but he needs to be ready to quit. Arrange for follow up: follow up in 3 months and continue to offer help.

## 2024-02-24 NOTE — Assessment & Plan Note (Signed)
 No residual deficits His memory concern is likely due to dementia versus other structural abnormality On aspirin and statin Checked MRI brain due to abnormal behavior and gait disturbance

## 2024-03-01 ENCOUNTER — Encounter (HOSPITAL_COMMUNITY)
Admission: RE | Admit: 2024-03-01 | Discharge: 2024-03-01 | Disposition: A | Discharge status: Home / Self Care | Payer: Medicare PPO | Source: Ambulatory Visit | Attending: Physician Assistant | Admitting: Physician Assistant

## 2024-03-01 DIAGNOSIS — R911 Solitary pulmonary nodule: Secondary | ICD-10-CM | POA: Diagnosis not present

## 2024-03-01 DIAGNOSIS — R9389 Abnormal findings on diagnostic imaging of other specified body structures: Secondary | ICD-10-CM | POA: Diagnosis not present

## 2024-03-01 MED ORDER — FLUDEOXYGLUCOSE F - 18 (FDG) INJECTION
11.2100 | Freq: Once | INTRAVENOUS | Status: AC | PRN
  Administered 2024-03-01: 11.21 via INTRAVENOUS

## 2024-03-04 ENCOUNTER — Other Ambulatory Visit: Payer: Self-pay | Admitting: Internal Medicine

## 2024-03-04 DIAGNOSIS — I10 Essential (primary) hypertension: Secondary | ICD-10-CM

## 2024-03-07 ENCOUNTER — Other Ambulatory Visit: Payer: Self-pay | Admitting: Internal Medicine

## 2024-03-07 ENCOUNTER — Telehealth: Payer: Self-pay | Admitting: Physician Assistant

## 2024-03-07 DIAGNOSIS — I10 Essential (primary) hypertension: Secondary | ICD-10-CM

## 2024-03-07 DIAGNOSIS — R911 Solitary pulmonary nodule: Secondary | ICD-10-CM

## 2024-03-07 NOTE — Telephone Encounter (Signed)
 PET scan results have been reviewed with Dr. Ellin Saba, which showed "smoothly marginated slowly enlarging LEFT upper lobe pulmonary nodule along the pleural surface, with moderate metabolic activity.  Lesion does not appear to originate from the spine.  Indeterminate lesion.  This could represent local recurrence of benign or malignant thymoma or thyroid carcinoma, in this patient with history of thymic mass resection."  Per discussion with Dr. Ellin Saba as well as Dr. Elby Showers of interventional radiology, referral will be placed for patient to have CT-guided IR biopsy of left upper lobe pulmonary nodule.  **No answer on first attempt to contact patient.  Left message.  Will try again later today.  Carnella Guadalajara, PA-C 03/07/24 12:12 PM

## 2024-03-07 NOTE — Progress Notes (Signed)
 Irish Lack, MD  Claudean Kinds Cancel biopsy request. I told providers that this would be too difficult for [place] to sample and needs surgical evaluation.  GY       Previous Messages    ----- Message ----- From: Claudean Kinds Sent: 03/07/2024   4:15 PM EDT To: Claudean Kinds; Ir Procedure Requests Subject: CT Biopsy                                      Procedure : CT Biopsy  Reason : Left upper lobe lung nodule, moderate metabolic activity per PET scan on 03/01/2024 Dx: Nodule of upper lobe of left lung [R91.1 (ICD-10-CM)]    History : NM PET Skull Base to Thigh , CT Chest w/ , MRI thoracic spine w/wo  Provider : Darolyn Rua  Provider contact : [phone]

## 2024-03-07 NOTE — Telephone Encounter (Signed)
 In addition to left upper lobe pulmonary nodule requiring biopsy, PET scan did also note INCIDENTAL FINDING of significant gallbladder distention.  Per patient's wife, he has not complained of any nausea, vomiting, or abdominal pain.  We will notify PCP of gallbladder distention and for workup to his discretion.  Carnella Guadalajara, PA-C 03/07/24 2:45 PM

## 2024-03-12 ENCOUNTER — Other Ambulatory Visit: Payer: Self-pay

## 2024-03-12 ENCOUNTER — Telehealth: Payer: Self-pay | Admitting: Physician Assistant

## 2024-03-12 DIAGNOSIS — R911 Solitary pulmonary nodule: Secondary | ICD-10-CM

## 2024-03-12 NOTE — Telephone Encounter (Signed)
 Upon further review of CT and PET images, interventional radiologist felt that patient would be better to be seen by CT surgeon.  Referral has been placed to [facility], who requested PFTs to be performed prior to consultation.  I spoke with patient's wife, Carlito Bogert, to discuss these changes to the plan.  She verbalized understanding and agreement with the above.  Carnella Guadalajara, PA-C 03/12/24 3:48 PM

## 2024-03-16 ENCOUNTER — Ambulatory Visit: Payer: Medicare PPO | Admitting: Podiatry

## 2024-03-16 ENCOUNTER — Encounter: Payer: Self-pay | Admitting: Podiatry

## 2024-03-16 DIAGNOSIS — M79675 Pain in left toe(s): Secondary | ICD-10-CM

## 2024-03-16 DIAGNOSIS — M79674 Pain in right toe(s): Secondary | ICD-10-CM

## 2024-03-16 DIAGNOSIS — B351 Tinea unguium: Secondary | ICD-10-CM | POA: Diagnosis not present

## 2024-03-16 NOTE — Progress Notes (Signed)
  Subjective:  Patient ID: Derrick Gaines, male    DOB: [birth year],  MRN: [id number]  Chief Complaint  Patient presents with   Nail Problem    RFC    68 y.o. male presents with the above complaint. History confirmed with patient. Patient presenting with pain related to dystrophic thickened elongated nails. Patient is unable to trim own nails related to nail dystrophy and/or mobility issues. Patient does not have a history of T2DM.  No reported calluses.  Had been using cream for tinea pedis, appears improved.  Objective:  Physical Exam: warm to cool, good capillary refill nail exam onychomycosis of the toenails, onycholysis, dystrophic nails DP pulses palpable, PT pulses palpable, and protective sensation intact Left Foot:  Pain with palpation of nails due to elongation and dystrophic growth.  Maceration to the webspaces improved Right Foot: Pain with palpation of nails due to elongation and dystrophic growth.  Maceration to the webspaces improved  Assessment:   1. Pain due to onychomycosis of toenails of both feet      Plan:  Patient was evaluated and treated and all questions answered.  #Onychomycosis with pain  -Nails palliatively debrided as below. -Educated on self-care  Procedure: Nail Debridement Rationale: Pain Type of Debridement: manual, sharp debridement. Instrumentation: Nail nipper, rotary burr. Number of Nails: 10  Return in about 3 months (around 06/16/2024) for Routine Foot Care.         Bronwen Betters, DPM [facility] / [facility]

## 2024-03-22 ENCOUNTER — Ambulatory Visit (HOSPITAL_COMMUNITY)
Admission: RE | Admit: 2024-03-22 | Discharge: 2024-03-22 | Disposition: A | Discharge status: Home / Self Care | Source: Ambulatory Visit | Attending: Physician Assistant | Admitting: Physician Assistant

## 2024-03-22 ENCOUNTER — Encounter: Payer: Self-pay | Admitting: Physician Assistant

## 2024-03-22 ENCOUNTER — Ambulatory Visit (INDEPENDENT_AMBULATORY_CARE_PROVIDER_SITE_OTHER): Payer: Medicare PPO | Admitting: Physician Assistant

## 2024-03-22 VITALS — BP 133/79 | HR 83 | Resp 20 | Ht 72.0 in | Wt 234.0 lb

## 2024-03-22 DIAGNOSIS — R911 Solitary pulmonary nodule: Secondary | ICD-10-CM | POA: Insufficient documentation

## 2024-03-22 DIAGNOSIS — F0390 Unspecified dementia without behavioral disturbance: Secondary | ICD-10-CM

## 2024-03-22 DIAGNOSIS — J984 Other disorders of lung: Secondary | ICD-10-CM | POA: Diagnosis not present

## 2024-03-22 DIAGNOSIS — R0609 Other forms of dyspnea: Secondary | ICD-10-CM | POA: Diagnosis present

## 2024-03-22 DIAGNOSIS — F0150 Vascular dementia without behavioral disturbance: Secondary | ICD-10-CM | POA: Diagnosis not present

## 2024-03-22 LAB — PULMONARY FUNCTION TEST
DL/VA % pred: 72 %
DL/VA: 2.95 ml/min/mmHg/L
DLCO unc % pred: 41 %
DLCO unc: 11.59 ml/min/mmHg
FEF 25-75 Post: 0.4 L/s
FEF 25-75 Pre: 0.9 L/s
FEF2575-%Change-Post: -55 %
FEF2575-%Pred-Post: 13 %
FEF2575-%Pred-Pre: 31 %
FEV1-%Change-Post: -18 %
FEV1-%Pred-Post: 37 %
FEV1-%Pred-Pre: 45 %
FEV1-Post: 1.37 L
FEV1-Pre: 1.68 L
FEV1FVC-%Change-Post: -4 %
FEV1FVC-%Pred-Pre: 88 %
FEV6-%Change-Post: -14 %
FEV6-%Pred-Post: 46 %
FEV6-%Pred-Pre: 54 %
FEV6-Post: 2.16 L
FEV6-Pre: 2.53 L
FEV6FVC-%Change-Post: 0 %
FEV6FVC-%Pred-Post: 104 %
FEV6FVC-%Pred-Pre: 104 %
FVC-%Change-Post: -14 %
FVC-%Pred-Post: 44 %
FVC-%Pred-Pre: 51 %
FVC-Post: 2.18 L
FVC-Pre: 2.55 L
Post FEV1/FVC ratio: 63 %
Post FEV6/FVC ratio: 99 %
Pre FEV1/FVC ratio: 66 %
Pre FEV6/FVC Ratio: 99 %

## 2024-03-22 MED ORDER — MEMANTINE HCL 10 MG PO TABS: ORAL_TABLET | ORAL | 11 refills | Status: AC

## 2024-03-22 MED ORDER — ALBUTEROL SULFATE (2.5 MG/3ML) 0.083% IN NEBU
2.5000 mg | INHALATION_SOLUTION | Freq: Once | RESPIRATORY_TRACT | Status: AC
  Administered 2024-03-22: 2.5 mg via RESPIRATORY_TRACT

## 2024-03-22 MED ORDER — MEMANTINE HCL 5 MG PO TABS: ORAL_TABLET | ORAL | 11 refills | Status: DC

## 2024-03-22 NOTE — Patient Instructions (Signed)
 It was a pleasure to see you today at our office.   Recommendations:  Follow up in  6 months Recommend visiting the website : " Dementia Success [facility]" to better understand some behaviors related to memory loss.   Start Memantine 10 mg: Take 1 tablet (10 mg at night) for 2 weeks, then increase to 1 tablet (10 mg) twice a day.     For guidance regarding [facility] and if placement were needed at the facility, contact Sidney Ace, Social Worker tel: [phone]   For assessment of decision of mental capacity and competency:  Call Dr. Erick Blinks, geriatric psychiatrist at [phone]  Counseling regarding caregiver distress, including caregiver depression, anxiety and issues regarding community resources, adult day care programs, adult living facilities, or memory care questions:  please contact your  Primary Doctor's Social Worker   Whom to call: Memory  decline, memory medications: Call our office [phone]   For psychiatric meds, mood meds: Please have your primary care physician manage these medications.  If you have any severe symptoms of a stroke, or other severe issues such as confusion,severe chills or fever, etc call 911 or go to the ER as you may need to be evaluated further   [email or web address]   DRIVING: Regarding driving, in patients with progressive memory problems, driving will be impaired. We advise to have someone else do the driving if trouble finding directions or if minor accidents are reported. Independent driving assessment is available to determine safety of driving.   If you are interested in the driving assessment, you can contact the following:  [employer] in [place] [phone]   [facility] [phone]  [facility] [phone] or [phone]     RECOMMENDATIONS FOR ALL PATIENTS WITH MEMORY PROBLEMS: 1. Continue to exercise (Recommend  30 minutes of walking everyday, or 3 hours every week) 2. Increase social interactions - continue going to [place] and enjoy social gatherings with friends and family 3. Eat healthy, avoid fried foods and eat more fruits and vegetables 4. Maintain adequate blood pressure, blood sugar, and blood cholesterol level. Reducing the risk of stroke and cardiovascular disease also helps promoting better memory. 5. Avoid stressful situations. Live a simple life and avoid aggravations. Organize your time and prepare for the next day in anticipation. 6. Sleep well, avoid any interruptions of sleep and avoid any distractions in the bedroom that may interfere with adequate sleep quality 7. Avoid sugar, avoid sweets as there is a strong link between excessive sugar intake, diabetes, and cognitive impairment We discussed the Mediterranean diet, which has been shown to help patients reduce the risk of progressive memory disorders and reduces cardiovascular risk. This includes eating fish, eat fruits and green leafy vegetables, nuts like almonds and hazelnuts, walnuts, and also use olive oil. Avoid fast foods and fried foods as much as possible. Avoid sweets and sugar as sugar use has been linked to worsening of memory function.  There is always a concern of gradual progression of memory problems. If this is the case, then we may need to adjust level of care according to patient needs. Support, both to the patient and caregiver, should then be put into place.    FALL PRECAUTIONS: Be cautious when walking. Scan the area for obstacles that may increase the risk of trips and falls. When getting up in the mornings, sit up at the edge of the bed for a few minutes before getting out of bed. Consider elevating the bed at  the head end to avoid drop of blood pressure when getting up. Walk always in a well-lit room (use night lights in the walls). Avoid area rugs or power cords from appliances in the middle of the walkways. Use a  walker or a cane if necessary and consider physical therapy for balance exercise. Get your eyesight checked regularly.  FINANCIAL OVERSIGHT: Supervision, especially oversight when making financial decisions or transactions is also recommended.  HOME SAFETY: Consider the safety of the kitchen when operating appliances like stoves, microwave oven, and blender. Consider having supervision and share cooking responsibilities until no longer able to participate in those. Accidents with firearms and other hazards in the house should be identified and addressed as well.   ABILITY TO BE LEFT ALONE: If patient is unable to contact 911 operator, consider using LifeLine, or when the need is there, arrange for someone to stay with patients. Smoking is a fire hazard, consider supervision or cessation. Risk of wandering should be assessed by caregiver and if detected at any point, supervision and safe proof recommendations should be instituted.  MEDICATION SUPERVISION: Inability to self-administer medication needs to be constantly addressed. Implement a mechanism to ensure safe administration of the medications.
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
## 2024-03-23 ENCOUNTER — Encounter: Admitting: Thoracic Surgery (Cardiothoracic Vascular Surgery)

## 2024-03-26 DIAGNOSIS — N189 Chronic kidney disease, unspecified: Secondary | ICD-10-CM | POA: Diagnosis not present

## 2024-03-26 DIAGNOSIS — D631 Anemia in chronic kidney disease: Secondary | ICD-10-CM | POA: Diagnosis not present

## 2024-03-26 DIAGNOSIS — R809 Proteinuria, unspecified: Secondary | ICD-10-CM | POA: Diagnosis not present

## 2024-03-28 DIAGNOSIS — N182 Chronic kidney disease, stage 2 (mild): Secondary | ICD-10-CM | POA: Diagnosis not present

## 2024-03-28 DIAGNOSIS — E559 Vitamin D deficiency, unspecified: Secondary | ICD-10-CM | POA: Diagnosis not present

## 2024-03-28 DIAGNOSIS — R809 Proteinuria, unspecified: Secondary | ICD-10-CM | POA: Diagnosis not present

## 2024-03-28 DIAGNOSIS — I129 Hypertensive chronic kidney disease with stage 1 through stage 4 chronic kidney disease, or unspecified chronic kidney disease: Secondary | ICD-10-CM | POA: Diagnosis not present

## 2024-03-29 NOTE — Progress Notes (Unsigned)
 [address]       [place] [address]             [phone]                    Derrick Gaines [facility] Medical Record #[id number] Date of Birth: 12/19/1956  Referring: Doreatha Massed, MD Primary Care: Anabel Halon, MD Primary Cardiologist: Dina Rich, MD  Chief Complaint:   No chief complaint on file.   History of Present Illness:    Derrick Gaines 68 y.o. male presents for surgical evaluation of a ***   2.  Poorly differentiated thymic carcinoma - Presentation in 2017 with weight loss of 40 pounds - PET/CT scan (10/05/2016): Anterior mediastinal mass measuring 3.6 x 1.9 cm with SUV of 6.3 - Mediastinal mass resected on 12/29/2016, with pathology showing poorly differentiated thymic carcinoma with foci of squamous differentiation, negative margin for lung tissue, negative margin for pericardial and pleura, 1 lymph node negative, pT2 pN0 M0. - Adjuvant chemoradiation therapy was recommended, patient declined -CT chest with contrast (08/04/2022): No significant residual findings in the anterior mediastinum with no evidence of thymic mass.  Continued slow growth of pleural-based nodule in the left lung apex without aggressive characteristics.  - No B symptoms, chest pain, or new cough  - Patient was no-show for CT scan scheduled in August/September 2024  Smoking Hx: ***      Past Medical History:  Diagnosis Date   Arthritis    Cancer (HCC)    Chronic kidney disease    Hypertension    Stroke (HCC)    mild left sided weakness   Vocal cord polyp     Past Surgical History:  Procedure Laterality Date   BACK SURGERY     MICROLARYNGOSCOPY N/A 08/29/2018   Procedure: DIRECT MICROLARYNGOSCOPY WITH EXCISION OF LARYNGEAL MASS;  Surgeon: Newman Pies, MD;  Location: [facility];  Service: ENT;  Laterality: N/A;   STERNOTOMY     THYMECTOMY      Family History  Problem Relation Age of Onset   Diabetes Mother    Cancer Mother     Colon cancer Sister    Pneumonia Father    Emphysema Brother      Social History   Tobacco Use  Smoking Status Every Day   Current packs/day: 0.50   Average packs/day: 0.5 packs/day for 30.0 years (15.0 ttl pk-yrs)   Types: Cigarettes  Smokeless Tobacco Never  Tobacco Comments   1 ppd as of 02/24/24    Social History   Substance and Sexual Activity  Alcohol Use No     No Known Allergies  Current Outpatient Medications  Medication Sig Dispense Refill   amLODipine (NORVASC) 10 MG tablet Take 1 tablet (10 mg total) by mouth daily. 90 tablet 1   aspirin EC 81 MG tablet Take 81 mg by mouth daily. Swallow whole.     atorvastatin (LIPITOR) 40 MG tablet Take 1 tablet (40 mg total) by mouth daily. 90 tablet 1   Cyanocobalamin (B-12) 1000 MCG TABS Take by mouth.     DULoxetine (CYMBALTA) 30 MG capsule Take 1 capsule (30 mg total) by mouth daily. 30 capsule 5   hydrochlorothiazide (HYDRODIURIL) 12.5 MG tablet TAKE 1 TABLET BY MOUTH EVERY DAY 30 tablet 5   losartan (COZAAR) 25 MG tablet TAKE 1 TABLET (25 MG TOTAL) BY MOUTH DAILY. 30 tablet 5   memantine (NAMENDA) 10 MG tablet  Take 1 tablet (10 mg at night) for 2 weeks, then increase to 1 tablet (10 mg) twice a day 60 tablet 11   Riboflavin (B-2) 100 MG TABS Take by mouth.     thiamine (VITAMIN B1) 100 MG tablet Take 1 tablet (100 mg total) by mouth daily. 90 tablet 3   No current facility-administered medications for this visit.    ROS   PHYSICAL EXAMINATION: There were no vitals taken for this visit. Physical Exam       I have independently reviewed the above radiology studies  and reviewed the findings with the patient.   Recent Lab Findings: Lab Results  Component Value Date   WBC 6.6 02/21/2024   HGB 15.0 02/21/2024   HCT 44.6 02/21/2024   PLT 337 02/21/2024   GLUCOSE 91 02/21/2024   CHOL 116 02/21/2024   TRIG 145 02/21/2024   HDL 40 02/21/2024   LDLCALC 51 02/21/2024   ALT 9 02/21/2024   AST 14 02/21/2024    NA 140 02/21/2024   K 3.7 02/21/2024   CL 103 02/21/2024   CREATININE 1.30 (H) 02/21/2024   BUN 18 02/21/2024   CO2 23 02/21/2024   TSH 1.630 02/21/2024   INR 1.1 11/21/2020   HGBA1C 5.6 02/21/2024    Diagnostic Studies & Laboratory data:     Recent Radiology Findings:   NM PET Image Initial (PI) Skull Base To Thigh Result Date: 03/05/2024 CLINICAL DATA:  Initial treatment strategy for lung nodule. History of thymoma or thymic carcinoma. LEFT paraspinal mass. EXAM: NUCLEAR MEDICINE PET SKULL BASE TO THIGH TECHNIQUE: 11.2 mCi F-18 FDG was injected intravenously. Full-ring PET imaging was performed from the skull base to thigh after the radiotracer. CT data was obtained and used for attenuation correction and anatomic localization. Fasting blood glucose: 114 mg/dl COMPARISON:  CT 96/29/5284, 02/16/2021, MRI 02/06/2024 FINDINGS: Mediastinal blood pool activity: SUV max 2.0 Liver activity: SUV max NA NECK: No hypermetabolic lymph nodes in the neck. Incidental CT findings: None. CHEST: Ovoid smoothly marginated mass in the superior segment of the RIGHT upper lobe along the pleural surface measures 2.0 X 1.5 cm not changed in size from recent prior. Enlarged from more remote scans. Lesion demonstrates mild metabolic activity SUV max equal 2.7. No additional hypermetabolic pulmonary nodules. No hypermetabolic mediastinal nodes are tissue. Post midline sternotomy. Incidental CT findings: None. ABDOMEN/PELVIS: No abnormal hypermetabolic activity within the liver, pancreas, adrenal glands, or spleen. No hypermetabolic lymph nodes in the abdomen or pelvis. Incidental CT findings: Gallbladder is markedly enlarged measuring 5.6 cm in diameter. There is high-density material within the lumen of the gallbladder which appears to layer. There are are small cholesterol gallstones towards the neck of the gallbladder. SKELETON: No focal hypermetabolic activity to suggest skeletal metastasis. Incidental CT findings:  None. IMPRESSION: 1. Smoothly marginated slowly enlarging LEFT upper lobe pulmonary nodule along the pleural surface. Moderate metabolic activity is present. Lesion does not appear to originate from the spine. Indeterminate lesion. Patient with history of thymic mass resection. This could represent local recurrence of benign or malignant thymoma or thyroid carcinoma. 2. Midline sternotomy. 3. Marked distension of the gallbladder with high-density fluid. Consider gallbladder ultrasound for further evaluation. Electronically Signed   By: Genevive Bi M.D.   On: 03/05/2024 12:34     PFTs:  - FVC: 51% - FEV1: 45% -DLCO: 41%  1. Smoothly marginated slowly enlarging LEFT upper lobe pulmonary nodule along the pleural surface. Moderate metabolic activity is present. Lesion does not appear  to originate from the spine. Indeterminate lesion. Patient with history of thymic mass resection. This could represent local recurrence of benign or malignant thymoma  Assessment / Plan:   68yo male with left apical pulmonary nodule.  He has a hx of thymoma resection in 2018.  Marginal lung function.  Possible recurrence. ION biopsy.     I  spent {CHL ONC TIME VISIT - QQVZD:[id number]} with  the patient face to face in counseling and coordination of care.    Corliss Skains 03/29/2024 3:53 PM

## 2024-03-30 ENCOUNTER — Institutional Professional Consult (permissible substitution) (INDEPENDENT_AMBULATORY_CARE_PROVIDER_SITE_OTHER): Admitting: Thoracic Surgery (Cardiothoracic Vascular Surgery)

## 2024-03-30 VITALS — BP 157/87 | HR 88 | Resp 18 | Ht 72.0 in | Wt 230.0 lb

## 2024-03-30 DIAGNOSIS — R911 Solitary pulmonary nodule: Secondary | ICD-10-CM

## 2024-04-03 ENCOUNTER — Encounter: Payer: Self-pay | Admitting: *Deleted

## 2024-04-03 ENCOUNTER — Other Ambulatory Visit: Payer: Self-pay | Admitting: Thoracic Surgery (Cardiothoracic Vascular Surgery)

## 2024-04-03 ENCOUNTER — Other Ambulatory Visit: Payer: Self-pay | Admitting: *Deleted

## 2024-04-03 ENCOUNTER — Encounter: Payer: Self-pay | Admitting: Thoracic Surgery (Cardiothoracic Vascular Surgery)

## 2024-04-03 DIAGNOSIS — R911 Solitary pulmonary nodule: Secondary | ICD-10-CM

## 2024-04-26 ENCOUNTER — Ambulatory Visit (HOSPITAL_COMMUNITY)
Admission: RE | Admit: 2024-04-26 | Discharge: 2024-04-26 | Disposition: A | Discharge status: Home / Self Care | Source: Ambulatory Visit | Attending: Thoracic Surgery (Cardiothoracic Vascular Surgery) | Admitting: Thoracic Surgery (Cardiothoracic Vascular Surgery)

## 2024-04-26 DIAGNOSIS — R918 Other nonspecific abnormal finding of lung field: Secondary | ICD-10-CM | POA: Diagnosis not present

## 2024-04-26 DIAGNOSIS — R911 Solitary pulmonary nodule: Secondary | ICD-10-CM | POA: Insufficient documentation

## 2024-04-26 DIAGNOSIS — J432 Centrilobular emphysema: Secondary | ICD-10-CM | POA: Diagnosis not present

## 2024-04-26 DIAGNOSIS — D3501 Benign neoplasm of right adrenal gland: Secondary | ICD-10-CM | POA: Diagnosis not present

## 2024-05-07 NOTE — Progress Notes (Signed)
 Surgical Instructions   Your procedure is scheduled on Thursday, May 15th. Report to [facility] Main Entrance "A" at 5:30 A.M., then check in with the Admitting office. Any questions or running late day of surgery: call [phone]  Questions prior to your surgery date: call [phone], Monday-Friday, 8am-4pm. If you experience any cold or flu symptoms such as cough, fever, chills, shortness of breath, etc. between now and your scheduled surgery, please notify us  at the above number.     Remember:  Do not eat or drink after midnight the night before your surgery    Take these medicines the morning of surgery with A SIP OF WATER  amLODipine  (NORVASC )  atorvastatin  (LIPITOR)  DULoxetine  (CYMBALTA )  memantine  (NAMENDA )   Follow your surgeon's instructions on when to stop Asprin.  If no instructions were given by your surgeon then you will need to call the office to get those instructions.     One week prior to surgery, STOP taking any Aspirin  (unless otherwise instructed by your surgeon) Aleve, Naproxen, Ibuprofen, Motrin, Advil, Goody's, BC's, all herbal medications, fish oil, and non-prescription vitamins.                     Do NOT Smoke (Tobacco/Vaping) for 24 hours prior to your procedure.  If you use a CPAP at night, you may bring your mask/headgear for your overnight stay.   You will be asked to remove any contacts, glasses, piercing's, hearing aid's, dentures/partials prior to surgery. Please bring cases for these items if needed.    Patients discharged the day of surgery will not be allowed to drive home, and someone needs to stay with them for 24 hours.  SURGICAL WAITING ROOM VISITATION Patients may have no more than 2 support people in the waiting area - these visitors may rotate.   Pre-op nurse will coordinate an appropriate time for 1 ADULT support person, who may not rotate, to accompany patient in pre-op.  Children under the age of 73 must have an adult with them  who is not the patient and must remain in the main waiting area with an adult.  If the patient needs to stay at the hospital during part of their recovery, the visitor guidelines for inpatient rooms apply.  Please refer to the [facility] website for the visitor guidelines for any additional information.   If you received a COVID test during your pre-op visit  it is requested that you wear a mask when out in public, stay away from anyone that may not be feeling well and notify your surgeon if you develop symptoms. If you have been in contact with anyone that has tested positive in the last 10 days please notify you surgeon.      Pre-operative CHG Bathing Instructions   You can play a key role in reducing the risk of infection after surgery. Your skin needs to be as free of germs as possible. You can reduce the number of germs on your skin by washing with CHG (chlorhexidine gluconate) soap before surgery. CHG is an antiseptic soap that kills germs and continues to kill germs even after washing.   DO NOT use if you have an allergy to chlorhexidine/CHG or antibacterial soaps. If your skin becomes reddened or irritated, stop using the CHG and notify one of our RNs at [phone].              TAKE A SHOWER THE NIGHT BEFORE SURGERY AND THE DAY OF SURGERY  Please keep in mind the following:  DO NOT shave, including legs and underarms, 48 hours prior to surgery.   You may shave your face before/day of surgery.  Place clean sheets on your bed the night before surgery Use a clean washcloth (not used since being washed) for each shower. DO NOT sleep with pet's night before surgery.  CHG Shower Instructions:  Wash your face and private area with normal soap. If you choose to wash your hair, wash first with your normal shampoo.  After you use shampoo/soap, rinse your hair and body thoroughly to remove shampoo/soap residue.  Turn the water OFF and apply half the bottle of CHG soap to a CLEAN  washcloth.  Apply CHG soap ONLY FROM YOUR NECK DOWN TO YOUR TOES (washing for 3-5 minutes)  DO NOT use CHG soap on face, private areas, open wounds, or sores.  Pay special attention to the area where your surgery is being performed.  If you are having back surgery, having someone wash your back for you may be helpful. Wait 2 minutes after CHG soap is applied, then you may rinse off the CHG soap.  Pat dry with a clean towel  Put on clean pajamas    Additional instructions for the day of surgery: DO NOT APPLY any lotions, deodorants, cologne, or perfumes.   Do not wear jewelry or makeup Do not wear nail polish, gel polish, artificial nails, or any other type of covering on natural nails (fingers and toes) Do not bring valuables to the hospital. [facility] is not responsible for valuables/personal belongings. Put on clean/comfortable clothes.  Please brush your teeth.  Ask your nurse before applying any prescription medications to the skin.

## 2024-05-08 ENCOUNTER — Encounter (HOSPITAL_COMMUNITY)
Admission: RE | Admit: 2024-05-08 | Discharge: 2024-05-08 | Disposition: A | Discharge status: Home / Self Care | Source: Ambulatory Visit | Attending: Thoracic Surgery (Cardiothoracic Vascular Surgery) | Admitting: Thoracic Surgery (Cardiothoracic Vascular Surgery)

## 2024-05-08 ENCOUNTER — Encounter (HOSPITAL_COMMUNITY): Payer: Self-pay

## 2024-05-08 ENCOUNTER — Other Ambulatory Visit: Payer: Self-pay

## 2024-05-08 ENCOUNTER — Other Ambulatory Visit (HOSPITAL_COMMUNITY)

## 2024-05-08 ENCOUNTER — Ambulatory Visit (HOSPITAL_COMMUNITY)
Admission: RE | Admit: 2024-05-08 | Discharge: 2024-05-08 | Disposition: A | Discharge status: Home / Self Care | Source: Ambulatory Visit | Attending: Thoracic Surgery (Cardiothoracic Vascular Surgery)

## 2024-05-08 VITALS — BP 127/87 | HR 79 | Temp 97.7°F | Resp 18 | Ht 72.0 in | Wt 233.6 lb

## 2024-05-08 DIAGNOSIS — Z8673 Personal history of transient ischemic attack (TIA), and cerebral infarction without residual deficits: Secondary | ICD-10-CM | POA: Insufficient documentation

## 2024-05-08 DIAGNOSIS — I4519 Other right bundle-branch block: Secondary | ICD-10-CM | POA: Insufficient documentation

## 2024-05-08 DIAGNOSIS — F17210 Nicotine dependence, cigarettes, uncomplicated: Secondary | ICD-10-CM | POA: Diagnosis not present

## 2024-05-08 DIAGNOSIS — I129 Hypertensive chronic kidney disease with stage 1 through stage 4 chronic kidney disease, or unspecified chronic kidney disease: Secondary | ICD-10-CM | POA: Diagnosis not present

## 2024-05-08 DIAGNOSIS — N183 Chronic kidney disease, stage 3 unspecified: Secondary | ICD-10-CM | POA: Insufficient documentation

## 2024-05-08 DIAGNOSIS — R911 Solitary pulmonary nodule: Secondary | ICD-10-CM | POA: Diagnosis not present

## 2024-05-08 DIAGNOSIS — C37 Malignant neoplasm of thymus: Secondary | ICD-10-CM | POA: Diagnosis not present

## 2024-05-08 DIAGNOSIS — Z01818 Encounter for other preprocedural examination: Secondary | ICD-10-CM | POA: Insufficient documentation

## 2024-05-08 HISTORY — DX: Unspecified dementia, unspecified severity, without behavioral disturbance, psychotic disturbance, mood disturbance, and anxiety: F03.90

## 2024-05-08 LAB — CBC
HCT: 47.3 % (ref 39.0–52.0)
Hemoglobin: 15.2 g/dL (ref 13.0–17.0)
MCH: 32.4 pg (ref 26.0–34.0)
MCHC: 32.1 g/dL (ref 30.0–36.0)
MCV: 100.9 fL — ABNORMAL HIGH (ref 80.0–100.0)
Platelets: 320 10*3/uL (ref 150–400)
RBC: 4.69 MIL/uL (ref 4.22–5.81)
RDW: 12.6 % (ref 11.5–15.5)
WBC: 6.1 10*3/uL (ref 4.0–10.5)
nRBC: 0 % (ref 0.0–0.2)

## 2024-05-08 LAB — COMPREHENSIVE METABOLIC PANEL WITH GFR
ALT: 17 U/L (ref 0–44)
AST: 21 U/L (ref 15–41)
Albumin: 3.8 g/dL (ref 3.5–5.0)
Alkaline Phosphatase: 57 U/L (ref 38–126)
Anion gap: 7 (ref 5–15)
BUN: 17 mg/dL (ref 8–23)
CO2: 28 mmol/L (ref 22–32)
Calcium: 9.5 mg/dL (ref 8.9–10.3)
Chloride: 103 mmol/L (ref 98–111)
Creatinine, Ser: 1.41 mg/dL — ABNORMAL HIGH (ref 0.61–1.24)
GFR, Estimated: 55 mL/min — ABNORMAL LOW (ref >60)
Glucose, Bld: 118 mg/dL — ABNORMAL HIGH (ref 70–99)
Potassium: 3.7 mmol/L (ref 3.5–5.1)
Sodium: 138 mmol/L (ref 135–145)
Total Bilirubin: 0.5 mg/dL (ref 0.0–1.2)
Total Protein: 7.8 g/dL (ref 6.5–8.1)

## 2024-05-08 LAB — PROTIME-INR
INR: 1.1 (ref 0.8–1.2)
Prothrombin Time: 14.2 s (ref 11.4–15.2)

## 2024-05-08 LAB — APTT: aPTT: 35 s (ref 24–36)

## 2024-05-08 NOTE — Progress Notes (Signed)
 Surgical Instructions   Your procedure is scheduled on Thursday, May 15th. Report to [facility] Main Entrance "A" at 5:30 A.M., then check in with the Admitting office. Any questions or running late day of surgery: call [phone]  Questions prior to your surgery date: call [phone], Monday-Friday, 8am-4pm. If you experience any cold or flu symptoms such as cough, fever, chills, shortness of breath, etc. between now and your scheduled surgery, please notify us  at the above number.     Remember:  Do not eat or drink after midnight the night before your surgery    Take these medicines the morning of surgery with A SIP OF WATER  amLODipine  (NORVASC )  atorvastatin  (LIPITOR)  DULoxetine  (CYMBALTA )  memantine  (NAMENDA )    Continue taking your Aspirin  through the day before surgery. DO NOT take any the morning of surgery.   One week prior to surgery, STOP taking any Aleve, Naproxen, Ibuprofen, Motrin, Advil, Goody's, BC's, all herbal medications, fish oil, and non-prescription vitamins.                     Do NOT Smoke (Tobacco/Vaping) for 24 hours prior to your procedure.  If you use a CPAP at night, you may bring your mask/headgear for your overnight stay.   You will be asked to remove any contacts, glasses, piercing's, hearing aid's, dentures/partials prior to surgery. Please bring cases for these items if needed.    Patients discharged the day of surgery will not be allowed to drive home, and someone needs to stay with them for 24 hours.  SURGICAL WAITING ROOM VISITATION Patients may have no more than 2 support people in the waiting area - these visitors may rotate.   Pre-op nurse will coordinate an appropriate time for 1 ADULT support person, who may not rotate, to accompany patient in pre-op.  Children under the age of 68 must have an adult with them who is not the patient and must remain in the main waiting area with an adult.  If the patient needs to stay at the  hospital during part of their recovery, the visitor guidelines for inpatient rooms apply.  Please refer to the [facility] website for the visitor guidelines for any additional information.   If you received a COVID test during your pre-op visit  it is requested that you wear a mask when out in public, stay away from anyone that may not be feeling well and notify your surgeon if you develop symptoms. If you have been in contact with anyone that has tested positive in the last 10 days please notify you surgeon.      Pre-operative CHG Bathing Instructions   You can play a key role in reducing the risk of infection after surgery. Your skin needs to be as free of germs as possible. You can reduce the number of germs on your skin by washing with CHG (chlorhexidine gluconate) soap before surgery. CHG is an antiseptic soap that kills germs and continues to kill germs even after washing.   DO NOT use if you have an allergy to chlorhexidine/CHG or antibacterial soaps. If your skin becomes reddened or irritated, stop using the CHG and notify one of our RNs at [phone].              TAKE A SHOWER THE NIGHT BEFORE SURGERY AND THE DAY OF SURGERY    Please keep in mind the following:  DO NOT shave, including legs and underarms, 48 hours prior to surgery.  You may shave your face before/day of surgery.  Place clean sheets on your bed the night before surgery Use a clean washcloth (not used since being washed) for each shower. DO NOT sleep with pet's night before surgery.  CHG Shower Instructions:  Wash your face and private area with normal soap. If you choose to wash your hair, wash first with your normal shampoo.  After you use shampoo/soap, rinse your hair and body thoroughly to remove shampoo/soap residue.  Turn the water OFF and apply half the bottle of CHG soap to a CLEAN washcloth.  Apply CHG soap ONLY FROM YOUR NECK DOWN TO YOUR TOES (washing for 3-5 minutes)  DO NOT use CHG soap on face,  private areas, open wounds, or sores.  Pay special attention to the area where your surgery is being performed.  If you are having back surgery, having someone wash your back for you may be helpful. Wait 2 minutes after CHG soap is applied, then you may rinse off the CHG soap.  Pat dry with a clean towel  Put on clean pajamas    Additional instructions for the day of surgery: DO NOT APPLY any lotions, deodorants, cologne, or perfumes.   Do not wear jewelry or makeup Do not wear nail polish, gel polish, artificial nails, or any other type of covering on natural nails (fingers and toes) Do not bring valuables to the hospital. [facility] is not responsible for valuables/personal belongings. Put on clean/comfortable clothes.  Please brush your teeth.  Ask your nurse before applying any prescription medications to the skin.

## 2024-05-08 NOTE — Progress Notes (Signed)
 PCP - Rutwik Patel,MD Cardiologist - Astrid Lay  PPM/ICD - denies Device Orders -  Rep Notified -   Chest x-ray - 04/26/24-CT EKG - 04/1324 Stress Test - denies ECHO - 04/28/23 Cardiac Cath - denies  Sleep Study - denies CPAP -   Fasting Blood Sugar - na Checks Blood Sugar _____ times a day  Last dose of GLP1 agonist-  na GLP1 instructions: na  Blood Thinner Instructions:na  Aspirin  Instructions:hold DOS   ERAS Protcol -no PRE-SURGERY Ensure or G2-   COVID TEST- na   Anesthesia review: yes - cardiac history -NSTEMI,CVA  Patient denies shortness of breath, fever, cough and chest pain at PAT appointment   All instructions explained to the patient, with a verbal understanding of the material. Patient agrees to go over the instructions while at home for a better understanding.  The opportunity to ask questions was provided.

## 2024-05-09 NOTE — Progress Notes (Signed)
 Anesthesia Chart Review:  68 year old male follows with cardiology for history of HTN, HLD, remote CVA 2013, history of elevated troponin in the setting of significant HTN during 04/2023 admission.  Last seen by Dr. Armida Lander on 01/11/2024.  Per note, "- was to have outpatient stress test but never completed - at no point has he had any cardiopulmonary symptoms. EKG and echo have been benign - 7 months now since he was admitted, in absence of symptoms and with developing dementia/memory impairment will not reorder stress test at this time."  Patient was recently referred to Dr. Deloise Ferries for surgical evaluation of a left upper lobe pulmonary nodule. 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  He was offered adjuvant chemo or radiation, but the patient declined this.  On surveillance scans he was noted to have a slowly growing left apical nodule.  Recent PET/CT showed mild avidity.   PFTs 03/22/2024 indicate severe COPD, however patient had difficulty completing testing.  FVC 51%, FEV1 45%, DLCO 41%.  Full comments below.  Other pertinent history includes current smoker 1 pack/day, CKD 3.  Preop labs reviewed, creatinine mildly elevated at 1.41, otherwise unremarkable.  Spoke with Dr. Amanda Jungling via secure chat. He reviewed chart and advised pt ok for procedure from cardiac standpoint.  EKG 05/08/2024: Normal sinus rhythm. Possible Left atrial enlargement. Incomplete right bundle branch block. ST & T wave abnormality, consider inferior ischemia. ST & T wave abnormality, consider anterolateral ischemia  PFTs 03/22/2024: Posttest comments: The results of this test does not meet ATS standards for acceptability and repeatability.  Patient gave his best, however not sure how well he could following.  Tried several coaching methods throughout testing as well 3 RT assisted.  HHN given with 2.5 mg of albuterol .  Hemoglobin  default value of 14.6 used.  Unable to complete pleth portion of the study due to claustrophobia and following instructions.  The results of this test does not meet ATS standards for acceptability and repeatability.  DLCO effort not within standard.  The FVC, FEV1, FEV1/FVC ratio and FEF 25 to 75% are reduced indicating airway obstruction.  It is impossible to adequately evaluate the flow-volume loop due to excessive variability such as might be produced by coughing.  The slow vital capacity is reduced.  Following administration of bronchodilators there is no significant response.  The reduced diffusing capacity indicates severe loss of functional alveolar capillary surface however the diffusing capacity was not corrected for the patient's hemoglobin.  Conclusions: In view of the severity of the diffusion defect, studies with exercise would be helpful to evaluate the presence of hypoxemia.  Pulmonary function diagnosis: Spirometry flow-severely reduced, without significant response to bronchodilator.  Volumes-expiratory volume severely reduced.  TLC not measured directly.  Diffusion severely reduced.  TTE 04/28/23: 1. Left ventricular ejection fraction, by estimation, is 55 to 60%. The  left ventricle has normal function. The left ventricle has no regional  wall motion abnormalities. Left ventricular diastolic parameters are  consistent with Grade I diastolic  dysfunction (impaired relaxation).   2. Right ventricular systolic function is normal. The right ventricular  size is normal. Tricuspid regurgitation signal is inadequate for assessing  PA pressure.   3. The mitral valve was not well visualized. No evidence of mitral valve  regurgitation. No evidence of mitral stenosis.   4. The aortic valve was not well visualized. Aortic valve regurgitation  is not visualized. No aortic stenosis is  present.   5. Aortic dilatation noted. There is mild dilatation of the ascending  aorta, measuring 36  mm.   6. The inferior vena cava is normal in size with <50% respiratory  variability, suggesting right atrial pressure of 8 mmHg.      Edilia Gordon [facility] Short Stay Center/Anesthesiology Phone [phone] 05/09/2024 11:43 AM

## 2024-05-09 NOTE — Anesthesia Preprocedure Evaluation (Addendum)
 Anesthesia Evaluation  Patient identified by MRN, date of birth, ID band Patient awake    Reviewed: Allergy & Precautions, NPO status , Patient's Chart, lab work & pertinent test results  Airway Mallampati: III  TM Distance: >3 FB Neck ROM: Full    Dental  (+) Teeth Intact, Dental Advisory Given   Pulmonary Current Smoker and Patient abstained from smoking. Pulmonary nodule   Pulmonary exam normal breath sounds clear to auscultation       Cardiovascular hypertension, Pt. on medications Normal cardiovascular exam Rhythm:Regular Rate:Normal     Neuro/Psych  PSYCHIATRIC DISORDERS  Depression   Dementia CVA, Residual Symptoms    GI/Hepatic Neg liver ROS,GERD  ,,  Endo/Other  Obesity   Renal/GU Renal disease     Musculoskeletal  (+) Arthritis ,    Abdominal  (+) + obese  Peds  Hematology negative hematology ROS (+)   Anesthesia Other Findings pulm nodule  Reproductive/Obstetrics                             Anesthesia Physical Anesthesia Plan  ASA: 3  Anesthesia Plan: General   Post-op Pain Management: Tylenol  PO (pre-op)*   Induction: Intravenous  PONV Risk Score and Plan: 1 and TIVA, Ondansetron  and Dexamethasone   Airway Management Planned: Oral ETT  Additional Equipment:   Intra-op Plan:   Post-operative Plan: Extubation in OR  Informed Consent: I have reviewed the patients History and Physical, chart, labs and discussed the procedure including the risks, benefits and alternatives for the proposed anesthesia with the patient or authorized representative who has indicated his/her understanding and acceptance.     Dental advisory given  Plan Discussed with: CRNA  Anesthesia Plan Comments: (PAT note by Rudy Costain, PA-C: 68 year old male follows with cardiology for history of HTN, HLD, remote CVA 2013, history of elevated troponin in the setting of significant HTN during  04/2023 admission.  Last seen by Dr. Armida Lander on 01/11/2024.  Per note, "- was to have outpatient stress test but never completed - at no point has he had any cardiopulmonary symptoms. EKG and echo have been benign - 7 months now since he was admitted, in absence of symptoms and with developing dementia/memory impairment will not reorder stress test at this time."  Patient was recently referred to Dr. Deloise Ferries for surgical evaluation of a left upper lobe pulmonary nodule. 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  He was offered adjuvant chemo or radiation, but the patient declined this.  On surveillance scans he was noted to have a slowly growing left apical nodule.  Recent PET/CT showed mild avidity.   PFTs 03/22/2024 indicate severe COPD, however patient had difficulty completing testing.  FVC 51%, FEV1 45%, DLCO 41%.  Full comments below.  Other pertinent history includes current smoker 1 pack/day, CKD 3.  Preop labs reviewed, creatinine mildly elevated at 1.41, otherwise unremarkable.  Spoke with Dr. Amanda Jungling via secure chat. He reviewed chart and advised pt ok for procedure from cardiac standpoint.  EKG 05/08/2024: Normal sinus rhythm. Possible Left atrial enlargement. Incomplete right bundle branch block. ST & T wave abnormality, consider inferior ischemia. ST & T wave abnormality, consider anterolateral ischemia  PFTs 03/22/2024: Posttest comments: The results of this test does not meet ATS standards for acceptability and repeatability.  Patient gave his best, however not sure how well he could following.  Tried several  coaching methods throughout testing as well 3 RT assisted.  HHN given with 2.5 mg of albuterol .  Hemoglobin default value of 14.6 used.  Unable to complete pleth portion of the study due to claustrophobia and following instructions.  The results of this test does not meet ATS standards for  acceptability and repeatability.  DLCO effort not within standard.  The FVC, FEV1, FEV1/FVC ratio and FEF 25 to 75% are reduced indicating airway obstruction.  It is impossible to adequately evaluate the flow-volume loop due to excessive variability such as might be produced by coughing.  The slow vital capacity is reduced.  Following administration of bronchodilators there is no significant response.  The reduced diffusing capacity indicates severe loss of functional alveolar capillary surface however the diffusing capacity was not corrected for the patient's hemoglobin.  Conclusions: In view of the severity of the diffusion defect, studies with exercise would be helpful to evaluate the presence of hypoxemia.  Pulmonary function diagnosis: Spirometry flow-severely reduced, without significant response to bronchodilator.  Volumes-expiratory volume severely reduced.  TLC not measured directly.  Diffusion severely reduced.  TTE 04/28/23: 1. Left ventricular ejection fraction, by estimation, is 55 to 60%. The  left ventricle has normal function. The left ventricle has no regional  wall motion abnormalities. Left ventricular diastolic parameters are  consistent with Grade I diastolic  dysfunction (impaired relaxation).  2. Right ventricular systolic function is normal. The right ventricular  size is normal. Tricuspid regurgitation signal is inadequate for assessing  PA pressure.  3. The mitral valve was not well visualized. No evidence of mitral valve  regurgitation. No evidence of mitral stenosis.  4. The aortic valve was not well visualized. Aortic valve regurgitation  is not visualized. No aortic stenosis is present.  5. Aortic dilatation noted. There is mild dilatation of the ascending  aorta, measuring 36 mm.  6. The inferior vena cava is normal in size with <50% respiratory  variability, suggesting right atrial pressure of 8 mmHg.  )        Anesthesia Quick Evaluation

## 2024-05-10 ENCOUNTER — Ambulatory Visit (HOSPITAL_COMMUNITY): Payer: Self-pay | Admitting: Physician Assistant

## 2024-05-10 ENCOUNTER — Other Ambulatory Visit: Payer: Self-pay

## 2024-05-10 ENCOUNTER — Ambulatory Visit (HOSPITAL_COMMUNITY)

## 2024-05-10 ENCOUNTER — Ambulatory Visit (HOSPITAL_COMMUNITY)
Admission: RE | Admit: 2024-05-10 | Discharge: 2024-05-10 | Disposition: A | Discharge status: Home / Self Care | Attending: Thoracic Surgery (Cardiothoracic Vascular Surgery) | Admitting: Thoracic Surgery (Cardiothoracic Vascular Surgery)

## 2024-05-10 ENCOUNTER — Encounter (HOSPITAL_COMMUNITY)
Admission: RE | Disposition: A | Discharge status: Home / Self Care | Payer: Self-pay | Source: Home / Self Care | Attending: Thoracic Surgery (Cardiothoracic Vascular Surgery)

## 2024-05-10 ENCOUNTER — Encounter (HOSPITAL_COMMUNITY): Payer: Self-pay | Admitting: Thoracic Surgery (Cardiothoracic Vascular Surgery)

## 2024-05-10 ENCOUNTER — Ambulatory Visit (HOSPITAL_BASED_OUTPATIENT_CLINIC_OR_DEPARTMENT_OTHER): Payer: Self-pay | Admitting: Anesthesiology

## 2024-05-10 DIAGNOSIS — R911 Solitary pulmonary nodule: Secondary | ICD-10-CM | POA: Insufficient documentation

## 2024-05-10 DIAGNOSIS — N183 Chronic kidney disease, stage 3 unspecified: Secondary | ICD-10-CM | POA: Diagnosis not present

## 2024-05-10 DIAGNOSIS — F17200 Nicotine dependence, unspecified, uncomplicated: Secondary | ICD-10-CM | POA: Diagnosis not present

## 2024-05-10 DIAGNOSIS — I129 Hypertensive chronic kidney disease with stage 1 through stage 4 chronic kidney disease, or unspecified chronic kidney disease: Secondary | ICD-10-CM | POA: Diagnosis not present

## 2024-05-10 DIAGNOSIS — E669 Obesity, unspecified: Secondary | ICD-10-CM | POA: Insufficient documentation

## 2024-05-10 DIAGNOSIS — N189 Chronic kidney disease, unspecified: Secondary | ICD-10-CM | POA: Diagnosis not present

## 2024-05-10 DIAGNOSIS — K219 Gastro-esophageal reflux disease without esophagitis: Secondary | ICD-10-CM | POA: Diagnosis not present

## 2024-05-10 DIAGNOSIS — F17210 Nicotine dependence, cigarettes, uncomplicated: Secondary | ICD-10-CM

## 2024-05-10 DIAGNOSIS — Z48813 Encounter for surgical aftercare following surgery on the respiratory system: Secondary | ICD-10-CM | POA: Diagnosis not present

## 2024-05-10 DIAGNOSIS — R846 Abnormal cytological findings in specimens from respiratory organs and thorax: Secondary | ICD-10-CM | POA: Diagnosis not present

## 2024-05-10 DIAGNOSIS — J9811 Atelectasis: Secondary | ICD-10-CM | POA: Diagnosis not present

## 2024-05-10 HISTORY — PX: BRONCHIAL BRUSHINGS: SHX5108

## 2024-05-10 HISTORY — PX: BRONCHIAL NEEDLE ASPIRATION BIOPSY: SHX5106

## 2024-05-10 HISTORY — PX: VIDEO BRONCHOSCOPY WITH ENDOBRONCHIAL NAVIGATION: SHX6175

## 2024-05-10 SURGERY — VIDEO BRONCHOSCOPY WITH ENDOBRONCHIAL NAVIGATION
Anesthesia: General

## 2024-05-10 MED ORDER — ROCURONIUM BROMIDE 10 MG/ML (PF) SYRINGE
PREFILLED_SYRINGE | INTRAVENOUS | Status: DC | PRN
  Administered 2024-05-10: 20 mg via INTRAVENOUS
  Administered 2024-05-10: 60 mg via INTRAVENOUS
  Administered 2024-05-10: 20 mg via INTRAVENOUS

## 2024-05-10 MED ORDER — FENTANYL CITRATE (PF) 100 MCG/2ML IJ SOLN: 25.0000 ug | INTRAMUSCULAR | Status: DC | PRN

## 2024-05-10 MED ORDER — LACTATED RINGERS IV SOLN: INTRAVENOUS | Status: DC

## 2024-05-10 MED ORDER — AMISULPRIDE (ANTIEMETIC) 5 MG/2ML IV SOLN: 10.0000 mg | Freq: Once | INTRAVENOUS | Status: DC | PRN

## 2024-05-10 MED ORDER — FENTANYL CITRATE (PF) 100 MCG/2ML IJ SOLN
INTRAMUSCULAR | Status: AC
  Filled 2024-05-10: qty 2

## 2024-05-10 MED ORDER — PROPOFOL 500 MG/50ML IV EMUL
INTRAVENOUS | Status: DC | PRN
  Administered 2024-05-10: 125 ug/kg/min via INTRAVENOUS
  Administered 2024-05-10: 90 ug/kg/min via INTRAVENOUS

## 2024-05-10 MED ORDER — FENTANYL CITRATE (PF) 250 MCG/5ML IJ SOLN
INTRAMUSCULAR | Status: DC | PRN
  Administered 2024-05-10: 50 ug via INTRAVENOUS

## 2024-05-10 MED ORDER — DEXAMETHASONE SODIUM PHOSPHATE 10 MG/ML IJ SOLN
INTRAMUSCULAR | Status: DC | PRN
  Administered 2024-05-10: 10 mg via INTRAVENOUS

## 2024-05-10 MED ORDER — MIDAZOLAM HCL 2 MG/2ML IJ SOLN
INTRAMUSCULAR | Status: AC
  Filled 2024-05-10: qty 2

## 2024-05-10 MED ORDER — PHENYLEPHRINE HCL-NACL 20-0.9 MG/250ML-% IV SOLN
INTRAVENOUS | Status: DC | PRN
  Administered 2024-05-10: 20 ug/min via INTRAVENOUS

## 2024-05-10 MED ORDER — PHENYLEPHRINE 80 MCG/ML (10ML) SYRINGE FOR IV PUSH (FOR BLOOD PRESSURE SUPPORT)
PREFILLED_SYRINGE | INTRAVENOUS | Status: DC | PRN
  Administered 2024-05-10: 160 ug via INTRAVENOUS

## 2024-05-10 MED ORDER — ONDANSETRON HCL 4 MG/2ML IJ SOLN: 4.0000 mg | Freq: Once | INTRAMUSCULAR | Status: DC | PRN

## 2024-05-10 MED ORDER — PROPOFOL 10 MG/ML IV BOLUS
INTRAVENOUS | Status: DC | PRN
  Administered 2024-05-10: 160 mg via INTRAVENOUS

## 2024-05-10 MED ORDER — LIDOCAINE 2% (20 MG/ML) 5 ML SYRINGE
INTRAMUSCULAR | Status: DC | PRN
  Administered 2024-05-10: 100 mg via INTRAVENOUS

## 2024-05-10 MED ORDER — MIDAZOLAM HCL 2 MG/2ML IJ SOLN
INTRAMUSCULAR | Status: DC | PRN
  Administered 2024-05-10: 2 mg via INTRAVENOUS

## 2024-05-10 MED ORDER — ACETAMINOPHEN 500 MG PO TABS
1000.0000 mg | ORAL_TABLET | Freq: Once | ORAL | Status: AC
  Administered 2024-05-10: 1000 mg via ORAL
  Filled 2024-05-10: qty 2

## 2024-05-10 MED ORDER — SUGAMMADEX SODIUM 200 MG/2ML IV SOLN
INTRAVENOUS | Status: DC | PRN
  Administered 2024-05-10: 200 mg via INTRAVENOUS

## 2024-05-10 MED ORDER — CHLORHEXIDINE GLUCONATE 0.12 % MT SOLN
15.0000 mL | Freq: Once | OROMUCOSAL | Status: AC
  Administered 2024-05-10: 15 mL via OROMUCOSAL
  Filled 2024-05-10: qty 15

## 2024-05-10 MED ORDER — ONDANSETRON HCL 4 MG/2ML IJ SOLN
INTRAMUSCULAR | Status: DC | PRN
  Administered 2024-05-10: 4 mg via INTRAVENOUS

## 2024-05-10 NOTE — Discharge Summary (Signed)
 Physician Discharge Summary   Patient ID: Derrick Gaines [id number] 68 y.o. 06-18-1956  Admit date: 05/10/2024  Discharge date and time: No discharge date for patient encounter.   Admitting Physician: Hilarie Lovely, MD   Discharge Physician: Deloise Ferries  Admission Diagnoses: Lung nodule [R91.1]  Discharge Diagnoses: pulmonary nodule  Admission Condition: good  Discharged Condition: good    Hospital Course: uncomplicated following procedure  Consults: None    Discharge Exam: Alert NAD Sinus EOWB ND  Disposition: Discharge disposition: 01-Home or Self Care       Patient Instructions:  Allergies as of 05/10/2024   No Known Allergies      Medication List     TAKE these medications    amLODipine  10 MG tablet Commonly known as: NORVASC  Take 1 tablet (10 mg total) by mouth daily.   aspirin  EC 81 MG tablet Take 81 mg by mouth daily. Swallow whole.   atorvastatin  40 MG tablet Commonly known as: LIPITOR Take 1 tablet (40 mg total) by mouth daily.   B-12 1000 MCG Tabs Take by mouth.   B-2 100 MG Tabs Take by mouth.   DULoxetine  30 MG capsule Commonly known as: CYMBALTA  Take 1 capsule (30 mg total) by mouth daily.   hydrochlorothiazide  12.5 MG tablet Commonly known as: HYDRODIURIL  TAKE 1 TABLET BY MOUTH EVERY DAY   losartan  25 MG tablet Commonly known as: COZAAR  TAKE 1 TABLET (25 MG TOTAL) BY MOUTH DAILY.   memantine  10 MG tablet Commonly known as: NAMENDA  Take 1 tablet (10 mg at night) for 2 weeks, then increase to 1 tablet (10 mg) twice a day   thiamine  100 MG tablet Commonly known as: VITAMIN B1 Take 1 tablet (100 mg total) by mouth daily.       Activity: activity as tolerated Diet: regular diet Wound Care: none needed  Follow-up with Dr. Deloise Ferries in 1 week.  SignedHilarie Lovely 05/10/2024 9:20 AM

## 2024-05-10 NOTE — H&P (Signed)
 [address]       [place] [address]             [phone]                                                   Derrick Gaines [facility] Medical Record #[id number] Date of Birth: 1956-11-07   Referring: Paulett Boros, MD Primary Care: Meldon Sport, MD Primary Cardiologist: Armida Lander, MD   Chief Complaint:        Chief Complaint  Patient presents with   Lung Lesion      Surgical consult, Chest CT 01/19/24/ PET Scan 03/01/24/ PFT's 03/22/24      History of Present Illness:    Derrick Gaines 68 y.o. male presents for surgical evaluation of a left upper lobe pulmonary nodule.  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  He was offered adjuvant chemo or radiation, but the patient declined this.  On surveillance scans he was noted to have a slowly growing left apical nodule.  Recent PET/CT showed mild avidity.   He continues to smoke.  He denies any respiratory symptoms.  He denies any neurologic symptoms.             Past Medical History:  Diagnosis Date   Arthritis     Cancer (HCC)     Chronic kidney disease     Hypertension     Stroke (HCC)      mild left sided weakness   Vocal cord polyp                 Past Surgical History:  Procedure Laterality Date   BACK SURGERY       MICROLARYNGOSCOPY N/A 08/29/2018    Procedure: DIRECT MICROLARYNGOSCOPY WITH EXCISION OF LARYNGEAL MASS;  Surgeon: Reynold Caves, MD;  Location: [facility];  Service: ENT;  Laterality: N/A;   STERNOTOMY       THYMECTOMY                   Family History  Problem Relation Age of Onset   Diabetes Mother     Cancer Mother     Colon cancer Sister     Pneumonia Father     Emphysema Brother              Tobacco Use History  Social History        Tobacco Use  Smoking Status Every Day   Current packs/day: 0.50   Average packs/day: 0.5 packs/day for 30.0  years (15.0 ttl pk-yrs)   Types: Cigarettes  Smokeless Tobacco Never  Tobacco Comments    1 ppd as of 02/24/24      Social History       Substance and Sexual Activity  Alcohol  Use No        Allergies  No Known Allergies           Current Outpatient Medications  Medication Sig Dispense Refill   amLODipine  (NORVASC ) 10 MG tablet Take 1 tablet (10 mg total) by mouth daily. 90 tablet 1   aspirin  EC 81 MG tablet Take 81 mg by mouth daily. Swallow whole.       atorvastatin  (LIPITOR) 40 MG  tablet Take 1 tablet (40 mg total) by mouth daily. 90 tablet 1   Cyanocobalamin  (B-12) 1000 MCG TABS Take by mouth.       DULoxetine  (CYMBALTA ) 30 MG capsule Take 1 capsule (30 mg total) by mouth daily. 30 capsule 5   hydrochlorothiazide  (HYDRODIURIL ) 12.5 MG tablet TAKE 1 TABLET BY MOUTH EVERY DAY 30 tablet 5   losartan  (COZAAR ) 25 MG tablet TAKE 1 TABLET (25 MG TOTAL) BY MOUTH DAILY. 30 tablet 5   memantine  (NAMENDA ) 10 MG tablet Take 1 tablet (10 mg at night) for 2 weeks, then increase to 1 tablet (10 mg) twice a day 60 tablet 11   Riboflavin (B-2) 100 MG TABS Take by mouth.       thiamine  (VITAMIN B1) 100 MG tablet Take 1 tablet (100 mg total) by mouth daily. 90 tablet 3      No current facility-administered medications for this visit.        Review of Systems  Constitutional:  Negative for fever, malaise/fatigue and weight loss.  Respiratory:  Negative for cough and shortness of breath.   Cardiovascular:  Negative for chest pain.  Neurological: Negative.         PHYSICAL EXAMINATION: BP (!) 157/87 (BP Location: Right Arm)   Pulse 88   Resp 18   Ht 6' (1.829 m)   Wt 230 lb (104.3 kg)   SpO2 96%   BMI 31.19 kg/m  Physical Exam Constitutional:      Appearance: Normal appearance. He is normal weight.  HENT:     Head: Normocephalic and atraumatic.  Eyes:     Extraocular Movements: Extraocular movements intact.  Cardiovascular:     Rate and Rhythm: Normal rate.  Pulmonary:      Effort: Pulmonary effort is normal. No respiratory distress.  Abdominal:     General: There is no distension.  Musculoskeletal:        General: Normal range of motion.     Cervical back: Normal range of motion.  Skin:    General: Skin is warm and dry.  Neurological:     General: No focal deficit present.     Mental Status: He is alert and oriented to person, place, and time.                I have independently reviewed the above radiology studies  and reviewed the findings with the patient.    Recent Lab Findings: Recent Labs       Lab Results  Component Value Date    WBC 6.6 02/21/2024    HGB 15.0 02/21/2024    HCT 44.6 02/21/2024    PLT 337 02/21/2024    GLUCOSE 91 02/21/2024    CHOL 116 02/21/2024    TRIG 145 02/21/2024    HDL 40 02/21/2024    LDLCALC 51 02/21/2024    ALT 9 02/21/2024    AST 14 02/21/2024    NA 140 02/21/2024    K 3.7 02/21/2024    CL 103 02/21/2024    CREATININE 1.30 (H) 02/21/2024    BUN 18 02/21/2024    CO2 23 02/21/2024    TSH 1.630 02/21/2024    INR 1.1 11/21/2020    HGBA1C 5.6 02/21/2024        Diagnostic Studies & Laboratory data:     Recent Radiology Findings:    Imaging Results  NM PET Image Initial (PI) Skull Base To Thigh Result Date: 03/05/2024 CLINICAL DATA:  Initial treatment strategy for lung nodule.  History of thymoma or thymic carcinoma. LEFT paraspinal mass. EXAM: NUCLEAR MEDICINE PET SKULL BASE TO THIGH TECHNIQUE: 11.2 mCi F-18 FDG was injected intravenously. Full-ring PET imaging was performed from the skull base to thigh after the radiotracer. CT data was obtained and used for attenuation correction and anatomic localization. Fasting blood glucose: 114 mg/dl COMPARISON:  CT 52/84/1324, 02/16/2021, MRI 02/06/2024 FINDINGS: Mediastinal blood pool activity: SUV max 2.0 Liver activity: SUV max NA NECK: No hypermetabolic lymph nodes in the neck. Incidental CT findings: None. CHEST: Ovoid smoothly marginated mass in the  superior segment of the RIGHT upper lobe along the pleural surface measures 2.0 X 1.5 cm not changed in size from recent prior. Enlarged from more remote scans. Lesion demonstrates mild metabolic activity SUV max equal 2.7. No additional hypermetabolic pulmonary nodules. No hypermetabolic mediastinal nodes are tissue. Post midline sternotomy. Incidental CT findings: None. ABDOMEN/PELVIS: No abnormal hypermetabolic activity within the liver, pancreas, adrenal glands, or spleen. No hypermetabolic lymph nodes in the abdomen or pelvis. Incidental CT findings: Gallbladder is markedly enlarged measuring 5.6 cm in diameter. There is high-density material within the lumen of the gallbladder which appears to layer. There are are small cholesterol gallstones towards the neck of the gallbladder. SKELETON: No focal hypermetabolic activity to suggest skeletal metastasis. Incidental CT findings: None. IMPRESSION: 1. Smoothly marginated slowly enlarging LEFT upper lobe pulmonary nodule along the pleural surface. Moderate metabolic activity is present. Lesion does not appear to originate from the spine. Indeterminate lesion. Patient with history of thymic mass resection. This could represent local recurrence of benign or malignant thymoma or thyroid carcinoma. 2. Midline sternotomy. 3. Marked distension of the gallbladder with high-density fluid. Consider gallbladder ultrasound for further evaluation. Electronically Signed   By: Deboraha Fallow M.D.   On: 03/05/2024 12:34         PFTs:   - FVC: 51% - FEV1: 45% -DLCO: 41%   1. Smoothly marginated slowly enlarging LEFT upper lobe pulmonary nodule along the pleural surface. Moderate metabolic activity is present. Lesion does not appear to originate from the spine. Indeterminate lesion. Patient with history of thymic mass resection. This could represent local recurrence of benign or malignant thymoma  Assessment / Plan:   68yo male with left apical pulmonary nodule.   He has a hx of thymic carcinoma resection in 2018, and did not undergo adjuvant chemoradiation.  We discussed the possibility that this could recurrence of his thymic carcinoma or, primary lung cancer given his smoking history.,  Further explained that if this is primary lung cancer due to his lung function he would not be a candidate for resection without being on lifelong supplemental oxygen.  We discussed the risks and benefits of a robotic assisted bronchoscopy with biopsy of this nodule.  He is agreeable to proceed.

## 2024-05-10 NOTE — Op Note (Signed)
      [address]       [place] [address]             [phone]                                          05/10/2024 Patient:  Estell Heller Pre-Op Dx: left upper  lobe pulmonary nodule   Post-op Dx:  same Procedure: Flexible bronchoscopy Robotic assisted bronchoscopy with biopsy of the left lobe pulmonary nodule   Surgeon and Role:      * Tylee Yum, Marinell Siad, MD - Primary   Anesthesia  general EBL:  minimal    Indications: 68yo male with left apical pulmonary nodule. He has a hx of thymic carcinoma resection in 2018, and did not undergo adjuvant chemoradiation. We discussed the possibility that this could recurrence of his thymic carcinoma or, primary lung cancer given his smoking history., Further explained that if this is primary lung cancer due to his lung function he would not be a candidate for resection without being on lifelong supplemental oxygen. We discussed the risks and benefits of a robotic assisted bronchoscopy with biopsy of this nodule. He is agreeable to proceed.   Findings: Good registry.    Operative Technique: Prior to the date of the procedure a high-resolution CT scan of the chest was performed. Utilizing ION software program a virtual tracheobronchial tree was generated to allow the creation of distinct navigation pathways to the patient's parenchymal abnormalities.  The patient was brought to the endoscopy suite.  After anesthesia was induced, and time out was performed.  The video fiberoptic bronchoscope was introduced via the endotracheal tube and a general inspection was performed which showed normal right and left lung anatomy Aspiration of the bilateral mainstems was completed to remove any remaining secretions. The robotic catheter was then inserted into patient's endotracheal tube.   Target #1 left upper lobe: The distinct navigation pathways prepared prior to this procedure were then utilized to navigate to patient's lesion  identified on CT scan. The robotic catheter was secured into place and the vision probe was withdrawn.  Lesion location was approximated using fluoroscopy.  Local registration and targeting was performed using Cios three-dimensional imaging.  Under fluoroscopic guidance transbronchial brushings, and transbronchial needle biopsies, were performed to be sent for cytology and pathology.  Needle in lesion was confirmed using Cios. Specimens were sent for cytology.  The patient tolerated the procedure without any immediate complications, and was transferred to the recovery in stable condition.  Naja Apperson Ala Alice

## 2024-05-10 NOTE — Anesthesia Procedure Notes (Signed)
 Procedure Name: Intubation Date/Time: 05/10/2024 7:31 AM  Performed by: Lorrayne Rosier, RNPre-anesthesia Checklist: Patient identified, Emergency Drugs available, Suction available and Patient being monitored Patient Re-evaluated:Patient Re-evaluated prior to induction Oxygen Delivery Method: Circle system utilized Preoxygenation: Pre-oxygenation with 100% oxygen Induction Type: IV induction Ventilation: Mask ventilation without difficulty Laryngoscope Size: 4 and Mac Grade View: Grade II Tube type: Oral Tube size: 8.5 mm Number of attempts: 1 Airway Equipment and Method: Stylet Placement Confirmation: ETT inserted through vocal cords under direct vision, positive ETCO2 and breath sounds checked- equal and bilateral Secured at: 23 cm Tube secured with: Tape Dental Injury: Teeth and Oropharynx as per pre-operative assessment

## 2024-05-10 NOTE — Transfer of Care (Signed)
 Immediate Anesthesia Transfer of Care Note  Patient: Derrick Gaines  Procedure(s) Performed: VIDEO BRONCHOSCOPY WITH ENDOBRONCHIAL NAVIGATION BRONCHOSCOPY, WITH NEEDLE ASPIRATION BIOPSY BRONCHOSCOPY, WITH BRUSH BIOPSY  Patient Location: PACU  Anesthesia Type:General  Level of Consciousness: drowsy  Airway & Oxygen Therapy: Patient Spontanous Breathing and Patient connected to face mask  Post-op Assessment: Report given to RN and Post -op Vital signs reviewed and stable  Post vital signs: Reviewed and stable  Last Vitals:  Vitals Value Taken Time  BP 97/62 05/10/24 0849  Temp    Pulse 59 05/10/24 0851  Resp 27 05/10/24 0851  SpO2 91 % 05/10/24 0851  Vitals shown include unfiled device data.  Last Pain:  Vitals:   05/10/24 0622  PainSc: 0-No pain         Complications: No notable events documented.

## 2024-05-11 ENCOUNTER — Encounter (HOSPITAL_COMMUNITY): Payer: Self-pay | Admitting: Thoracic Surgery (Cardiothoracic Vascular Surgery)

## 2024-05-11 NOTE — Anesthesia Postprocedure Evaluation (Signed)
 Anesthesia Post Note  Patient: Derrick Gaines  Procedure(s) Performed: VIDEO BRONCHOSCOPY WITH ENDOBRONCHIAL NAVIGATION BRONCHOSCOPY, WITH NEEDLE ASPIRATION BIOPSY BRONCHOSCOPY, WITH BRUSH BIOPSY     Patient location during evaluation: PACU Anesthesia Type: General Level of consciousness: awake and alert Pain management: pain level controlled Vital Signs Assessment: post-procedure vital signs reviewed and stable Respiratory status: spontaneous breathing, nonlabored ventilation and respiratory function stable Cardiovascular status: blood pressure returned to baseline and stable Postop Assessment: no apparent nausea or vomiting Anesthetic complications: no   No notable events documented.  Last Vitals:  Vitals:   05/10/24 0930 05/10/24 0945  BP: 114/78 115/75  Pulse: 61 (!) 59  Resp: 16 16  Temp:  36.5 C  SpO2: 96% 98%    Last Pain:  Vitals:   05/10/24 0945  PainSc: 0-No pain                 Erin Havers

## 2024-05-14 LAB — CYTOLOGY - NON PAP

## 2024-05-17 ENCOUNTER — Inpatient Hospital Stay: Attending: Hematology | Admitting: Hematology

## 2024-05-17 VITALS — BP 165/89 | HR 84 | Temp 98.3°F | Resp 18 | Wt 233.2 lb

## 2024-05-17 DIAGNOSIS — C9000 Multiple myeloma not having achieved remission: Secondary | ICD-10-CM | POA: Insufficient documentation

## 2024-05-17 DIAGNOSIS — D4989 Neoplasm of unspecified behavior of other specified sites: Secondary | ICD-10-CM | POA: Diagnosis not present

## 2024-05-17 DIAGNOSIS — D3501 Benign neoplasm of right adrenal gland: Secondary | ICD-10-CM | POA: Diagnosis not present

## 2024-05-17 NOTE — Patient Instructions (Signed)
 [facility] at [facility] Discharge Instructions   You were seen and examined today by Dr. Cheree Cords.  He reviewed the results of your recent biopsy which did not show any cancer cells.   We will see you back in June as scheduled. We will repeat lab work prior to this visit.   Return as scheduled.    Thank you for choosing [facility] at [facility] to provide your oncology and hematology care.  To afford each patient quality time with our provider, please arrive at least 15 minutes before your scheduled appointment time.   If you have a lab appointment with the Cancer Center please come in thru the Main Entrance and check in at the main information desk.  You need to re-schedule your appointment should you arrive 10 or more minutes late.  We strive to give you quality time with our providers, and arriving late affects you and other patients whose appointments are after yours.  Also, if you no show three or more times for appointments you may be dismissed from the clinic at the providers discretion.     Again, thank you for choosing [facility].  Our hope is that these requests will decrease the amount of time that you wait before being seen by our physicians.       _____________________________________________________________  Should you have questions after your visit to [facility], please contact our office at [phone] and follow the prompts.  Our office hours are 8:00 a.m. and 4:30 p.m. Monday - Friday.  Please note that voicemails left after 4:00 p.m. may not be returned until the following business day.  We are closed weekends and major holidays.  You do have access to a nurse 24-7, just call the main number to the clinic [phone] and do not press any options, hold on the line and a nurse will answer the phone.    For prescription refill requests, have your pharmacy contact our office and allow 72 hours.     Due to Covid, you will need to wear a mask upon entering the hospital. If you do not have a mask, a mask will be given to you at the Main Entrance upon arrival. For doctor visits, patients may have 1 support person age 49 or older with them. For treatment visits, patients can not have anyone with them due to social distancing guidelines and our immunocompromised population.

## 2024-05-17 NOTE — Progress Notes (Signed)
 [facility] [address], [place] [address]    Clinic Day:  05/17/2024  Referring physician: Meldon Sport, MD  Patient Care Team: Meldon Sport, MD as PCP - General (Internal Medicine) Amanda Jungling Joyceann No, MD as PCP - Cardiology (Cardiology) Paulett Boros, MD as Consulting Physician (Hematology)   ASSESSMENT & PLAN:   Assessment: 1.  Poorly differentiated thymic carcinoma: -Presentation with weight loss of 40 pounds, PET/CT scan on 10/05/2016 showed anterior mediastinal mass measuring 3.6 x 1.9 cm with SUV of 6.3, status post resection on 12/29/2016 -Pathology (12/29/2016): Showed poorly differentiated thymic carcinoma, with foci of squamous differentiation, negative margin for lung tissue, negative margin for pericardial and pleura, one lymph node negative, pT2pN0M0, adjuvant chemoradiation therapy was recommended, patient declined.   -Last follow-up at [facility] hospital in December, CT scan of the chest with contrast on 12/14/2017 shows no evidence of recurrence or metastatic disease.  Scattered small pulmonary nodules are unchanged from 09/22/2016.   -Family history with one sister died of colon cancer in her 59s, another sister with metastatic cancer at this time     2.  Tobacco abuse: Patient smoked 1 pack/day for the past 44 years.  He reports that he cut back to half pack per day since his surgery.    3. Abnormal free light chains:  - Patient seen at the request of Dr. Carrolyn Clan for abnormal free light chains. - Labs on 04/07/2021: SPEP-poorly defined band of restricted protein mobility in the gamma region.  SIFX-negative.  FLC ratio 4.22, kappa light chains 137, lambda light chain 32.5.  Creatinine was 1.41.  Hemoglobin was normal. -No neuropathy in the extremities.  Plan: 1.  Poorly differentiated thymic carcinoma: - Recent CT scan of the chest on 01/19/2024 showed increasing left upper lobe posterior nodule. - A subsequent PET scan on 05/10/2024: Apical  pleural/ex left pleural nodule in the left hemithorax slightly increased from 08/04/2022, minimally hypermetabolic with SUV 2.7. - He underwent bronchoscopy and biopsy on 05/10/2024: FNA of the LUL lung nodule shows no malignant cells. - CT chest on 04/26/2024 showed apical pleural/extrapleural nodule of the left hemithorax measuring 2.6 x 1.7 cm.  Right adrenal adenoma.  No adenopathy. - I would recommend follow-up in 1 year with CT chest.   2.  Abnormal free light chain ratio: - Previous labs from December 2024 showed 24-hour urine to be 8 to 3 mg.  No evidence of M spike.  However his FLC ratio was elevated at 6.15, kappa light chains 202 and lambda light chains 33.  Immunofixation was polyclonal.  M spike was not detected. - He will repeat myeloma labs and 24-hour urine in June.  If FLC ratio is remaining high, will consider bone marrow biopsy.  Orders Placed This Encounter  Procedures   CT CHEST W CONTRAST    Standing Status:   Future    Expected Date:   05/13/2025    Expiration Date:   05/17/2025    If indicated for the ordered procedure, I authorize the administration of contrast media per Radiology protocol:   Yes    Does the patient have a contrast media/X-ray dye allergy?:   No    Preferred imaging location?:   [employer] R Teague,acting as a scribe for Paulett Boros, MD.,have documented all relevant documentation on the behalf of Paulett Boros, MD,as directed by  Paulett Boros, MD while in the presence of Paulett Boros, MD.  I, Paulett Boros MD, have reviewed the above documentation for accuracy and completeness, and I agree with the above.   Paulett Boros, MD   5/22/20253:57 PM  CHIEF COMPLAINT:   Diagnosis: Poorly differentiated thymic carcinoma and elevated free light chains  Prior Therapy: None  Current Therapy:  surveillance   HISTORY OF PRESENT ILLNESS:   Oncology History   No history exists.      INTERVAL HISTORY:   Derrick Gaines is a 68 y.o. male presenting to clinic today for follow up of thymic carcinoma and elevated free light chains. He was last seen by me on 08/11/22 and [place] PA on 12/15/23.  Since his last visit, he had CT chest on 01/19/24 that found: Postsurgical change in the mediastinum without evidence of mediastinal mass or adenopathy. Slow growth of pleural based nodule the left lung apex without aggressive characteristics. Differential considerations remain neurogenic lesion, solitary fibrous tumor of the thorax, or noncalcified pleural plaque. Consider MRI of the thoracic spine without and with IV contrast as clinically indicated versus continued surveillance. Mild emphysema.  MRI of thoracic spine done on 02/06/24 showed: Pleural based nodule of the posterior superior left hemithorax measures 1.9 x 1.0 cm. No invasion into the paraspinous structures. Mild thoracic degenerative disc disease without spinal canal stenosis or neural foraminal narrowing.  Initial PET done on 03/01/24 that found: Smoothly marginated slowly enlarging LEFT upper lobe pulmonary nodule along the pleural surface. Moderate metabolic activity is present. Lesion does not appear to originate from the spine. Indeterminate lesion. Patient with history of thymic mass resection. Midline sternotomy. Marked distension of the gallbladder with high-density fluid.  Brigid Canada underwent bronchoscopy with biopsy of a previously seen lung nodule on 05/10/24 with Dr. Deloise Ferries. Cytology of the biopsy showed: no malignant cell and benign bronchial cells with rare macrophages.   Today, he states that he is doing well overall. His appetite level is at 100%. His energy level is at 60%.  PAST MEDICAL HISTORY:   Past Medical History: Past Medical History:  Diagnosis Date   Arthritis    Cancer (HCC)    Chronic kidney disease    Dementia (HCC)    Hypertension    Stroke (HCC)    mild left sided weakness   Vocal cord polyp      Surgical History: Past Surgical History:  Procedure Laterality Date   BACK SURGERY     BRONCHIAL BRUSHINGS  05/10/2024   Procedure: BRONCHOSCOPY, WITH BRUSH BIOPSY;  Surgeon: Hilarie Lovely, MD;  Location: [facility];  Service: Thoracic;;   BRONCHIAL NEEDLE ASPIRATION BIOPSY  05/10/2024   Procedure: BRONCHOSCOPY, WITH NEEDLE ASPIRATION BIOPSY;  Surgeon: Hilarie Lovely, MD;  Location: [facility];  Service: Thoracic;;   MICROLARYNGOSCOPY N/A 08/29/2018   Procedure: DIRECT MICROLARYNGOSCOPY WITH EXCISION OF LARYNGEAL MASS;  Surgeon: Reynold Caves, MD;  Location: [facility];  Service: ENT;  Laterality: N/A;   STERNOTOMY     THYMECTOMY     VIDEO BRONCHOSCOPY WITH ENDOBRONCHIAL NAVIGATION N/A 05/10/2024   Procedure: VIDEO BRONCHOSCOPY WITH ENDOBRONCHIAL NAVIGATION;  Surgeon: Hilarie Lovely, MD;  Location: [facility];  Service: Thoracic;  Laterality: N/A;    Social History: Social History   Socioeconomic History   Marital status: Married    Spouse name: Not on file   Number of children: Not on file   Years of education: Not on file   Highest education level: Not on file  Occupational History   Not on file  Tobacco Use   Smoking  status: Every Day    Current packs/day: 0.50    Average packs/day: 0.5 packs/day for 30.0 years (15.0 ttl pk-yrs)    Types: Cigarettes   Smokeless tobacco: Never   Tobacco comments:    1 ppd as of 02/24/24  Vaping Use   Vaping status: Never Used  Substance and Sexual Activity   Alcohol  use: No   Drug use: No   Sexual activity: Yes  Other Topics Concern   Not on file  Social History Narrative   Been on disability since age 34 due to a stroke.    Used to work at [employer].    Married. No children. Married for 15 years.   Smoke cigarettes, since age 23 years.    No alcohol . No drugs.   Enjoys fishing.    Attends church.   Eats all foods.   Wear seatbelt.    Drives.    Right handed   One story home   Lives with wife  , and son and wifes two sisters   Social Drivers of Corporate investment banker Strain: Not on file  Food Insecurity: No Food Insecurity (04/27/2023)   Hunger Vital Sign    Worried About Running Out of Food in the Last Year: Never true    Ran Out of Food in the Last Year: Never true  Transportation Needs: No Transportation Needs (04/27/2023)   PRAPARE - Administrator, Civil Service (Medical): No    Lack of Transportation (Non-Medical): No  Physical Activity: Not on file  Stress: Not on file  Social Connections: Not on file  Intimate Partner Violence: Not At Risk (04/27/2023)   Humiliation, Afraid, Rape, and Kick questionnaire    Fear of Current or Ex-Partner: No    Emotionally Abused: No    Physically Abused: No    Sexually Abused: No    Family History: Family History  Problem Relation Age of Onset   Diabetes Mother    Cancer Mother    Colon cancer Sister    Pneumonia Father    Emphysema Brother     Current Medications:  Current Outpatient Medications:    amLODipine  (NORVASC ) 10 MG tablet, Take 1 tablet (10 mg total) by mouth daily., Disp: 90 tablet, Rfl: 1   aspirin  EC 81 MG tablet, Take 81 mg by mouth daily. Swallow whole., Disp: , Rfl:    atorvastatin  (LIPITOR) 40 MG tablet, Take 1 tablet (40 mg total) by mouth daily., Disp: 90 tablet, Rfl: 1   Cyanocobalamin  (B-12) 1000 MCG TABS, Take by mouth., Disp: , Rfl:    DULoxetine  (CYMBALTA ) 30 MG capsule, Take 1 capsule (30 mg total) by mouth daily., Disp: 30 capsule, Rfl: 5   hydrochlorothiazide  (HYDRODIURIL ) 12.5 MG tablet, TAKE 1 TABLET BY MOUTH EVERY DAY, Disp: 30 tablet, Rfl: 5   losartan  (COZAAR ) 25 MG tablet, TAKE 1 TABLET (25 MG TOTAL) BY MOUTH DAILY., Disp: 30 tablet, Rfl: 5   memantine  (NAMENDA ) 10 MG tablet, Take 1 tablet (10 mg at night) for 2 weeks, then increase to 1 tablet (10 mg) twice a day, Disp: 60 tablet, Rfl: 11   Riboflavin (B-2) 100 MG TABS, Take by mouth., Disp: , Rfl:    thiamine  (VITAMIN B1)  100 MG tablet, Take 1 tablet (100 mg total) by mouth daily., Disp: 90 tablet, Rfl: 3   Allergies: No Known Allergies  REVIEW OF SYSTEMS:   Review of Systems  Constitutional:  Negative for chills, fatigue and fever.  HENT:  Negative for lump/mass, mouth sores, nosebleeds, sore throat and trouble swallowing.   Eyes:  Negative for eye problems.  Respiratory:  Negative for cough and shortness of breath.   Cardiovascular:  Negative for chest pain, leg swelling and palpitations.  Gastrointestinal:  Negative for abdominal pain, constipation, diarrhea, nausea and vomiting.  Genitourinary:  Negative for bladder incontinence, difficulty urinating, dysuria, frequency, hematuria and nocturia.   Musculoskeletal:  Negative for arthralgias, back pain, flank pain, myalgias and neck pain.  Skin:  Negative for itching and rash.  Neurological:  Negative for dizziness, headaches and numbness.  Hematological:  Does not bruise/bleed easily.  Psychiatric/Behavioral:  Negative for depression, sleep disturbance and suicidal ideas. The patient is not nervous/anxious.   All other systems reviewed and are negative.    VITALS:   Blood pressure (!) 165/89, pulse 84, temperature 98.3 F (36.8 C), temperature source Oral, resp. rate 18, weight 233 lb 4 oz (105.8 kg), SpO2 94%.  Wt Readings from Last 3 Encounters:  05/17/24 233 lb 4 oz (105.8 kg)  05/10/24 235 lb (106.6 kg)  05/08/24 233 lb 9.6 oz (106 kg)    Body mass index is 31.63 kg/m.  Performance status (ECOG): 1 - Symptomatic but completely ambulatory  PHYSICAL EXAM:   Physical Exam Vitals and nursing note reviewed. Exam conducted with a chaperone present.  Constitutional:      Appearance: Normal appearance.  Cardiovascular:     Rate and Rhythm: Normal rate and regular rhythm.     Pulses: Normal pulses.     Heart sounds: Normal heart sounds.  Pulmonary:     Effort: Pulmonary effort is normal.     Breath sounds: Normal breath sounds.   Abdominal:     Palpations: Abdomen is soft. There is no hepatomegaly, splenomegaly or mass.     Tenderness: There is no abdominal tenderness.  Musculoskeletal:     Right lower leg: No edema.     Left lower leg: No edema.  Lymphadenopathy:     Cervical: No cervical adenopathy.     Right cervical: No superficial, deep or posterior cervical adenopathy.    Left cervical: No superficial, deep or posterior cervical adenopathy.     Upper Body:     Right upper body: No supraclavicular or axillary adenopathy.     Left upper body: No supraclavicular or axillary adenopathy.  Neurological:     General: No focal deficit present.     Mental Status: He is alert and oriented to person, place, and time.  Psychiatric:        Mood and Affect: Mood normal.        Behavior: Behavior normal.     LABS:   CBC     Component Value Date/Time   WBC 6.1 05/08/2024 1023   RBC 4.69 05/08/2024 1023   HGB 15.2 05/08/2024 1023   HGB 15.0 02/21/2024 1445   HCT 47.3 05/08/2024 1023   HCT 44.6 02/21/2024 1445   PLT 320 05/08/2024 1023   PLT 337 02/21/2024 1445   MCV 100.9 (H) 05/08/2024 1023   MCV 98 (H) 02/21/2024 1445   MCH 32.4 05/08/2024 1023   MCHC 32.1 05/08/2024 1023   RDW 12.6 05/08/2024 1023   RDW 11.7 02/21/2024 1445   LYMPHSABS 2.8 02/21/2024 1445   MONOABS 0.5 12/08/2023 1310   EOSABS 0.2 02/21/2024 1445   BASOSABS 0.1 02/21/2024 1445    CMP      Component Value Date/Time   NA 138 05/08/2024 1023   NA 140  02/21/2024 1445   K 3.7 05/08/2024 1023   CL 103 05/08/2024 1023   CO2 28 05/08/2024 1023   GLUCOSE 118 (H) 05/08/2024 1023   BUN 17 05/08/2024 1023   BUN 18 02/21/2024 1445   CREATININE 1.41 (H) 05/08/2024 1023   CREATININE 1.50 (H) 08/16/2018 1514   CALCIUM  9.5 05/08/2024 1023   PROT 7.8 05/08/2024 1023   PROT 7.3 02/21/2024 1445   ALBUMIN 3.8 05/08/2024 1023   ALBUMIN 4.1 02/21/2024 1445   AST 21 05/08/2024 1023   ALT 17 05/08/2024 1023   ALKPHOS 57 05/08/2024 1023    BILITOT 0.5 05/08/2024 1023   BILITOT 0.4 02/21/2024 1445   GFRNONAA 55 (L) 05/08/2024 1023   GFRNONAA 50 (L) 08/16/2018 1514   GFRAA 54 (L) 12/04/2019 1418   GFRAA 57 (L) 08/16/2018 1514     No results found for: "CEA1", "CEA" / No results found for: "CEA1", "CEA" Lab Results  Component Value Date   PSA1 3.5 02/21/2024   No results found for: "ZOX096" No results found for: "EAV409"  Lab Results  Component Value Date   TOTALPROTELP 7.6 12/08/2023   TOTALPROTELP 7.5 12/08/2023   ALBUMINELP 3.9 12/08/2023   A1GS 0.3 12/08/2023   A2GS 0.7 12/08/2023   BETS 1.0 12/08/2023   GAMS 1.7 12/08/2023   MSPIKE Not Observed 12/08/2023   SPEI Comment 12/08/2023   Lab Results  Component Value Date   FERRITIN 983 (H) 11/21/2020   Lab Results  Component Value Date   LDH 115 12/08/2023   LDH 121 02/14/2023   LDH 132 06/04/2022     STUDIES:   CT Super D Chest Wo Contrast Result Date: 05/11/2024 CLINICAL DATA:  Lung nodule. EXAM: CT CHEST WITHOUT CONTRAST TECHNIQUE: Multidetector CT imaging of the chest was performed using thin slice collimation for electromagnetic bronchoscopy planning purposes, without intravenous contrast. RADIATION DOSE REDUCTION: This exam was performed according to the departmental dose-optimization program which includes automated exposure control, adjustment of the mA and/or kV according to patient size and/or use of iterative reconstruction technique. COMPARISON:  PET 03/01/2024, MR thoracic spine 02/06/2024, CT chest 01/19/2024 and 08/04/2022. FINDINGS: Cardiovascular: Atherosclerotic calcification of the aorta and coronary arteries. Enlarged right and left pulmonary arteries. Heart size normal. No pericardial effusion. Mediastinum/Nodes: No pathologically enlarged mediastinal or axillary lymph nodes. Hilar regions are difficult to definitively evaluate without IV contrast. Esophagus is grossly unremarkable. Lungs/Pleura: Centrilobular and paraseptal emphysema.  Apical soft tissue nodule in the posterior apical left hemithorax measures 1.7 x 2.6 cm, minimally enlarged but present dating back to 08/04/2022, at which time it measured 1.0 x 2.0 cm. A few scattered tiny pulmonary nodules measure 4 mm or less in size, unchanged from 08/04/2022. Per Fleischner Society guidelines, no follow-up is necessary. No pleural fluid. Debris in the airway. Upper Abdomen: 2.4 cm right adrenal nodule measures 13 Hounsfield units. Slight thickening of the left adrenal gland. No specific follow-up necessary. Visualized portions of the liver, adrenal glands, kidneys, spleen, pancreas, stomach and bowel are otherwise grossly unremarkable. No upper abdominal adenopathy. Musculoskeletal: Degenerative changes in the spine. IMPRESSION: 1. Apical pleural/extrapleural nodule in the left hemithorax, slightly enlarged from 08/04/2022 and minimally hypermetabolic on 03/01/2024. Indolent malignancy cannot be excluded. Lesion was subsequently biopsied on 05/10/2024 with pathology pending. 2. Right adrenal adenoma. 3. Aortic atherosclerosis (ICD10-I70.0). Coronary artery calcification. 4. Enlarged right and left pulmonary arteries, indicative of pulmonary arterial hypertension. 5.  Emphysema (ICD10-J43.9). Electronically Signed   By: Shearon Denis M.D.   On:  05/11/2024 10:20   DG CHEST PORT 1 VIEW Result Date: 05/10/2024 CLINICAL DATA:  Status post bronchoscopy. EXAM: PORTABLE CHEST 1 VIEW COMPARISON:  May 08, 2024. FINDINGS: Stable cardiomediastinal silhouette. Sternotomy wires are noted. Minimal bibasilar subsegmental atelectasis is noted. Bony thorax is unremarkable. IMPRESSION: Minimal bibasilar subsegmental atelectasis. Electronically Signed   By: Rosalene Colon M.D.   On: 05/10/2024 09:18   DG C-ARM BRONCHOSCOPY Result Date: 05/10/2024 C-ARM BRONCHOSCOPY: Fluoroscopy was utilized by the requesting physician.  No radiographic interpretation.   DG Chest 2 View Result Date: 05/08/2024 CLINICAL  DATA:  68 year old male preop chest x-ray EXAM: CHEST - 2 VIEW COMPARISON:  04/27/2023 Chest CT 04/26/2024, PET-CT 03/01/2024 FINDINGS: Cardiomediastinal silhouette unchanged in size and contour. Surgical changes of median sternotomy. No evidence of central vascular congestion. No interlobular septal thickening. No pneumothorax or pleural effusion. Coarsened interstitial markings, with no confluent airspace disease. No acute displaced fracture. Degenerative changes of the spine. IMPRESSION: Negative for acute cardiopulmonary disease Electronically Signed   By: Myrlene Asper D.O.   On: 05/08/2024 12:12

## 2024-06-01 ENCOUNTER — Ambulatory Visit: Admitting: Thoracic Surgery (Cardiothoracic Vascular Surgery)

## 2024-06-06 ENCOUNTER — Inpatient Hospital Stay: Payer: Medicare PPO | Attending: Hematology

## 2024-06-06 DIAGNOSIS — Z85238 Personal history of other malignant neoplasm of thymus: Secondary | ICD-10-CM | POA: Diagnosis not present

## 2024-06-06 DIAGNOSIS — C9000 Multiple myeloma not having achieved remission: Secondary | ICD-10-CM | POA: Diagnosis present

## 2024-06-06 DIAGNOSIS — D472 Monoclonal gammopathy: Secondary | ICD-10-CM

## 2024-06-06 DIAGNOSIS — Z79899 Other long term (current) drug therapy: Secondary | ICD-10-CM | POA: Diagnosis not present

## 2024-06-06 DIAGNOSIS — D3501 Benign neoplasm of right adrenal gland: Secondary | ICD-10-CM | POA: Diagnosis not present

## 2024-06-06 DIAGNOSIS — F17210 Nicotine dependence, cigarettes, uncomplicated: Secondary | ICD-10-CM | POA: Diagnosis not present

## 2024-06-06 DIAGNOSIS — R911 Solitary pulmonary nodule: Secondary | ICD-10-CM | POA: Insufficient documentation

## 2024-06-06 DIAGNOSIS — R768 Other specified abnormal immunological findings in serum: Secondary | ICD-10-CM

## 2024-06-06 LAB — CBC WITH DIFFERENTIAL/PLATELET
Abs Immature Granulocytes: 0.07 10*3/uL (ref 0.00–0.07)
Basophils Absolute: 0.1 10*3/uL (ref 0.0–0.1)
Basophils Relative: 1 %
Eosinophils Absolute: 0.2 10*3/uL (ref 0.0–0.5)
Eosinophils Relative: 2 %
HCT: 46.7 % (ref 39.0–52.0)
Hemoglobin: 15.2 g/dL (ref 13.0–17.0)
Immature Granulocytes: 1 %
Lymphocytes Relative: 41 %
Lymphs Abs: 3.1 10*3/uL (ref 0.7–4.0)
MCH: 33.1 pg (ref 26.0–34.0)
MCHC: 32.5 g/dL (ref 30.0–36.0)
MCV: 101.7 fL — ABNORMAL HIGH (ref 80.0–100.0)
Monocytes Absolute: 0.8 10*3/uL (ref 0.1–1.0)
Monocytes Relative: 11 %
Neutro Abs: 3.4 10*3/uL (ref 1.7–7.7)
Neutrophils Relative %: 44 %
Platelets: 306 10*3/uL (ref 150–400)
RBC: 4.59 MIL/uL (ref 4.22–5.81)
RDW: 12.8 % (ref 11.5–15.5)
WBC: 7.6 10*3/uL (ref 4.0–10.5)
nRBC: 0 % (ref 0.0–0.2)

## 2024-06-06 LAB — COMPREHENSIVE METABOLIC PANEL WITH GFR
ALT: 18 U/L (ref 0–44)
AST: 18 U/L (ref 15–41)
Albumin: 3.9 g/dL (ref 3.5–5.0)
Alkaline Phosphatase: 64 U/L (ref 38–126)
Anion gap: 9 (ref 5–15)
BUN: 21 mg/dL (ref 8–23)
CO2: 26 mmol/L (ref 22–32)
Calcium: 9.5 mg/dL (ref 8.9–10.3)
Chloride: 103 mmol/L (ref 98–111)
Creatinine, Ser: 1.51 mg/dL — ABNORMAL HIGH (ref 0.61–1.24)
GFR, Estimated: 51 mL/min — ABNORMAL LOW (ref >60)
Glucose, Bld: 101 mg/dL — ABNORMAL HIGH (ref 70–99)
Potassium: 3.5 mmol/L (ref 3.5–5.1)
Sodium: 138 mmol/L (ref 135–145)
Total Bilirubin: 0.7 mg/dL (ref 0.0–1.2)
Total Protein: 7.5 g/dL (ref 6.5–8.1)

## 2024-06-06 LAB — LACTATE DEHYDROGENASE: LDH: 124 U/L (ref 98–192)

## 2024-06-07 LAB — KAPPA/LAMBDA LIGHT CHAINS
Kappa free light chain: 158.8 mg/L — ABNORMAL HIGH (ref 3.3–19.4)
Kappa, lambda light chain ratio: 4.67 — ABNORMAL HIGH (ref 0.26–1.65)
Lambda free light chains: 34 mg/L — ABNORMAL HIGH (ref 5.7–26.3)

## 2024-06-07 LAB — PROTEIN ELECTROPHORESIS, SERUM
A/G Ratio: 1 (ref 0.7–1.7)
Albumin ELP: 3.7 g/dL (ref 2.9–4.4)
Alpha-1-Globulin: 0.2 g/dL (ref 0.0–0.4)
Alpha-2-Globulin: 0.7 g/dL (ref 0.4–1.0)
Beta Globulin: 1 g/dL (ref 0.7–1.3)
Gamma Globulin: 1.6 g/dL (ref 0.4–1.8)
Globulin, Total: 3.6 g/dL (ref 2.2–3.9)
Total Protein ELP: 7.3 g/dL (ref 6.0–8.5)

## 2024-06-13 NOTE — Progress Notes (Signed)
 [facility] [address], [place] [address]    Clinic Day:  06/14/2024  Referring physician: Tobie Suzzane POUR, MD  Patient Care Team: Tobie Suzzane POUR, MD as PCP - General (Internal Medicine) Alvan Dorn FALCON, MD as PCP - Cardiology (Cardiology) Rogers Hai, MD as Consulting Physician (Hematology)   ASSESSMENT & PLAN:   Assessment: 1.  Poorly differentiated thymic carcinoma: -Presentation with weight loss of 40 pounds, PET/CT scan on 10/05/2016 showed anterior mediastinal mass measuring 3.6 x 1.9 cm with SUV of 6.3, status post resection on 12/29/2016 -Pathology (12/29/2016): Showed poorly differentiated thymic carcinoma, with foci of squamous differentiation, negative margin for lung tissue, negative margin for pericardial and pleura, one lymph node negative, pT2pN0M0, adjuvant chemoradiation therapy was recommended, patient declined.   -Last follow-up at [facility] hospital in December, CT scan of the chest with contrast on 12/14/2017 shows no evidence of recurrence or metastatic disease.  Scattered small pulmonary nodules are unchanged from 09/22/2016.   -Family history with one sister died of colon cancer in her 47s, another sister with metastatic cancer at this time     2.  Tobacco abuse: Patient smoked 1 pack/day for the past 44 years.  He reports that he cut back to half pack per day since his surgery.    3. Abnormal free light chains:  - Patient seen at the request of Dr. Rachele for abnormal free light chains. - Labs on 04/07/2021: SPEP-poorly defined band of restricted protein mobility in the gamma region.  SIFX-negative.  FLC ratio 4.22, kappa light chains 137, lambda light chain 32.5.  Creatinine was 1.41.  Hemoglobin was normal. -No neuropathy in the extremities.  Plan: 1.  Poorly differentiated thymic carcinoma: - Recent CT scan of the chest on 01/19/2024 showed increasing left upper lobe posterior nodule. - A subsequent PET scan on 05/10/2024: Apical  pleural/ex left pleural nodule in the left hemithorax slightly increased from 08/04/2022, minimally hypermetabolic with SUV 2.7. - He underwent bronchoscopy and biopsy on 05/10/2024: FNA of the LUL lung nodule shows no malignant cells. - CT chest on 04/26/2024 showed apical pleural/extrapleural nodule of the left hemithorax measuring 2.6 x 1.7 cm.  Right adrenal adenoma.  No adenopathy. - I will repeat CT chest without contrast in May 2026.   2.  Abnormal free light chain ratio: - Previous 24-hour urine December 2024 showed no evidence of M spike.  He forgot to do the 24-hour urine this time. - Labs from 06/06/2024: Kappa light chains 158, lambda light chains 34 and ratio 4.67, improved from 6.14 previously.  Hemoglobin is normal.  Creatinine stable at 1.51.  Calcium  is also in the normal range.  Will monitor his myeloma labs and repeat 24-hour urine in 6 months.  Orders Placed This Encounter  Procedures   24 hr, Ur UPEP/UIFE/Light Chains/TP    Standing Status:   Future    Expected Date:   12/10/2024    Expiration Date:   03/10/2025   Kappa/lambda light chains    Standing Status:   Future    Expected Date:   12/10/2024    Expiration Date:   03/10/2025   Immunofixation electrophoresis    Standing Status:   Future    Expected Date:   12/10/2024    Expiration Date:   03/10/2025   Protein electrophoresis, serum    Standing Status:   Future    Expected Date:   12/10/2024    Expiration Date:   03/10/2025   CBC with  Differential    Standing Status:   Future    Expected Date:   12/10/2024    Expiration Date:   03/10/2025   Comprehensive metabolic panel    Standing Status:   Future    Expected Date:   12/10/2024    Expiration Date:   03/10/2025      LILLETTE Hummingbird R Teague,acting as a scribe for Alean Stands, MD.,have documented all relevant documentation on the behalf of Alean Stands, MD,as directed by  Alean Stands, MD while in the presence of Alean Stands, MD.  I,  Alean Stands MD, have reviewed the above documentation for accuracy and completeness, and I agree with the above.    Alean Stands, MD   6/19/20255:17 PM  CHIEF COMPLAINT:   Diagnosis: Poorly differentiated thymic carcinoma and elevated free light chains  Prior Therapy: None  Current Therapy:  surveillance   HISTORY OF PRESENT ILLNESS:   Oncology History   No history exists.     INTERVAL HISTORY:   Derrick Gaines is a 68 y.o. male presenting to clinic today for follow up of thymic carcinoma and elevated free light chains. He was last seen by me on 05/17/24.  Today, he states that he is doing well overall. His appetite level is at 100%. His energy level is at 100%.  PAST MEDICAL HISTORY:   Past Medical History: Past Medical History:  Diagnosis Date   Arthritis    Cancer (HCC)    Chronic kidney disease    Dementia (HCC)    Hypertension    Stroke (HCC)    mild left sided weakness   Vocal cord polyp     Surgical History: Past Surgical History:  Procedure Laterality Date   BACK SURGERY     BRONCHIAL BRUSHINGS  05/10/2024   Procedure: BRONCHOSCOPY, WITH BRUSH BIOPSY;  Surgeon: Shyrl Linnie KIDD, MD;  Location: [facility];  Service: Thoracic;;   BRONCHIAL NEEDLE ASPIRATION BIOPSY  05/10/2024   Procedure: BRONCHOSCOPY, WITH NEEDLE ASPIRATION BIOPSY;  Surgeon: Shyrl Linnie KIDD, MD;  Location: [facility];  Service: Thoracic;;   MICROLARYNGOSCOPY N/A 08/29/2018   Procedure: DIRECT MICROLARYNGOSCOPY WITH EXCISION OF LARYNGEAL MASS;  Surgeon: Karis Clunes, MD;  Location: [facility];  Service: ENT;  Laterality: N/A;   STERNOTOMY     THYMECTOMY     VIDEO BRONCHOSCOPY WITH ENDOBRONCHIAL NAVIGATION N/A 05/10/2024   Procedure: VIDEO BRONCHOSCOPY WITH ENDOBRONCHIAL NAVIGATION;  Surgeon: Shyrl Linnie KIDD, MD;  Location: [facility];  Service: Thoracic;  Laterality: N/A;    Social History: Social History   Socioeconomic History   Marital status:  Married    Spouse name: Not on file   Number of children: Not on file   Years of education: Not on file   Highest education level: Not on file  Occupational History   Not on file  Tobacco Use   Smoking status: Every Day    Current packs/day: 0.50    Average packs/day: 0.5 packs/day for 30.0 years (15.0 ttl pk-yrs)    Types: Cigarettes   Smokeless tobacco: Never   Tobacco comments:    1 ppd as of 02/24/24  Vaping Use   Vaping status: Never Used  Substance and Sexual Activity   Alcohol  use: No   Drug use: No   Sexual activity: Yes  Other Topics Concern   Not on file  Social History Narrative   Been on disability since age 51 due to a stroke.    Used to work at [employer].    Married.  No children. Married for 15 years.   Smoke cigarettes, since age 51 years.    No alcohol . No drugs.   Enjoys fishing.    Attends church.   Eats all foods.   Wear seatbelt.    Drives.    Right handed   One story home   Lives with wife , and son and wifes two sisters   Social Drivers of Corporate investment banker Strain: Not on file  Food Insecurity: No Food Insecurity (04/27/2023)   Hunger Vital Sign    Worried About Running Out of Food in the Last Year: Never true    Ran Out of Food in the Last Year: Never true  Transportation Needs: No Transportation Needs (04/27/2023)   PRAPARE - Administrator, Civil Service (Medical): No    Lack of Transportation (Non-Medical): No  Physical Activity: Not on file  Stress: Not on file  Social Connections: Not on file  Intimate Partner Violence: Not At Risk (04/27/2023)   Humiliation, Afraid, Rape, and Kick questionnaire    Fear of Current or Ex-Partner: No    Emotionally Abused: No    Physically Abused: No    Sexually Abused: No    Family History: Family History  Problem Relation Age of Onset   Diabetes Mother    Cancer Mother    Colon cancer Sister    Pneumonia Father    Emphysema Brother     Current Medications:  Current  Outpatient Medications:    amLODipine  (NORVASC ) 10 MG tablet, Take 1 tablet (10 mg total) by mouth daily., Disp: 90 tablet, Rfl: 1   aspirin  EC 81 MG tablet, Take 81 mg by mouth daily. Swallow whole., Disp: , Rfl:    atorvastatin  (LIPITOR) 40 MG tablet, Take 1 tablet (40 mg total) by mouth daily., Disp: 90 tablet, Rfl: 1   Cyanocobalamin  (B-12) 1000 MCG TABS, Take by mouth., Disp: , Rfl:    DULoxetine  (CYMBALTA ) 30 MG capsule, Take 1 capsule (30 mg total) by mouth daily., Disp: 30 capsule, Rfl: 5   hydrochlorothiazide  (HYDRODIURIL ) 12.5 MG tablet, TAKE 1 TABLET BY MOUTH EVERY DAY, Disp: 30 tablet, Rfl: 5   losartan  (COZAAR ) 25 MG tablet, TAKE 1 TABLET (25 MG TOTAL) BY MOUTH DAILY., Disp: 30 tablet, Rfl: 5   memantine  (NAMENDA ) 10 MG tablet, Take 1 tablet (10 mg at night) for 2 weeks, then increase to 1 tablet (10 mg) twice a day, Disp: 60 tablet, Rfl: 11   Riboflavin (B-2) 100 MG TABS, Take by mouth., Disp: , Rfl:    thiamine  (VITAMIN B1) 100 MG tablet, Take 1 tablet (100 mg total) by mouth daily., Disp: 90 tablet, Rfl: 3   Allergies: No Known Allergies  REVIEW OF SYSTEMS:   Review of Systems  Constitutional:  Negative for chills, fatigue and fever.  HENT:   Negative for lump/mass, mouth sores, nosebleeds, sore throat and trouble swallowing.   Eyes:  Negative for eye problems.  Respiratory:  Negative for cough and shortness of breath.   Cardiovascular:  Negative for chest pain, leg swelling and palpitations.  Gastrointestinal:  Negative for abdominal pain, constipation, diarrhea, nausea and vomiting.  Genitourinary:  Negative for bladder incontinence, difficulty urinating, dysuria, frequency, hematuria and nocturia.   Musculoskeletal:  Negative for arthralgias, back pain, flank pain, myalgias and neck pain.  Skin:  Negative for itching and rash.  Neurological:  Negative for dizziness, headaches and numbness.  Hematological:  Does not bruise/bleed easily.  Psychiatric/Behavioral:   Negative  for depression, sleep disturbance and suicidal ideas. The patient is not nervous/anxious.   All other systems reviewed and are negative.    VITALS:   Blood pressure 130/85, pulse 80, temperature (!) 97.4 F (36.3 C), temperature source Oral, resp. rate 16, weight 237 lb 10.5 oz (107.8 kg), SpO2 99%.  Wt Readings from Last 3 Encounters:  06/14/24 237 lb 10.5 oz (107.8 kg)  05/17/24 233 lb 4 oz (105.8 kg)  05/10/24 235 lb (106.6 kg)    Body mass index is 32.23 kg/m.  Performance status (ECOG): 1 - Symptomatic but completely ambulatory  PHYSICAL EXAM:   Physical Exam Vitals and nursing note reviewed. Exam conducted with a chaperone present.  Constitutional:      Appearance: Normal appearance.   Cardiovascular:     Rate and Rhythm: Normal rate and regular rhythm.     Pulses: Normal pulses.     Heart sounds: Normal heart sounds.  Pulmonary:     Effort: Pulmonary effort is normal.     Breath sounds: Normal breath sounds.  Abdominal:     Palpations: Abdomen is soft. There is no hepatomegaly, splenomegaly or mass.     Tenderness: There is no abdominal tenderness.   Musculoskeletal:     Right lower leg: No edema.     Left lower leg: No edema.  Lymphadenopathy:     Cervical: No cervical adenopathy.     Right cervical: No superficial, deep or posterior cervical adenopathy.    Left cervical: No superficial, deep or posterior cervical adenopathy.     Upper Body:     Right upper body: No supraclavicular or axillary adenopathy.     Left upper body: No supraclavicular or axillary adenopathy.   Neurological:     General: No focal deficit present.     Mental Status: He is alert and oriented to person, place, and time.   Psychiatric:        Mood and Affect: Mood normal.        Behavior: Behavior normal.     LABS:   CBC     Component Value Date/Time   WBC 7.6 06/06/2024 1338   RBC 4.59 06/06/2024 1338   HGB 15.2 06/06/2024 1338   HGB 15.0 02/21/2024 1445   HCT  46.7 06/06/2024 1338   HCT 44.6 02/21/2024 1445   PLT 306 06/06/2024 1338   PLT 337 02/21/2024 1445   MCV 101.7 (H) 06/06/2024 1338   MCV 98 (H) 02/21/2024 1445   MCH 33.1 06/06/2024 1338   MCHC 32.5 06/06/2024 1338   RDW 12.8 06/06/2024 1338   RDW 11.7 02/21/2024 1445   LYMPHSABS 3.1 06/06/2024 1338   LYMPHSABS 2.8 02/21/2024 1445   MONOABS 0.8 06/06/2024 1338   EOSABS 0.2 06/06/2024 1338   EOSABS 0.2 02/21/2024 1445   BASOSABS 0.1 06/06/2024 1338   BASOSABS 0.1 02/21/2024 1445    CMP      Component Value Date/Time   NA 138 06/06/2024 1338   NA 140 02/21/2024 1445   K 3.5 06/06/2024 1338   CL 103 06/06/2024 1338   CO2 26 06/06/2024 1338   GLUCOSE 101 (H) 06/06/2024 1338   BUN 21 06/06/2024 1338   BUN 18 02/21/2024 1445   CREATININE 1.51 (H) 06/06/2024 1338   CREATININE 1.50 (H) 08/16/2018 1514   CALCIUM  9.5 06/06/2024 1338   PROT 7.5 06/06/2024 1338   PROT 7.3 02/21/2024 1445   ALBUMIN 3.9 06/06/2024 1338   ALBUMIN 4.1 02/21/2024 1445   AST 18 06/06/2024  1338   ALT 18 06/06/2024 1338   ALKPHOS 64 06/06/2024 1338   BILITOT 0.7 06/06/2024 1338   BILITOT 0.4 02/21/2024 1445   GFRNONAA 51 (L) 06/06/2024 1338   GFRNONAA 50 (L) 08/16/2018 1514   GFRAA 54 (L) 12/04/2019 1418   GFRAA 57 (L) 08/16/2018 1514     No results found for: CEA1, CEA / No results found for: CEA1, CEA Lab Results  Component Value Date   PSA1 3.5 02/21/2024   No results found for: CAN199 No results found for: RJW874  Lab Results  Component Value Date   TOTALPROTELP 7.3 06/06/2024   ALBUMINELP 3.7 06/06/2024   A1GS 0.2 06/06/2024   A2GS 0.7 06/06/2024   BETS 1.0 06/06/2024   GAMS 1.6 06/06/2024   MSPIKE Not Observed 06/06/2024   SPEI Comment 06/06/2024   Lab Results  Component Value Date   FERRITIN 983 (H) 11/21/2020   Lab Results  Component Value Date   LDH 124 06/06/2024   LDH 115 12/08/2023   LDH 121 02/14/2023     STUDIES:   No results found.

## 2024-06-14 ENCOUNTER — Inpatient Hospital Stay (HOSPITAL_BASED_OUTPATIENT_CLINIC_OR_DEPARTMENT_OTHER): Payer: Medicare PPO | Admitting: Hematology

## 2024-06-14 VITALS — BP 130/85 | HR 80 | Temp 97.4°F | Resp 16 | Wt 237.7 lb

## 2024-06-14 DIAGNOSIS — F17210 Nicotine dependence, cigarettes, uncomplicated: Secondary | ICD-10-CM | POA: Diagnosis not present

## 2024-06-14 DIAGNOSIS — D3501 Benign neoplasm of right adrenal gland: Secondary | ICD-10-CM | POA: Diagnosis not present

## 2024-06-14 DIAGNOSIS — D472 Monoclonal gammopathy: Secondary | ICD-10-CM

## 2024-06-14 DIAGNOSIS — R911 Solitary pulmonary nodule: Secondary | ICD-10-CM | POA: Diagnosis not present

## 2024-06-14 DIAGNOSIS — Z79899 Other long term (current) drug therapy: Secondary | ICD-10-CM | POA: Diagnosis not present

## 2024-06-14 DIAGNOSIS — Z85238 Personal history of other malignant neoplasm of thymus: Secondary | ICD-10-CM | POA: Diagnosis not present

## 2024-06-14 NOTE — Patient Instructions (Addendum)

## 2024-06-21 ENCOUNTER — Ambulatory Visit (INDEPENDENT_AMBULATORY_CARE_PROVIDER_SITE_OTHER): Admitting: Podiatry

## 2024-06-21 ENCOUNTER — Encounter: Payer: Self-pay | Admitting: Podiatry

## 2024-06-21 DIAGNOSIS — B351 Tinea unguium: Secondary | ICD-10-CM | POA: Diagnosis not present

## 2024-06-21 DIAGNOSIS — M79674 Pain in right toe(s): Secondary | ICD-10-CM

## 2024-06-21 DIAGNOSIS — M79675 Pain in left toe(s): Secondary | ICD-10-CM

## 2024-06-21 NOTE — Progress Notes (Signed)
  Subjective:  Patient ID: Derrick Gaines, male    DOB: [birth year],  MRN: [id number]  Chief Complaint  Patient presents with   Nail Problem    RFC    68 y.o. male presents with the above complaint. History confirmed with patient. Patient presenting with pain related to dystrophic thickened elongated nails. Patient is unable to trim own nails related to nail dystrophy and/or mobility issues. Patient does not have a history of T2DM.  No reported calluses.  Had been using cream for tinea pedis, appears improved.  Objective:  Physical Exam: warm to cool, good capillary refill nail exam onychomycosis of the toenails, onycholysis, dystrophic nails DP pulses palpable, PT pulses palpable, and protective sensation intact Left Foot:  Pain with palpation of nails due to elongation and dystrophic growth.  Maceration to the webspaces improved Right Foot: Pain with palpation of nails due to elongation and dystrophic growth.  Maceration to the webspaces improved  Assessment:   1. Pain due to onychomycosis of toenails of both feet      Plan:  Patient was evaluated and treated and all questions answered.  #Onychomycosis with pain  -Nails palliatively debrided as below. -Educated on self-care  Procedure: Nail Debridement Rationale: Pain Type of Debridement: manual, sharp debridement. Instrumentation: Nail nipper, rotary burr. Number of Nails: 10  Return in about 3 months (around 09/21/2024) for Routine Foot Care.         Ethan Saddler, DPM [facility] / [facility]

## 2024-06-26 ENCOUNTER — Ambulatory Visit (INDEPENDENT_AMBULATORY_CARE_PROVIDER_SITE_OTHER): Payer: Medicare PPO | Admitting: Internal Medicine

## 2024-06-26 ENCOUNTER — Encounter: Payer: Self-pay | Admitting: Internal Medicine

## 2024-06-26 VITALS — BP 138/82 | HR 80 | Ht 72.0 in | Wt 240.6 lb

## 2024-06-26 DIAGNOSIS — Z0001 Encounter for general adult medical examination with abnormal findings: Secondary | ICD-10-CM

## 2024-06-26 DIAGNOSIS — R739 Hyperglycemia, unspecified: Secondary | ICD-10-CM

## 2024-06-26 DIAGNOSIS — F01C18 Vascular dementia, severe, with other behavioral disturbance: Secondary | ICD-10-CM | POA: Diagnosis not present

## 2024-06-26 DIAGNOSIS — E559 Vitamin D deficiency, unspecified: Secondary | ICD-10-CM

## 2024-06-26 DIAGNOSIS — E782 Mixed hyperlipidemia: Secondary | ICD-10-CM | POA: Diagnosis not present

## 2024-06-26 DIAGNOSIS — I10 Essential (primary) hypertension: Secondary | ICD-10-CM | POA: Diagnosis not present

## 2024-06-26 DIAGNOSIS — E538 Deficiency of other specified B group vitamins: Secondary | ICD-10-CM | POA: Diagnosis not present

## 2024-06-26 DIAGNOSIS — F3341 Major depressive disorder, recurrent, in partial remission: Secondary | ICD-10-CM

## 2024-06-26 DIAGNOSIS — N1831 Chronic kidney disease, stage 3a: Secondary | ICD-10-CM

## 2024-06-26 MED ORDER — LOSARTAN POTASSIUM 25 MG PO TABS: 25.0000 mg | ORAL_TABLET | Freq: Every day | ORAL | 3 refills | Status: AC

## 2024-06-26 MED ORDER — DULOXETINE HCL 30 MG PO CPEP: 30.0000 mg | ORAL_CAPSULE | Freq: Two times a day (BID) | ORAL | 5 refills | Status: DC

## 2024-06-26 MED ORDER — HYDROCHLOROTHIAZIDE 12.5 MG PO TABS: 12.5000 mg | ORAL_TABLET | Freq: Every day | ORAL | 3 refills | Status: AC

## 2024-06-26 MED ORDER — AMLODIPINE BESYLATE 10 MG PO TABS: 10.0000 mg | ORAL_TABLET | Freq: Every day | ORAL | 3 refills | Status: AC

## 2024-06-26 MED ORDER — ATORVASTATIN CALCIUM 40 MG PO TABS: 40.0000 mg | ORAL_TABLET | Freq: Every day | ORAL | 3 refills | Status: AC

## 2024-06-26 NOTE — Assessment & Plan Note (Signed)
 Well controlled with Cymbalta  30 mg BID Needs to avoid substance use including alcohol , tobacco and marijuana

## 2024-06-26 NOTE — Assessment & Plan Note (Signed)
 BP Readings from Last 1 Encounters:  06/26/24 138/82   Usually Well-controlled with Losartan  25 mg once daily, Amlodipine  10 mg QD and hydrochlorothiazide  12.5 mg once daily Counseled for compliance with the medications Advised DASH diet and moderate exercise/walking, at least 150 mins/week

## 2024-06-26 NOTE — Assessment & Plan Note (Addendum)
 GFR stays around 50-55, last BMP showed GFR of 51 Used to be followed by Dr. Rachele [facility] Maintain adequate hydration

## 2024-06-26 NOTE — Progress Notes (Signed)
 New Patient Office Visit  Subjective:  Patient ID: Derrick Gaines, male    DOB: Mar 10, 1956  Age: 68 y.o. MRN: [id number]  CC:  Chief Complaint  Patient presents with   Annual Exam    CPE , 4 Month f/u would like to discuss about home health services.    HPI Derrick Gaines is a 68 y.o. male with past medical history of HTN, CVA, CKD, HLD, thymoma s/p resection and tobacco abuse who presents for annual physical.  HTN: His BP is WNL.  He currently takes amlodipine  10 mg QD, losartan  25 mg QD and hydrochlorothiazide  12.5 mg once daily. He had hospitalization due to elevated troponin in 05/24, which was deemed to be due to demand ischemia in the setting of hypertensive urgency.  He currently denies any headache, dizziness, chest pain, dyspnea or palpitations.  History of CVA: His wife reports history of CVA, with residual mild right UE weakness.  Patient himself denies any numbness or weakness of the UE or LE.  He has memory deficits for the last few years, which has been progressing.  He has been forgetting streets and his wife has stopped him from driving due to irrational driving.  Chart review suggest that he takes alcohol  with his friends and smokes weed at times.  Denies any other illicit drug use.  His wife also reports that his gait has been disturbed, but denies urinary or stool incontinence.  He had MRI of brain, which showed chronically advanced small vessel disease with progressive encephalomalacia.  He also had neurology evaluation and was told of vascular dementia. He has been placed on Namenda  now. He has started taking vitamin B12 supplement now.  CKD: Used to follow up with Dr. Rachele.  He had elevated immunoglobulin light chains and has seen oncology for it.  Denies any dysuria, hematuria or urinary hesitancy or resistance.  History of thymoma s/p resection: He refused chemoradiation.  He is under annual CT chest surveillance.  He smokes 1 pack/day.  Denies any dyspnea or  wheezing currently. He is trying to cut down.  Of note, he has started taking Cymbalta  30 mg BID as he was having worsening of depression/hypersomnolence when he stopped taking it.  His wife reports remote history of MDD.  He denies anhedonia, anxiety, SI or HI currently.   Past Medical History:  Diagnosis Date   Arthritis    Cancer (HCC)    Chronic kidney disease    Dementia (HCC)    Hypertension    Stroke (HCC)    mild left sided weakness   Vocal cord polyp     Past Surgical History:  Procedure Laterality Date   BACK SURGERY     BRONCHIAL BRUSHINGS  05/10/2024   Procedure: BRONCHOSCOPY, WITH BRUSH BIOPSY;  Surgeon: Shyrl Linnie KIDD, MD;  Location: [facility];  Service: Thoracic;;   BRONCHIAL NEEDLE ASPIRATION BIOPSY  05/10/2024   Procedure: BRONCHOSCOPY, WITH NEEDLE ASPIRATION BIOPSY;  Surgeon: Shyrl Linnie KIDD, MD;  Location: [facility];  Service: Thoracic;;   MICROLARYNGOSCOPY N/A 08/29/2018   Procedure: DIRECT MICROLARYNGOSCOPY WITH EXCISION OF LARYNGEAL MASS;  Surgeon: Karis Clunes, MD;  Location: [facility];  Service: ENT;  Laterality: N/A;   STERNOTOMY     THYMECTOMY     VIDEO BRONCHOSCOPY WITH ENDOBRONCHIAL NAVIGATION N/A 05/10/2024   Procedure: VIDEO BRONCHOSCOPY WITH ENDOBRONCHIAL NAVIGATION;  Surgeon: Shyrl Linnie KIDD, MD;  Location: [facility];  Service: Thoracic;  Laterality: N/A;    Family History  Problem  Relation Age of Onset   Diabetes Mother    Cancer Mother    Colon cancer Sister    Pneumonia Father    Emphysema Brother     Social History   Socioeconomic History   Marital status: Married    Spouse name: Not on file   Number of children: Not on file   Years of education: Not on file   Highest education level: Not on file  Occupational History   Not on file  Tobacco Use   Smoking status: Every Day    Current packs/day: 0.50    Average packs/day: 0.5 packs/day for 30.0 years (15.0 ttl pk-yrs)    Types: Cigarettes    Smokeless tobacco: Never   Tobacco comments:    1 ppd as of 02/24/24  Vaping Use   Vaping status: Never Used  Substance and Sexual Activity   Alcohol  use: No   Drug use: No   Sexual activity: Yes  Other Topics Concern   Not on file  Social History Narrative   Been on disability since age 72 due to a stroke.    Used to work at [employer].    Married. No children. Married for 15 years.   Smoke cigarettes, since age 8 years.    No alcohol . No drugs.   Enjoys fishing.    Attends church.   Eats all foods.   Wear seatbelt.    Drives.    Right handed   One story home   Lives with wife , and son and wifes two sisters   Social Drivers of Corporate investment banker Strain: Not on file  Food Insecurity: No Food Insecurity (04/27/2023)   Hunger Vital Sign    Worried About Running Out of Food in the Last Year: Never true    Ran Out of Food in the Last Year: Never true  Transportation Needs: No Transportation Needs (04/27/2023)   PRAPARE - Administrator, Civil Service (Medical): No    Lack of Transportation (Non-Medical): No  Physical Activity: Not on file  Stress: Not on file  Social Connections: Not on file  Intimate Partner Violence: Not At Risk (04/27/2023)   Humiliation, Afraid, Rape, and Kick questionnaire    Fear of Current or Ex-Partner: No    Emotionally Abused: No    Physically Abused: No    Sexually Abused: No    ROS Review of Systems  Constitutional:  Negative for chills and fever.  HENT:  Negative for congestion and sore throat.   Eyes:  Negative for pain and discharge.  Respiratory:  Negative for cough and shortness of breath.   Cardiovascular:  Negative for chest pain and palpitations.  Gastrointestinal:  Negative for diarrhea, nausea and vomiting.  Endocrine: Negative for polydipsia and polyuria.  Genitourinary:  Negative for dysuria and hematuria.  Musculoskeletal:  Negative for neck pain and neck stiffness.  Skin:  Negative for rash.  Neurological:   Negative for dizziness, weakness, numbness and headaches.  Psychiatric/Behavioral:  Positive for confusion. Negative for agitation and behavioral problems.     Objective:   Today's Vitals: BP 138/82 (BP Location: Left Arm)   Pulse 80   Ht 6' (1.829 m)   Wt 240 lb 9.6 oz (109.1 kg)   SpO2 94%   BMI 32.63 kg/m   Physical Exam Vitals reviewed.  Constitutional:      General: He is not in acute distress.    Appearance: He is not diaphoretic.  HENT:  Head: Normocephalic and atraumatic.     Nose: Nose normal.     Mouth/Throat:     Mouth: Mucous membranes are moist.   Eyes:     General: No scleral icterus.    Extraocular Movements: Extraocular movements intact.    Cardiovascular:     Rate and Rhythm: Normal rate and regular rhythm.     Heart sounds: Normal heart sounds. No murmur heard. Pulmonary:     Breath sounds: Normal breath sounds. No wheezing or rales.  Abdominal:     Palpations: Abdomen is soft.     Tenderness: There is no abdominal tenderness.   Musculoskeletal:     Cervical back: Neck supple. No tenderness.     Right lower leg: No edema.     Left lower leg: No edema.   Skin:    General: Skin is warm.     Findings: No rash.   Neurological:     General: No focal deficit present.     Mental Status: He is alert and oriented to person, place, and time.     Sensory: No sensory deficit.     Motor: No weakness.     Gait: Gait abnormal.   Psychiatric:        Mood and Affect: Mood normal.        Behavior: Behavior is cooperative.     Assessment & Plan:   Problem List Items Addressed This Visit       Cardiovascular and Mediastinum   Essential hypertension   BP Readings from Last 1 Encounters:  06/26/24 138/82   Usually Well-controlled with Losartan  25 mg once daily, Amlodipine  10 mg QD and hydrochlorothiazide  12.5 mg once daily Counseled for compliance with the medications Advised DASH diet and moderate exercise/walking, at least 150 mins/week       Relevant Medications   amLODipine  (NORVASC ) 10 MG tablet   atorvastatin  (LIPITOR) 40 MG tablet   hydrochlorothiazide  (HYDRODIURIL ) 12.5 MG tablet   losartan  (COZAAR ) 25 MG tablet     Nervous and Auditory   Severe vascular dementia (HCC)   MRI brain reviewed MoCA: 9/30 during neurology evaluation On Namenda  10 mg BID now Needs to be compliant to HTN medicines Had low B12 and B1 levels - on supplements now, needs to continue thiamine  supplement      Relevant Medications   DULoxetine  (CYMBALTA ) 30 MG capsule   Other Relevant Orders   TSH + free T4     Genitourinary   CKD (chronic kidney disease) stage 3, GFR 30-59 ml/min (HCC)   GFR stays around 50-55, last BMP showed GFR of 51 Used to be followed by Dr. Rachele On [facility] Maintain adequate hydration      Relevant Orders   TSH + free T4     Other   Mixed hyperlipidemia   On Lipitor 40 mg once daily Check lipid profile      Relevant Medications   amLODipine  (NORVASC ) 10 MG tablet   atorvastatin  (LIPITOR) 40 MG tablet   hydrochlorothiazide  (HYDRODIURIL ) 12.5 MG tablet   losartan  (COZAAR ) 25 MG tablet   Other Relevant Orders   Lipid Profile   B12 deficiency   Lab Results  Component Value Date   VITAMINB12 719 02/21/2024   Improved now On oral Vit B12 1000 mcg once daily      MDD (major depressive disorder), recurrent, in partial remission (HCC)   Well controlled with Cymbalta  30 mg BID Needs to avoid substance use including alcohol , tobacco and marijuana  Relevant Medications   DULoxetine  (CYMBALTA ) 30 MG capsule   Other Visit Diagnoses       Encounter for general adult medical examination with abnormal findings    -  Primary     Vitamin D  deficiency       Relevant Orders   Vitamin D  (25 hydroxy)     Hyperglycemia       Relevant Orders   Hemoglobin A1c         Outpatient Encounter Medications as of 06/26/2024  Medication Sig   aspirin  EC 81 MG tablet Take 81 mg by mouth daily. Swallow whole.    Cyanocobalamin  (B-12) 1000 MCG TABS Take by mouth.   memantine  (NAMENDA ) 10 MG tablet Take 1 tablet (10 mg at night) for 2 weeks, then increase to 1 tablet (10 mg) twice a day   Riboflavin (B-2) 100 MG TABS Take by mouth.   thiamine  (VITAMIN B1) 100 MG tablet Take 1 tablet (100 mg total) by mouth daily.   [DISCONTINUED] amLODipine  (NORVASC ) 10 MG tablet Take 1 tablet (10 mg total) by mouth daily.   [DISCONTINUED] atorvastatin  (LIPITOR) 40 MG tablet Take 1 tablet (40 mg total) by mouth daily.   [DISCONTINUED] DULoxetine  (CYMBALTA ) 30 MG capsule Take 1 capsule (30 mg total) by mouth daily.   [DISCONTINUED] hydrochlorothiazide  (HYDRODIURIL ) 12.5 MG tablet TAKE 1 TABLET BY MOUTH EVERY DAY   [DISCONTINUED] losartan  (COZAAR ) 25 MG tablet TAKE 1 TABLET (25 MG TOTAL) BY MOUTH DAILY.   amLODipine  (NORVASC ) 10 MG tablet Take 1 tablet (10 mg total) by mouth daily.   atorvastatin  (LIPITOR) 40 MG tablet Take 1 tablet (40 mg total) by mouth daily.   DULoxetine  (CYMBALTA ) 30 MG capsule Take 1 capsule (30 mg total) by mouth 2 (two) times daily.   hydrochlorothiazide  (HYDRODIURIL ) 12.5 MG tablet Take 1 tablet (12.5 mg total) by mouth daily.   losartan  (COZAAR ) 25 MG tablet Take 1 tablet (25 mg total) by mouth daily.   No facility-administered encounter medications on file as of 06/26/2024.    Follow-up: Return in about 6 months (around 12/27/2024) for HTN and CVA.   Suzzane MARLA Blanch, MD

## 2024-06-26 NOTE — Patient Instructions (Addendum)
 Schedule your Medicare Annual Wellness Visit at checkout.  Please start taking Amlodipine  in addition to Losartan  and HCTZ.  Please continue to take medications as prescribed.  Please continue to follow low salt diet and ambulate as tolerated.  Please consider getting Shingrix vaccine at local pharmacy.

## 2024-06-26 NOTE — Assessment & Plan Note (Signed)
 Lab Results  Component Value Date   VITAMINB12 719 02/21/2024   Improved now On oral Vit B12 1000 mcg once daily

## 2024-06-26 NOTE — Assessment & Plan Note (Addendum)
 On Lipitor 40 mg once daily Check lipid profile

## 2024-06-26 NOTE — Assessment & Plan Note (Signed)
 MRI brain reviewed MoCA: 9/30 during neurology evaluation On Namenda  10 mg BID now Needs to be compliant to HTN medicines Had low B12 and B1 levels - on supplements now, needs to continue thiamine  supplement

## 2024-06-27 ENCOUNTER — Ambulatory Visit: Payer: Self-pay | Admitting: Internal Medicine

## 2024-06-27 LAB — TSH+FREE T4
Free T4: 0.86 ng/dL (ref 0.82–1.77)
TSH: 1.66 u[IU]/mL (ref 0.450–4.500)

## 2024-06-27 LAB — HEMOGLOBIN A1C
Est. average glucose Bld gHb Est-mCnc: 117 mg/dL
Hgb A1c MFr Bld: 5.7 % — ABNORMAL HIGH (ref 4.8–5.6)

## 2024-06-27 LAB — LIPID PANEL
Chol/HDL Ratio: 2.5 ratio (ref 0.0–5.0)
Cholesterol, Total: 98 mg/dL — ABNORMAL LOW (ref 100–199)
HDL: 39 mg/dL — ABNORMAL LOW (ref >39)
LDL Chol Calc (NIH): 44 mg/dL (ref 0–99)
Triglycerides: 72 mg/dL (ref 0–149)
VLDL Cholesterol Cal: 15 mg/dL (ref 5–40)

## 2024-06-27 LAB — VITAMIN D 25 HYDROXY (VIT D DEFICIENCY, FRACTURES): Vit D, 25-Hydroxy: 51.3 ng/mL (ref 30.0–100.0)

## 2024-08-06 ENCOUNTER — Ambulatory Visit (INDEPENDENT_AMBULATORY_CARE_PROVIDER_SITE_OTHER): Admitting: Internal Medicine

## 2024-08-06 ENCOUNTER — Encounter: Payer: Self-pay | Admitting: Internal Medicine

## 2024-08-06 VITALS — BP 144/76 | HR 93 | Ht 72.0 in | Wt 246.6 lb

## 2024-08-06 DIAGNOSIS — R222 Localized swelling, mass and lump, trunk: Secondary | ICD-10-CM

## 2024-08-06 NOTE — Patient Instructions (Signed)
 Please get US  of chest done at  [facility].

## 2024-08-06 NOTE — Progress Notes (Signed)
 Acute Office Visit  Subjective:    Patient ID: Derrick Gaines, male    DOB: [birth year], 68 y.o.   MRN: [id number]  Chief Complaint  Patient presents with   Skin Problem    Pt reports a knot on his chest ongoing for 1 month.     HPI Patient is in today for evaluation of a mass over right upper chest wall area for the last 1 month.  He denies any local pain, erythema or rash.  He denies any recent change in size or shape of the mass.  Of note, he has a history of thymoma, s/p resection.  He gets annual CT chest surveillance.  His last CT chest in 05/25 did not notice any mass in the right upper chest wall area.  Past Medical History:  Diagnosis Date   Arthritis    Cancer (HCC)    Chronic kidney disease    Dementia (HCC)    Hypertension    Stroke (HCC)    mild left sided weakness   Vocal cord polyp     Past Surgical History:  Procedure Laterality Date   BACK SURGERY     BRONCHIAL BRUSHINGS  05/10/2024   Procedure: BRONCHOSCOPY, WITH BRUSH BIOPSY;  Surgeon: Shyrl Linnie KIDD, MD;  Location: [facility];  Service: Thoracic;;   BRONCHIAL NEEDLE ASPIRATION BIOPSY  05/10/2024   Procedure: BRONCHOSCOPY, WITH NEEDLE ASPIRATION BIOPSY;  Surgeon: Shyrl Linnie KIDD, MD;  Location: [facility];  Service: Thoracic;;   MICROLARYNGOSCOPY N/A 08/29/2018   Procedure: DIRECT MICROLARYNGOSCOPY WITH EXCISION OF LARYNGEAL MASS;  Surgeon: Karis Clunes, MD;  Location: [facility];  Service: ENT;  Laterality: N/A;   STERNOTOMY     THYMECTOMY     VIDEO BRONCHOSCOPY WITH ENDOBRONCHIAL NAVIGATION N/A 05/10/2024   Procedure: VIDEO BRONCHOSCOPY WITH ENDOBRONCHIAL NAVIGATION;  Surgeon: Shyrl Linnie KIDD, MD;  Location: [facility];  Service: Thoracic;  Laterality: N/A;    Family History  Problem Relation Age of Onset   Diabetes Mother    Cancer Mother    Colon cancer Sister    Pneumonia Father    Emphysema Brother     Social History   Socioeconomic History   Marital status:  Married    Spouse name: Not on file   Number of children: Not on file   Years of education: Not on file   Highest education level: Not on file  Occupational History   Not on file  Tobacco Use   Smoking status: Every Day    Current packs/day: 0.50    Average packs/day: 0.5 packs/day for 30.0 years (15.0 ttl pk-yrs)    Types: Cigarettes   Smokeless tobacco: Never   Tobacco comments:    1 ppd as of 02/24/24  Vaping Use   Vaping status: Never Used  Substance and Sexual Activity   Alcohol  use: No   Drug use: No   Sexual activity: Yes  Other Topics Concern   Not on file  Social History Narrative   Been on disability since age 34 due to a stroke.    Used to work at [employer].    Married. No children. Married for 15 years.   Smoke cigarettes, since age 48 years.    No alcohol . No drugs.   Enjoys fishing.    Attends church.   Eats all foods.   Wear seatbelt.    Drives.    Right handed   One story home   Lives with wife , and son and wifes  two sisters   Social Drivers of Corporate investment banker Strain: Not on file  Food Insecurity: No Food Insecurity (04/27/2023)   Hunger Vital Sign    Worried About Running Out of Food in the Last Year: Never true    Ran Out of Food in the Last Year: Never true  Transportation Needs: No Transportation Needs (04/27/2023)   PRAPARE - Administrator, Civil Service (Medical): No    Lack of Transportation (Non-Medical): No  Physical Activity: Not on file  Stress: Not on file  Social Connections: Not on file  Intimate Partner Violence: Not At Risk (04/27/2023)   Humiliation, Afraid, Rape, and Kick questionnaire    Fear of Current or Ex-Partner: No    Emotionally Abused: No    Physically Abused: No    Sexually Abused: No    Outpatient Medications Prior to Visit  Medication Sig Dispense Refill   amLODipine  (NORVASC ) 10 MG tablet Take 1 tablet (10 mg total) by mouth daily. 90 tablet 3   aspirin  EC 81 MG tablet Take 81 mg by mouth  daily. Swallow whole.     atorvastatin  (LIPITOR) 40 MG tablet Take 1 tablet (40 mg total) by mouth daily. 90 tablet 3   Cyanocobalamin  (B-12) 1000 MCG TABS Take by mouth.     DULoxetine  (CYMBALTA ) 30 MG capsule Take 1 capsule (30 mg total) by mouth 2 (two) times daily. 60 capsule 5   hydrochlorothiazide  (HYDRODIURIL ) 12.5 MG tablet Take 1 tablet (12.5 mg total) by mouth daily. 90 tablet 3   losartan  (COZAAR ) 25 MG tablet Take 1 tablet (25 mg total) by mouth daily. 390 tablet 3   memantine  (NAMENDA ) 10 MG tablet Take 1 tablet (10 mg at night) for 2 weeks, then increase to 1 tablet (10 mg) twice a day 60 tablet 11   Riboflavin (B-2) 100 MG TABS Take by mouth.     thiamine  (VITAMIN B1) 100 MG tablet Take 1 tablet (100 mg total) by mouth daily. 90 tablet 3   No facility-administered medications prior to visit.    No Known Allergies  Review of Systems  Constitutional:  Negative for chills and fever.  HENT:  Negative for congestion and sore throat.   Eyes:  Negative for pain and discharge.  Respiratory:  Negative for cough and shortness of breath.   Cardiovascular:  Negative for chest pain and palpitations.  Gastrointestinal:  Negative for diarrhea, nausea and vomiting.  Endocrine: Negative for polydipsia and polyuria.  Genitourinary:  Negative for dysuria and hematuria.  Musculoskeletal:  Negative for neck pain and neck stiffness.  Skin:  Negative for rash.  Neurological:  Negative for dizziness, weakness, numbness and headaches.  Psychiatric/Behavioral:  Positive for confusion. Negative for agitation and behavioral problems.        Objective:    Physical Exam Vitals reviewed.  Constitutional:      General: He is not in acute distress.    Appearance: He is not diaphoretic.  HENT:     Head: Normocephalic and atraumatic.     Nose: Nose normal.     Mouth/Throat:     Mouth: Mucous membranes are moist.  Eyes:     General: No scleral icterus.    Extraocular Movements: Extraocular  movements intact.  Cardiovascular:     Rate and Rhythm: Normal rate and regular rhythm.     Heart sounds: Normal heart sounds. No murmur heard. Pulmonary:     Breath sounds: Normal breath sounds. No wheezing or rales.  Chest:     Comments: About 3 cm in diameter soft tissue mass near medial border of right sided clavicle, nontender Musculoskeletal:     Cervical back: Neck supple. No tenderness.     Right lower leg: No edema.     Left lower leg: No edema.  Skin:    General: Skin is warm.     Findings: No rash.  Neurological:     General: No focal deficit present.     Mental Status: He is alert and oriented to person, place, and time.     Sensory: No sensory deficit.     Motor: No weakness.     Gait: Gait abnormal.  Psychiatric:        Mood and Affect: Mood normal.        Behavior: Behavior is cooperative.     BP 138/74 (BP Location: Left Arm)   Pulse 93   Ht 6' (1.829 m)   Wt 246 lb 9.6 oz (111.9 kg)   SpO2 94%   BMI 33.44 kg/m  Wt Readings from Last 3 Encounters:  08/06/24 246 lb 9.6 oz (111.9 kg)  06/26/24 240 lb 9.6 oz (109.1 kg)  06/14/24 237 lb 10.5 oz (107.8 kg)        Assessment & Plan:   Problem List Items Addressed This Visit       Other   Mass of chest wall, right - Primary   Soft tissue mass could be lipoma versus cyst Has history of thymoma Will obtain US  of chest soft tissue - will decide further management based on finding      Relevant Orders   US  CHEST SOFT TISSUE     No orders of the defined types were placed in this encounter.    Suzzane MARLA Blanch, MD

## 2024-08-08 DIAGNOSIS — R222 Localized swelling, mass and lump, trunk: Secondary | ICD-10-CM | POA: Insufficient documentation

## 2024-08-09 ENCOUNTER — Ambulatory Visit (HOSPITAL_COMMUNITY)

## 2024-08-09 ENCOUNTER — Ambulatory Visit (HOSPITAL_COMMUNITY)
Admission: RE | Admit: 2024-08-09 | Discharge: 2024-08-09 | Disposition: A | Discharge status: Home / Self Care | Source: Ambulatory Visit | Attending: Internal Medicine | Admitting: Internal Medicine

## 2024-08-09 DIAGNOSIS — R222 Localized swelling, mass and lump, trunk: Secondary | ICD-10-CM | POA: Diagnosis not present

## 2024-08-09 DIAGNOSIS — M799 Soft tissue disorder, unspecified: Secondary | ICD-10-CM | POA: Diagnosis not present

## 2024-08-09 NOTE — Assessment & Plan Note (Signed)
 Soft tissue mass could be lipoma versus cyst Has history of thymoma Will obtain US  of chest soft tissue - will decide further management based on finding

## 2024-08-21 ENCOUNTER — Encounter: Payer: Self-pay | Admitting: Student

## 2024-08-21 ENCOUNTER — Ambulatory Visit: Attending: Student | Admitting: Student

## 2024-08-21 VITALS — BP 132/68 | HR 83 | Ht 72.0 in | Wt 245.2 lb

## 2024-08-21 DIAGNOSIS — N183 Chronic kidney disease, stage 3 unspecified: Secondary | ICD-10-CM

## 2024-08-21 DIAGNOSIS — E785 Hyperlipidemia, unspecified: Secondary | ICD-10-CM

## 2024-08-21 DIAGNOSIS — I10 Essential (primary) hypertension: Secondary | ICD-10-CM | POA: Diagnosis not present

## 2024-08-21 DIAGNOSIS — R7989 Other specified abnormal findings of blood chemistry: Secondary | ICD-10-CM

## 2024-08-21 NOTE — Progress Notes (Signed)
 Cardiology Office Note    Date:  08/21/2024  ID:  Derrick Gaines, DOB 07-15-1956, MRN [id number] Cardiologist: Alvan Carrier, MD Cardiology APP:  Johnson Laymon HERO, PA-C { :  History of Present Illness:    Derrick Gaines is a 68 y.o. male with past medical history of HTN, HLD, Stage 3 CKD, dementia, prior CVA and history of thymic cancer (s/p resection) who presents to the office today for 55-month follow-up.  He was last examined by Dr. Alvan in 12/2023 and had been admitted in 04/2023 for elevated troponins but denied any cardiopulmonary symptoms at that time. He denied any recent chest pain or dyspnea on exertion at the time of his office visit. Given absence of symptoms and his dementia, medical management was recommended. He was continued on Amlodipine  10 mg daily, ASA 81 mg daily, Atorvastatin  40 mg daily, HCTZ 12.5 mg daily and Losartan  25 mg daily.  In the interim, he was diagnosed with a left upper lobe pulmonary nodule which showed moderate metabolic activity by PET. He underwent bronchoscopy by Dr. Shyrl in 04/2024 which showed no malignant cells. Oncology recommended follow-up CT scan in 1 year for reassessment.  In talking with the patient and his wife today, he denies any recent chest pain or palpitations. He does have baseline dyspnea on exertion but no acute changes in this. They feel like this is due to his tobacco use as he continues to smoke approximately 1 pack/day. No recent orthopnea, PND or pitting edema. He does not like going to the grocery store but does push mow his backyard and denies any significant symptoms with this. His wife helps with all of his medications given his dementia.  Studies Reviewed:   EKG: EKG is not ordered today.  Echocardiogram: 04/2023 IMPRESSIONS     1. Left ventricular ejection fraction, by estimation, is 55 to 60%. The  left ventricle has normal function. The left ventricle has no regional  wall motion abnormalities. Left  ventricular diastolic parameters are  consistent with Grade I diastolic  dysfunction (impaired relaxation).   2. Right ventricular systolic function is normal. The right ventricular  size is normal. Tricuspid regurgitation signal is inadequate for assessing  PA pressure.   3. The mitral valve was not well visualized. No evidence of mitral valve  regurgitation. No evidence of mitral stenosis.   4. The aortic valve was not well visualized. Aortic valve regurgitation  is not visualized. No aortic stenosis is present.   5. Aortic dilatation noted. There is mild dilatation of the ascending  aorta, measuring 36 mm.   6. The inferior vena cava is normal in size with <50% respiratory  variability, suggesting right atrial pressure of 8 mmHg.    Physical Exam:   VS:  BP 132/68 (BP Location: Right Arm, Cuff Size: Normal)   Pulse 83   Ht 6' (1.829 m)   Wt 245 lb 3.2 oz (111.2 kg)   SpO2 96%   BMI 33.26 kg/m    Wt Readings from Last 3 Encounters:  08/21/24 245 lb 3.2 oz (111.2 kg)  08/06/24 246 lb 9.6 oz (111.9 kg)  06/26/24 240 lb 9.6 oz (109.1 kg)     GEN: Well nourished, well developed male appearing in no acute distress NECK: No JVD; No carotid bruits CARDIAC: RRR, no murmurs, rubs, gallops RESPIRATORY:  Clear to auscultation without rales, wheezing or rhonchi  ABDOMEN: Appears non-distended. No obvious abdominal masses. EXTREMITIES: No clubbing or cyanosis. No pitting edema.  Distal pedal  pulses are 2+ bilaterally.   Assessment and Plan:   1. Elevated troponin level - Was noted to have elevated Hs troponin values up to 800 during admission in 04/2023 and echocardiogram at that time showed a preserved EF of 55 to 60% with no regional wall motion abnormalities. The initial plan was to obtain a stress test as an outpatient but this was never pursued. At the time of his most recent office visit in 12/2023, was recommended to hold off on further testing at that time given no recent  symptoms and worsening dementia.  - He denies any recent chest pain and dyspnea has been unchanged. Continue with risk factor modification. Remains on ASA 81mg  daily and Atorvastatin  40mg  daily.   2. Essential hypertension, benign - BP is at 132/68 during today's visit. Continue Amlodipine  10 mg daily, HCTZ 12.5 mg daily and Losartan  25 mg daily.  3. Hyperlipidemia LDL goal <70 - FLP in 06/2024 showed total cholesterol 98, triglycerides 72, HDL 39 and LDL 44. Continue current medical therapy with Atorvastatin  40 mg daily.  4. Stage 3 chronic kidney disease, unspecified whether stage 3a or 3b CKD (HCC) - Creatinine was at 1.51 when checked in 05/2024 which is close to his known baseline of ~ 1.4.    Signed, Laymon CHRISTELLA Qua, PA-C

## 2024-08-21 NOTE — Patient Instructions (Signed)
 Medication Instructions:  Your physician recommends that you continue on your current medications as directed. Please refer to the Current Medication list given to you today.  *If you need a refill on your cardiac medications before your next appointment, please call your pharmacy*  Lab Work: NONE   If you have labs (blood work) drawn today and your tests are completely normal, you will receive your results only by: MyChart Message (if you have MyChart) OR A paper copy in the mail If you have any lab test that is abnormal or we need to change your treatment, we will call you to review the results.  Testing/Procedures: NONE   Follow-Up: At [facility], you and your health needs are our priority.  As part of our continuing mission to provide you with exceptional heart care, our providers are all part of one team.  This team includes your primary Cardiologist (physician) and Advanced Practice Providers or APPs (Physician Assistants and Nurse Practitioners) who all work together to provide you with the care you need, when you need it.  Your next appointment:   1 year(s)  Provider:   You may see Alvan Carrier, MD or one of the following Advanced Practice Providers on your designated Care Team:   Laymon Qua, PA-C  Scotesia [place], [place] Olivia Pavy, [place]     We recommend signing up for the patient portal called MyChart.  Sign up information is provided on this After Visit Summary.  MyChart is used to connect with patients for Virtual Visits (Telemedicine).  Patients are able to view lab/test results, encounter notes, upcoming appointments, etc.  Non-urgent messages can be sent to your provider as well.   To learn more about what you can do with MyChart, go to [email or web address].   Other Instructions Thank you for choosing [facility]!

## 2024-08-24 ENCOUNTER — Ambulatory Visit: Payer: Self-pay | Admitting: Internal Medicine

## 2024-09-20 ENCOUNTER — Ambulatory Visit: Admitting: Podiatry

## 2024-12-17 ENCOUNTER — Other Ambulatory Visit (HOSPITAL_COMMUNITY): Payer: Self-pay

## 2024-12-17 DIAGNOSIS — D472 Monoclonal gammopathy: Secondary | ICD-10-CM | POA: Insufficient documentation

## 2024-12-18 ENCOUNTER — Inpatient Hospital Stay: Attending: Oncology

## 2024-12-18 DIAGNOSIS — D472 Monoclonal gammopathy: Secondary | ICD-10-CM | POA: Diagnosis not present

## 2024-12-18 LAB — CBC WITH DIFFERENTIAL/PLATELET
Abs Immature Granulocytes: 0.04 K/uL (ref 0.00–0.07)
Basophils Absolute: 0.1 K/uL (ref 0.0–0.1)
Basophils Relative: 1 %
Eosinophils Absolute: 0.2 K/uL (ref 0.0–0.5)
Eosinophils Relative: 2 %
HCT: 46.7 % (ref 39.0–52.0)
Hemoglobin: 15.2 g/dL (ref 13.0–17.0)
Immature Granulocytes: 1 %
Lymphocytes Relative: 43 %
Lymphs Abs: 2.9 K/uL (ref 0.7–4.0)
MCH: 32.6 pg (ref 26.0–34.0)
MCHC: 32.5 g/dL (ref 30.0–36.0)
MCV: 100.2 fL — ABNORMAL HIGH (ref 80.0–100.0)
Monocytes Absolute: 0.7 K/uL (ref 0.1–1.0)
Monocytes Relative: 11 %
Neutro Abs: 2.9 K/uL (ref 1.7–7.7)
Neutrophils Relative %: 42 %
Platelets: 319 K/uL (ref 150–400)
RBC: 4.66 MIL/uL (ref 4.22–5.81)
RDW: 12.7 % (ref 11.5–15.5)
WBC: 6.8 K/uL (ref 4.0–10.5)
nRBC: 0 % (ref 0.0–0.2)

## 2024-12-18 LAB — COMPREHENSIVE METABOLIC PANEL WITH GFR
ALT: 22 U/L (ref 0–44)
AST: 24 U/L (ref 15–41)
Albumin: 4.3 g/dL (ref 3.5–5.0)
Alkaline Phosphatase: 94 U/L (ref 38–126)
Anion gap: 14 (ref 5–15)
BUN: 18 mg/dL (ref 8–23)
CO2: 25 mmol/L (ref 22–32)
Calcium: 10.4 mg/dL — ABNORMAL HIGH (ref 8.9–10.3)
Chloride: 101 mmol/L (ref 98–111)
Creatinine, Ser: 1.36 mg/dL — ABNORMAL HIGH (ref 0.61–1.24)
GFR, Estimated: 57 mL/min — ABNORMAL LOW
Glucose, Bld: 230 mg/dL — ABNORMAL HIGH (ref 70–99)
Potassium: 3.7 mmol/L (ref 3.5–5.1)
Sodium: 139 mmol/L (ref 135–145)
Total Bilirubin: 0.5 mg/dL (ref 0.0–1.2)
Total Protein: 8 g/dL (ref 6.5–8.1)

## 2024-12-19 LAB — UPEP/UIFE/LIGHT CHAINS/TP, 24-HR UR
% BETA, Urine: 39.1 %
ALPHA 1 URINE: 2.3 %
Albumin, U: 14.6 %
Alpha 2, Urine: 12.5 %
Free Kappa Lt Chains,Ur: 532.33 mg/L — ABNORMAL HIGH (ref 1.17–86.46)
Free Kappa/Lambda Ratio: 23.35 — ABNORMAL HIGH (ref 1.83–14.26)
Free Lambda Lt Chains,Ur: 22.8 mg/L — ABNORMAL HIGH (ref 0.27–15.21)
GAMMA GLOBULIN URINE: 31.6 %
M-SPIKE %, Urine: 25.8 % — ABNORMAL HIGH
M-Spike, Mg/24 Hr: 17 mg/(24.h) — ABNORMAL HIGH
Total Protein, Urine-Ur/day: 68 mg/(24.h) (ref 30–150)
Total Protein, Urine: 19.3 mg/dL
Total Volume: 350

## 2024-12-19 LAB — KAPPA/LAMBDA LIGHT CHAINS
Kappa free light chain: 188.4 mg/L — ABNORMAL HIGH (ref 3.3–19.4)
Kappa, lambda light chain ratio: 5.04 — ABNORMAL HIGH (ref 0.26–1.65)
Lambda free light chains: 37.4 mg/L — ABNORMAL HIGH (ref 5.7–26.3)

## 2024-12-24 LAB — PROTEIN ELECTROPHORESIS, SERUM
A/G Ratio: 0.9 (ref 0.7–1.7)
Albumin ELP: 3.7 g/dL (ref 2.9–4.4)
Alpha-1-Globulin: 0.2 g/dL (ref 0.0–0.4)
Alpha-2-Globulin: 0.7 g/dL (ref 0.4–1.0)
Beta Globulin: 1.1 g/dL (ref 0.7–1.3)
Gamma Globulin: 1.8 g/dL (ref 0.4–1.8)
Globulin, Total: 3.9 g/dL (ref 2.2–3.9)
Total Protein ELP: 7.6 g/dL (ref 6.0–8.5)

## 2024-12-24 LAB — IMMUNOFIXATION ELECTROPHORESIS
IgA: 285 mg/dL (ref 61–437)
IgG (Immunoglobin G), Serum: 1866 mg/dL — ABNORMAL HIGH (ref 603–1613)
IgM (Immunoglobulin M), Srm: 51 mg/dL (ref 20–172)
Total Protein ELP: 7.4 g/dL (ref 6.0–8.5)

## 2024-12-25 ENCOUNTER — Inpatient Hospital Stay: Admitting: Physician Assistant

## 2024-12-25 NOTE — Progress Notes (Deleted)
 "  [facility] [address], [place] [address]   CLINIC:  Medical Oncology/Hematology  PCP:  Tobie Suzzane POUR, MD [address] [place] [address] [phone]   REASON FOR VISIT:  Follow-up for elevated free light chains + thymic carcinoma s/p surgical resection  PRIOR THERAPY: Mediastinal mass resection (12/29/2016) **Patient refused adjuvant chemotherapy**  CURRENT THERAPY: Surveillance  INTERVAL HISTORY:   Derrick Gaines 68 y.o. male returns for routine follow-up of elevated free light chains and history of thymic carcinoma.   He was last seen by Dr. Rogers on 06/14/2024.  At today's visit, he reports feeling ***. He has 100***% energy and 100***% appetite.  ***He endorses that he is maintaining a stable weight. ***He denies any new onset bone pains.   ***He denies any B symptoms.  ***He denies any recurrent infections.    ***He denies any neurologic changes, numbness/tingling in hands or feet, headaches, or vision changes.    ***No new masses or lymphadenopathy.   ***He denies any new cough, shortness of breath, chest pain, hoarseness, or upper body swelling.     ASSESSMENT & PLAN:  1.  Light chain MGUS - Seen at the request of Dr. Rachele for abnormal free light chains noted in 2022 - Labs on 04/07/2021: SPEP-poorly defined band of restricted protein mobility in the gamma region.  SIFX-negative.  FLC ratio 4.22, kappa light chains 137, lambda light chain 32.5.  Creatinine was 1.41.  Hemoglobin was normal. - MGUS/myeloma panel (02/14/2023): SPEP unremarkable Serum immunofixation showed POLYCLONAL increase in immunoglobulins Elevated kappa free light chain 154.6, lambda 29.9, elevated ratio 5.17. No apparent CRAB features, with normal Hgb 15.8, baseline creatinine 1.44 (CKD stage IIIb), calcium  8.7.  Normal LDH. - Skeletal survey (11/25/2023): No acute or destructive bony abnormalities - LDH and beta-2  microglobulin normal. - 24-hour urine/UPEP  (12/17/2024):  Elevated urine FLC ratio 23.35 (Of 532.33/lambda 22.80) Bence-Jones protein positive, kappa type Urine M spike positive, 17 mg / 24 hours - Most recent MGUS/myeloma panel (12/17/2024): SPEP negative for M spike Immunofixation with polyclonal increase in immunoglobulins Elevated FLC ratio is stable around 5.04 (kappa 188.4/lambda 37.4) Hemoglobin 15.2/MCV 100.2, creatinine 1.36, calcium  10.4*** - No B symptoms, new onset bone pain, or neuropathy *** - PLAN: No significant change in light chains, but mild elevation in calcium  is noted.  *** Denies any calcium  supplementation.  *** Attention to calcium  with repeat MGUS/myeloma labs in 6 months.  Repeat 24-hour urine/UPEP at follow-up in 6 months.  If any worsening of labs, consider bone marrow biopsy.  ***  2.  Poorly differentiated thymic carcinoma - Presentation in 2017 with weight loss of 40 pounds - PET/CT scan (10/05/2016): Anterior mediastinal mass measuring 3.6 x 1.9 cm with SUV of 6.3 - Mediastinal mass resected on 12/29/2016, with pathology showing poorly differentiated thymic carcinoma with foci of squamous differentiation, negative margin for lung tissue, negative margin for pericardial and pleura, 1 lymph node negative, pT2 pN0 M0. - Adjuvant chemoradiation therapy was recommended, patient declined - CT chest with contrast (08/04/2022): No significant residual findings in the anterior mediastinum with no evidence of thymic mass.  Continued slow growth of pleural-based nodule in the left lung apex without aggressive characteristics.  - CT scan of chest on 01/19/2024 showed increasing LUL posterior nodule - Subsequent PET scan on 03/01/2024: Smoothly marginated slowly enlarging LUL pulmonary nodule along pleural surface, minimally hypermetabolic with SUV max 2.7.  Indeterminate lesion, could represent local recurrence of benign or malignant thymoma  or thyroid  carcinoma. - CT chest on 04/26/2024 showed apical pleural / extrapleural  nodule of the left hemithorax measuring 2.6 x 1.7 cm. Right adrenal adenoma. No adenopathy. *** - Bronchoscopy and biopsy on 05/10/2024: FNA of LUL nodule shows no malignant cells. - No B symptoms, chest pain, or new cough *** - PLAN: Per Dr. Rogers, CT chest without contrast in May 2026.  3.  Tobacco use - Patient has smoked 1 PPD since age 36, has cut back to 0.5 PPD since 2018 *** - PLAN: No need for LDCT chest at this time, since patient is receiving annual CT chest for follow-up of his previous thymic carcinoma. - Patient is interested in smoking cessation. *** Transport planner and information regarding [employer] provided during visit.  4.  Other history - Prior to retirement, he worked at a [employer].  Denies any chemical exposure. - One sister had colon cancer, another sister had breast cancer   PLAN SUMMARY: >> CT chest due around 04/27/2025 >> 24-hour urine/UPEP in 6 months >> Labs in 6 months = CBC/D, CMP, LDH, SPEP, light chains, immunofixation >> OFFICE visit in 6 months (1 week after labs) **Consider MD evaluation every 1 to 2 years, otherwise stable for APP visits**     REVIEW OF SYSTEMS: Patient denies any acute complaints***  Review of Systems  Constitutional:  Negative for appetite change, chills, diaphoresis, fatigue, fever and unexpected weight change.  HENT:   Negative for lump/mass and nosebleeds.   Eyes:  Negative for eye problems.  Respiratory:  Negative for cough, hemoptysis and shortness of breath.   Cardiovascular:  Negative for chest pain, leg swelling and palpitations.  Gastrointestinal:  Negative for abdominal pain, blood in stool, constipation, diarrhea, nausea and vomiting.  Genitourinary:  Negative for hematuria.   Skin: Negative.   Neurological:  Negative for dizziness, headaches and light-headedness.  Hematological:  Does not bruise/bleed easily.     PHYSICAL EXAM:***  ECOG PERFORMANCE STATUS: 0 - Asymptomatic  There were  no vitals filed for this visit.  There were no vitals filed for this visit.  Physical Exam Constitutional:      Appearance: Normal appearance. He is obese.  Cardiovascular:     Heart sounds: Normal heart sounds.  Pulmonary:     Breath sounds: Decreased air movement present. Decreased breath sounds present.  Neurological:     General: No focal deficit present.     Mental Status: Mental status is at baseline.  Psychiatric:        Behavior: Behavior normal. Behavior is cooperative.     PAST MEDICAL/SURGICAL HISTORY:  Past Medical History:  Diagnosis Date   Arthritis    Cancer (HCC)    Chronic kidney disease    Dementia (HCC)    Hypertension    Stroke (HCC)    mild left sided weakness   Vocal cord polyp    Past Surgical History:  Procedure Laterality Date   BACK SURGERY     BRONCHIAL BRUSHINGS  05/10/2024   Procedure: BRONCHOSCOPY, WITH BRUSH BIOPSY;  Surgeon: Shyrl Linnie KIDD, MD;  Location: [facility];  Service: Thoracic;;   BRONCHIAL NEEDLE ASPIRATION BIOPSY  05/10/2024   Procedure: BRONCHOSCOPY, WITH NEEDLE ASPIRATION BIOPSY;  Surgeon: Shyrl Linnie KIDD, MD;  Location: [facility];  Service: Thoracic;;   MICROLARYNGOSCOPY N/A 08/29/2018   Procedure: DIRECT MICROLARYNGOSCOPY WITH EXCISION OF LARYNGEAL MASS;  Surgeon: Karis Clunes, MD;  Location: [facility];  Service: ENT;  Laterality: N/A;   STERNOTOMY  THYMECTOMY     VIDEO BRONCHOSCOPY WITH ENDOBRONCHIAL NAVIGATION N/A 05/10/2024   Procedure: VIDEO BRONCHOSCOPY WITH ENDOBRONCHIAL NAVIGATION;  Surgeon: Shyrl Linnie KIDD, MD;  Location: [facility];  Service: Thoracic;  Laterality: N/A;    SOCIAL HISTORY:  Social History   Socioeconomic History   Marital status: Married    Spouse name: Not on file   Number of children: Not on file   Years of education: Not on file   Highest education level: Not on file  Occupational History   Not on file  Tobacco Use   Smoking status: Every Day     Current packs/day: 0.50    Average packs/day: 0.5 packs/day for 30.0 years (15.0 ttl pk-yrs)    Types: Cigarettes   Smokeless tobacco: Never   Tobacco comments:    1 ppd as of 02/24/24  Vaping Use   Vaping status: Never Used  Substance and Sexual Activity   Alcohol  use: No   Drug use: No   Sexual activity: Yes  Other Topics Concern   Not on file  Social History Narrative   Been on disability since age 37 due to a stroke.    Used to work at [employer].    Married. No children. Married for 15 years.   Smoke cigarettes, since age 2 years.    No alcohol . No drugs.   Enjoys fishing.    Attends church.   Eats all foods.   Wear seatbelt.    Drives.    Right handed   One story home   Lives with wife , and son and wifes two sisters   Social Drivers of Health   Tobacco Use: High Risk (08/21/2024)   Patient History    Smoking Tobacco Use: Every Day    Smokeless Tobacco Use: Never    Passive Exposure: Not on file  Financial Resource Strain: Not on file  Food Insecurity: No Food Insecurity (04/27/2023)   Hunger Vital Sign    Worried About Running Out of Food in the Last Year: Never true    Ran Out of Food in the Last Year: Never true  Transportation Needs: No Transportation Needs (04/27/2023)   PRAPARE - Administrator, Civil Service (Medical): No    Lack of Transportation (Non-Medical): No  Physical Activity: Not on file  Stress: Not on file  Social Connections: Not on file  Intimate Partner Violence: Not At Risk (04/27/2023)   Humiliation, Afraid, Rape, and Kick questionnaire    Fear of Current or Ex-Partner: No    Emotionally Abused: No    Physically Abused: No    Sexually Abused: No  Depression (PHQ2-9): Low Risk (08/06/2024)   Depression (PHQ2-9)    PHQ-2 Score: 0  Recent Concern: Depression (PHQ2-9) - Medium Risk (06/26/2024)   Depression (PHQ2-9)    PHQ-2 Score: 7  Alcohol  Screen: Not on file  Housing: Low Risk (04/27/2023)   Housing    Last Housing Risk Score: 0   Utilities: Not At Risk (04/27/2023)   AHC Utilities    Threatened with loss of utilities: No  Health Literacy: Not on file    FAMILY HISTORY:  Family History  Problem Relation Age of Onset   Diabetes Mother    Cancer Mother    Colon cancer Sister    Pneumonia Father    Emphysema Brother     CURRENT MEDICATIONS:  Outpatient Encounter Medications as of 12/26/2024  Medication Sig   amLODipine  (NORVASC ) 10 MG tablet Take 1 tablet (10 mg  total) by mouth daily.   aspirin  EC 81 MG tablet Take 81 mg by mouth daily. Swallow whole.   atorvastatin  (LIPITOR) 40 MG tablet Take 1 tablet (40 mg total) by mouth daily.   Cyanocobalamin  (B-12) 1000 MCG TABS Take by mouth.   DULoxetine  (CYMBALTA ) 30 MG capsule Take 1 capsule (30 mg total) by mouth 2 (two) times daily.   hydrochlorothiazide  (HYDRODIURIL ) 12.5 MG tablet Take 1 tablet (12.5 mg total) by mouth daily.   losartan  (COZAAR ) 25 MG tablet Take 1 tablet (25 mg total) by mouth daily.   memantine  (NAMENDA ) 10 MG tablet Take 1 tablet (10 mg at night) for 2 weeks, then increase to 1 tablet (10 mg) twice a day   Riboflavin (B-2) 100 MG TABS Take by mouth.   thiamine  (VITAMIN B1) 100 MG tablet Take 1 tablet (100 mg total) by mouth daily.   No facility-administered encounter medications on file as of 12/26/2024.    ALLERGIES:  No Known Allergies  LABORATORY DATA:  I have reviewed the labs as listed.  CBC    Component Value Date/Time   WBC 6.8 12/18/2024 1445   RBC 4.66 12/18/2024 1445   HGB 15.2 12/18/2024 1445   HGB 15.0 02/21/2024 1445   HCT 46.7 12/18/2024 1445   HCT 44.6 02/21/2024 1445   PLT 319 12/18/2024 1445   PLT 337 02/21/2024 1445   MCV 100.2 (H) 12/18/2024 1445   MCV 98 (H) 02/21/2024 1445   MCH 32.6 12/18/2024 1445   MCHC 32.5 12/18/2024 1445   RDW 12.7 12/18/2024 1445   RDW 11.7 02/21/2024 1445   LYMPHSABS 2.9 12/18/2024 1445   LYMPHSABS 2.8 02/21/2024 1445   MONOABS 0.7 12/18/2024 1445   EOSABS 0.2 12/18/2024 1445    EOSABS 0.2 02/21/2024 1445   BASOSABS 0.1 12/18/2024 1445   BASOSABS 0.1 02/21/2024 1445      Latest Ref Rng & Units 12/18/2024    2:45 PM 06/06/2024    1:38 PM 05/08/2024   10:23 AM  CMP  Glucose 70 - 99 mg/dL 769  898  881   BUN 8 - 23 mg/dL 18  21  17    Creatinine 0.61 - 1.24 mg/dL 8.63  8.48  8.58   Sodium 135 - 145 mmol/L 139  138  138   Potassium 3.5 - 5.1 mmol/L 3.7  3.5  3.7   Chloride 98 - 111 mmol/L 101  103  103   CO2 22 - 32 mmol/L 25  26  28    Calcium  8.9 - 10.3 mg/dL 89.5  9.5  9.5   Total Protein 6.5 - 8.1 g/dL 8.0  7.5  7.8   Total Bilirubin 0.0 - 1.2 mg/dL 0.5  0.7  0.5   Alkaline Phos 38 - 126 U/L 94  64  57   AST 15 - 41 U/L 24  18  21    ALT 0 - 44 U/L 22  18  17      DIAGNOSTIC IMAGING:  I have independently reviewed the relevant imaging and discussed with the patient.   WRAP UP:  All questions were answered. The patient knows to call the clinic with any problems, questions or concerns.  Medical decision making: Moderate  Time spent on visit: I spent 20 minutes counseling the patient face to face. The total time spent in the appointment was 30 minutes and more than 50% was on counseling.  Pleasant CHRISTELLA Barefoot, PA-C  *** "

## 2024-12-26 ENCOUNTER — Other Ambulatory Visit: Payer: Self-pay

## 2024-12-26 ENCOUNTER — Inpatient Hospital Stay: Admitting: Physician Assistant

## 2024-12-26 ENCOUNTER — Encounter: Payer: Self-pay | Admitting: Physician Assistant

## 2024-12-26 DIAGNOSIS — D472 Monoclonal gammopathy: Secondary | ICD-10-CM

## 2024-12-26 DIAGNOSIS — R7689 Other specified abnormal immunological findings in serum: Secondary | ICD-10-CM

## 2024-12-26 DIAGNOSIS — Z5321 Procedure and treatment not carried out due to patient leaving prior to being seen by health care provider: Secondary | ICD-10-CM

## 2024-12-26 NOTE — Progress Notes (Signed)
 INCOMPLETE TELEPHONE VISIT (no charge) Patient was scheduled for office visit this morning, but was no call/no-show. Patient's wife called and requested phone visit later this afternoon. However, when I called for phone visit, patient was not present, only his wife (Derrick Gaines). Discussed with patient's wife that even though she is the primary historian (patient has dementia), patient still needs to be present during the call in order to complete visit.  As such, today's visit was NOT completed and will not be charged.  I did go over lab results with patient's wife regarding his light chain MGUS.  We discussed that although there is no significant change in his light chains, he does have mild elevation in calcium .  We will repeat MGUS/myeloma labs in 4 months, along with repeat 24-hour urine/UPEP.  If any worsening of labs, will consider bone marrow biopsy.  Since patient is already scheduled for his CT scan and follow-up with Dr. Davonna in May 2026 for follow-up of thymic carcinoma, patient's wife did not wish to reschedule today's incomplete visit.  She will reach out sooner if he has any new concerns that warrant sooner evaluation.   Pleasant Derrick Barefoot, PA-C 12/26/2024 3:36 PM

## 2025-01-01 ENCOUNTER — Encounter: Payer: Self-pay | Admitting: Internal Medicine

## 2025-01-01 ENCOUNTER — Ambulatory Visit: Admitting: Internal Medicine

## 2025-01-01 VITALS — BP 126/81 | HR 91 | Ht 72.0 in | Wt 254.4 lb

## 2025-01-01 DIAGNOSIS — F01C18 Vascular dementia, severe, with other behavioral disturbance: Secondary | ICD-10-CM | POA: Diagnosis not present

## 2025-01-01 DIAGNOSIS — F3341 Major depressive disorder, recurrent, in partial remission: Secondary | ICD-10-CM

## 2025-01-01 DIAGNOSIS — J439 Emphysema, unspecified: Secondary | ICD-10-CM | POA: Diagnosis not present

## 2025-01-01 DIAGNOSIS — I10 Essential (primary) hypertension: Secondary | ICD-10-CM | POA: Diagnosis not present

## 2025-01-01 DIAGNOSIS — R7303 Prediabetes: Secondary | ICD-10-CM

## 2025-01-01 DIAGNOSIS — E1165 Type 2 diabetes mellitus with hyperglycemia: Secondary | ICD-10-CM

## 2025-01-01 DIAGNOSIS — E538 Deficiency of other specified B group vitamins: Secondary | ICD-10-CM | POA: Diagnosis not present

## 2025-01-01 DIAGNOSIS — E1122 Type 2 diabetes mellitus with diabetic chronic kidney disease: Secondary | ICD-10-CM

## 2025-01-01 DIAGNOSIS — Z23 Encounter for immunization: Secondary | ICD-10-CM

## 2025-01-01 DIAGNOSIS — E782 Mixed hyperlipidemia: Secondary | ICD-10-CM | POA: Diagnosis not present

## 2025-01-01 DIAGNOSIS — N1831 Chronic kidney disease, stage 3a: Secondary | ICD-10-CM | POA: Diagnosis not present

## 2025-01-01 MED ORDER — DULOXETINE HCL 30 MG PO CPEP: 30.0000 mg | ORAL_CAPSULE | Freq: Two times a day (BID) | ORAL | 5 refills | Status: AC

## 2025-01-01 MED ORDER — ALBUTEROL SULFATE HFA 108 (90 BASE) MCG/ACT IN AERS: 2.0000 | INHALATION_SPRAY | Freq: Four times a day (QID) | RESPIRATORY_TRACT | 2 refills | Status: AC | PRN

## 2025-01-01 MED ORDER — BLOOD GLUCOSE MONITORING SUPPL DEVI: 1.0000 | 0 refills | Status: AC

## 2025-01-01 MED ORDER — LANCETS MISC: 1.0000 | 0 refills | Status: AC

## 2025-01-01 MED ORDER — METFORMIN HCL ER 500 MG PO TB24: 500.0000 mg | ORAL_TABLET | Freq: Every day | ORAL | 1 refills | Status: AC

## 2025-01-01 MED ORDER — BLOOD GLUCOSE TEST VI STRP: 1.0000 | ORAL_STRIP | 0 refills | Status: AC

## 2025-01-01 NOTE — Progress Notes (Signed)
 "  Established Patient Office Visit  Subjective:  Patient ID: Derrick Gaines, male    DOB: Aug 23, 1956  Age: 69 y.o. MRN: [id number]  CC:  Chief Complaint  Patient presents with   Hypertension    Follow up   Follow-up    Follow up    HPI Derrick Gaines is a 69 y.o. male with past medical history of HTN, CVA, CKD, HLD, thymoma s/p resection and tobacco abuse who presents for follow-up of his chronic medical conditions.  HTN: His BP is WNL.  He currently takes amlodipine  10 mg QD, losartan  25 mg QD and hydrochlorothiazide  12.5 mg once daily. He had hospitalization due to elevated troponin in 05/24, which was deemed to be due to demand ischemia in the setting of hypertensive urgency.  He currently denies any headache, dizziness, chest pain, dyspnea or palpitations.  Type II DM: His HbA1c was 8.1 today.  His wife reports that he has been eating a lot of cookies lately.  His random blood glucose was 230 on CMP in 12/25.  Does not report polyuria or polydipsia currently.  History of CVA: His wife reports history of CVA, with residual mild right UE weakness.  Patient himself denies any numbness or weakness of the UE or LE.  He has memory deficits for the last few years, which has been progressing.  He has been forgetting streets and his wife has stopped him from driving due to irrational driving.  Chart review suggest that he used to take alcohol  with his friends and smokes weed at times.  Denies any other illicit drug use.  His wife also reports that his gait has been disturbed, but denies urinary or stool incontinence.  He had MRI of brain, which showed chronically advanced small vessel disease with progressive encephalomalacia.  He also had neurology evaluation and was told of vascular dementia. He has been placed on Namenda  now. He has started taking vitamin B12 supplement now.  CKD: Used to follow up with Dr. Rachele.  He had elevated immunoglobulin light chains and has seen oncology for it.  Denies  any dysuria, hematuria or urinary hesitancy or resistance.  History of thymoma s/p resection: He refused chemoradiation.  He is under annual CT chest surveillance.  He smokes 1 pack/day.  He is trying to cut down.  He has wheezing today, likely due to cold weather.  He himself denies any dyspnea.  Does not report chronic cough.  MDD: He has been taking Cymbalta  30 mg BID. He denies anhedonia, anxiety, SI or HI currently.  He takes naps during daytime, and has difficulty maintaining sleep at nighttime.   Past Medical History:  Diagnosis Date   Arthritis    Cancer (HCC)    Chronic kidney disease    Dementia (HCC)    Hypertension    Stroke (HCC)    mild left sided weakness   Vocal cord polyp     Past Surgical History:  Procedure Laterality Date   BACK SURGERY     BRONCHIAL BRUSHINGS  05/10/2024   Procedure: BRONCHOSCOPY, WITH BRUSH BIOPSY;  Surgeon: Shyrl Linnie KIDD, MD;  Location: [facility];  Service: Thoracic;;   BRONCHIAL NEEDLE ASPIRATION BIOPSY  05/10/2024   Procedure: BRONCHOSCOPY, WITH NEEDLE ASPIRATION BIOPSY;  Surgeon: Shyrl Linnie KIDD, MD;  Location: [facility];  Service: Thoracic;;   MICROLARYNGOSCOPY N/A 08/29/2018   Procedure: DIRECT MICROLARYNGOSCOPY WITH EXCISION OF LARYNGEAL MASS;  Surgeon: Karis Clunes, MD;  Location: [facility];  Service: ENT;  Laterality: N/A;   STERNOTOMY     THYMECTOMY     VIDEO BRONCHOSCOPY WITH ENDOBRONCHIAL NAVIGATION N/A 05/10/2024   Procedure: VIDEO BRONCHOSCOPY WITH ENDOBRONCHIAL NAVIGATION;  Surgeon: Shyrl Linnie KIDD, MD;  Location: [facility];  Service: Thoracic;  Laterality: N/A;    Family History  Problem Relation Age of Onset   Diabetes Mother    Cancer Mother    Colon cancer Sister    Pneumonia Father    Emphysema Brother     Social History   Socioeconomic History   Marital status: Married    Spouse name: Not on file   Number of children: Not on file   Years of education: Not on file   Highest  education level: Not on file  Occupational History   Not on file  Tobacco Use   Smoking status: Every Day    Current packs/day: 0.50    Average packs/day: 0.5 packs/day for 30.0 years (15.0 ttl pk-yrs)    Types: Cigarettes   Smokeless tobacco: Never   Tobacco comments:    1 ppd as of 01/01/25  Vaping Use   Vaping status: Never Used  Substance and Sexual Activity   Alcohol  use: No   Drug use: No   Sexual activity: Yes  Other Topics Concern   Not on file  Social History Narrative   Been on disability since age 40 due to a stroke.    Used to work at [employer].    Married. No children. Married for 15 years.   Smoke cigarettes, since age 52 years.    No alcohol . No drugs.   Enjoys fishing.    Attends church.   Eats all foods.   Wear seatbelt.    Drives.    Right handed   One story home   Lives with wife , and son and wifes two sisters   Social Drivers of Health   Tobacco Use: High Risk (01/01/2025)   Patient History    Smoking Tobacco Use: Every Day    Smokeless Tobacco Use: Never    Passive Exposure: Not on file  Financial Resource Strain: Not on file  Food Insecurity: No Food Insecurity (04/27/2023)   Hunger Vital Sign    Worried About Running Out of Food in the Last Year: Never true    Ran Out of Food in the Last Year: Never true  Transportation Needs: No Transportation Needs (04/27/2023)   PRAPARE - Administrator, Civil Service (Medical): No    Lack of Transportation (Non-Medical): No  Physical Activity: Not on file  Stress: Not on file  Social Connections: Not on file  Intimate Partner Violence: Not At Risk (04/27/2023)   Humiliation, Afraid, Rape, and Kick questionnaire    Fear of Current or Ex-Partner: No    Emotionally Abused: No    Physically Abused: No    Sexually Abused: No  Depression (PHQ2-9): High Risk (01/01/2025)   Depression (PHQ2-9)    PHQ-2 Score: 14  Alcohol  Screen: Not on file  Housing: Low Risk (04/27/2023)   Housing    Last Housing Risk  Score: 0  Utilities: Not At Risk (04/27/2023)   AHC Utilities    Threatened with loss of utilities: No  Health Literacy: Not on file    ROS Review of Systems  Constitutional:  Negative for chills and fever.  HENT:  Negative for congestion and sore throat.   Eyes:  Negative for pain and discharge.  Respiratory:  Negative for cough and shortness of breath.  Cardiovascular:  Negative for chest pain and palpitations.  Gastrointestinal:  Negative for diarrhea, nausea and vomiting.  Endocrine: Negative for polydipsia and polyuria.  Genitourinary:  Negative for dysuria and hematuria.  Musculoskeletal:  Negative for neck pain and neck stiffness.  Skin:  Negative for rash.  Neurological:  Negative for dizziness, weakness, numbness and headaches.  Psychiatric/Behavioral:  Positive for confusion. Negative for agitation and behavioral problems.     Objective:   Today's Vitals: BP 126/81   Pulse 91   Ht 6' (1.829 m)   Wt 254 lb 6.4 oz (115.4 kg)   SpO2 92%   BMI 34.50 kg/m   Physical Exam Vitals reviewed.  Constitutional:      General: He is not in acute distress.    Appearance: He is not diaphoretic.  HENT:     Head: Normocephalic and atraumatic.     Nose: Nose normal.     Mouth/Throat:     Mouth: Mucous membranes are moist.  Eyes:     General: No scleral icterus.    Extraocular Movements: Extraocular movements intact.  Cardiovascular:     Rate and Rhythm: Normal rate and regular rhythm.     Heart sounds: Normal heart sounds. No murmur heard. Pulmonary:     Breath sounds: Normal breath sounds. No wheezing or rales.  Abdominal:     Palpations: Abdomen is soft.     Tenderness: There is no abdominal tenderness.  Musculoskeletal:     Cervical back: Neck supple. No tenderness.     Right lower leg: No edema.     Left lower leg: No edema.  Skin:    General: Skin is warm.     Findings: No rash.  Neurological:     General: No focal deficit present.     Mental Status: He is  alert and oriented to person, place, and time.     Sensory: No sensory deficit.     Motor: No weakness.     Gait: Gait abnormal.  Psychiatric:        Mood and Affect: Mood normal.        Behavior: Behavior is cooperative.     Assessment & Plan:   Problem List Items Addressed This Visit       Cardiovascular and Mediastinum   Essential hypertension   BP Readings from Last 1 Encounters:  01/01/25 126/81   Usually Well-controlled with Losartan  25 mg QD, Amlodipine  10 mg QD and hydrochlorothiazide  12.5 mg QD Counseled for compliance with the medications Advised DASH diet and moderate exercise/walking, at least 150 mins/week        Respiratory   Pulmonary emphysema (HCC)   Has mild wheezing today Previous CT chest showed mild emphysema Needs to cut down -> quit smoking Albuterol  inhaler as needed for dyspnea or wheezing      Relevant Medications   albuterol  (VENTOLIN  HFA) 108 (90 Base) MCG/ACT inhaler     Endocrine   Type 2 diabetes mellitus with hyperglycemia (HCC) - Primary   POC HbA1c: 8.1  New onset Started metformin  500 mg QD Advised to follow diabetic diet On statin and ARB F/u CMP and lipid panel Diabetic eye exam: Advised to follow up with Ophthalmology for diabetic eye exam      Relevant Medications   metFORMIN  (GLUCOPHAGE -XR) 500 MG 24 hr tablet   Blood Glucose Monitoring Suppl DEVI   Glucose Blood (BLOOD GLUCOSE TEST STRIPS) STRP   Lancets MISC     Nervous and Auditory   Severe vascular  dementia [facility])   MRI brain reviewed MoCA: 9/30 during neurology evaluation On Namenda  10 mg BID now Needs to be compliant to HTN medicines Had low B12 and B1 levels - on supplements now, needs to continue thiamine  supplement      Relevant Medications   DULoxetine  (CYMBALTA ) 30 MG capsule     Genitourinary   CKD (chronic kidney disease) stage 3, GFR 30-59 ml/min (HCC)   GFR stays around 50-55, last BMP showed GFR of 57 Used to be followed by Dr. Rachele On  ARB Maintain adequate hydration        Other   Mixed hyperlipidemia   On Lipitor 40 mg once daily Check lipid profile      B12 deficiency   Lab Results  Component Value Date   VITAMINB12 719 02/21/2024   Improved now On oral Vit B12 1000 mcg once daily      MDD (major depressive disorder), recurrent, in partial remission   Well controlled with Cymbalta  30 mg BID Needs to avoid substance use including alcohol , tobacco and marijuana      Relevant Medications   DULoxetine  (CYMBALTA ) 30 MG capsule   Other Visit Diagnoses       Encounter for immunization       Relevant Orders   Flu vaccine HIGH DOSE PF(Fluzone Trivalent) (Completed)     Prediabetes       Relevant Orders   Bayer DCA Hb A1c Waived         Outpatient Encounter Medications as of 01/01/2025  Medication Sig   albuterol  (VENTOLIN  HFA) 108 (90 Base) MCG/ACT inhaler Inhale 2 puffs into the lungs every 6 (six) hours as needed for wheezing or shortness of breath.   amLODipine  (NORVASC ) 10 MG tablet Take 1 tablet (10 mg total) by mouth daily.   aspirin  EC 81 MG tablet Take 81 mg by mouth daily. Swallow whole.   atorvastatin  (LIPITOR) 40 MG tablet Take 1 tablet (40 mg total) by mouth daily.   Blood Glucose Monitoring Suppl DEVI 1 each by Does not apply route as directed. Dispense based on patient and insurance preference. Use up to four times daily as directed. (FOR ICD-10 E10.9, E11.9).   Cyanocobalamin  (B-12) 1000 MCG TABS Take by mouth.   Glucose Blood (BLOOD GLUCOSE TEST STRIPS) STRP 1 each by Does not apply route as directed. Dispense based on patient and insurance preference. Use up to four times daily as directed. (FOR ICD-10 E10.9, E11.9).   hydrochlorothiazide  (HYDRODIURIL ) 12.5 MG tablet Take 1 tablet (12.5 mg total) by mouth daily.   Lancets MISC 1 each by Does not apply route as directed. Dispense based on patient and insurance preference. Use up to four times daily as directed. (FOR ICD-10 E10.9, E11.9).    losartan  (COZAAR ) 25 MG tablet Take 1 tablet (25 mg total) by mouth daily.   memantine  (NAMENDA ) 10 MG tablet Take 1 tablet (10 mg at night) for 2 weeks, then increase to 1 tablet (10 mg) twice a day   metFORMIN  (GLUCOPHAGE -XR) 500 MG 24 hr tablet Take 1 tablet (500 mg total) by mouth daily with breakfast.   Riboflavin (B-2) 100 MG TABS Take by mouth.   thiamine  (VITAMIN B1) 100 MG tablet Take 1 tablet (100 mg total) by mouth daily.   [DISCONTINUED] DULoxetine  (CYMBALTA ) 30 MG capsule Take 1 capsule (30 mg total) by mouth 2 (two) times daily.   DULoxetine  (CYMBALTA ) 30 MG capsule Take 1 capsule (30 mg total) by mouth 2 (two) times daily.  No facility-administered encounter medications on file as of 01/01/2025.    Follow-up: Return in about 6 months (around 07/01/2025) for HTN and CKD.   Suzzane MARLA Blanch, MD "

## 2025-01-01 NOTE — Assessment & Plan Note (Signed)
 MRI brain reviewed MoCA: 9/30 during neurology evaluation On Namenda  10 mg BID now Needs to be compliant to HTN medicines Had low B12 and B1 levels - on supplements now, needs to continue thiamine  supplement

## 2025-01-01 NOTE — Assessment & Plan Note (Signed)
 POC HbA1c: 8.1  New onset Started metformin  500 mg QD Advised to follow diabetic diet On statin and ARB F/u CMP and lipid panel Diabetic eye exam: Advised to follow up with Ophthalmology for diabetic eye exam

## 2025-01-01 NOTE — Assessment & Plan Note (Signed)
 GFR stays around 50-55, last BMP showed GFR of 57 Used to be followed by Dr. Rachele On ARB Maintain adequate hydration

## 2025-01-01 NOTE — Patient Instructions (Signed)
 Schedule your Medicare Annual Wellness Visit at checkout.  Please start using Albuterol  inhaler as needed for shortness of breath or wheezing.  Please try to cut down -> quit smoking.  Please continue to take medications as prescribed.  Please continue to follow low salt diet and perform moderate exercise/walking as tolerated.

## 2025-01-01 NOTE — Assessment & Plan Note (Signed)
 Has mild wheezing today Previous CT chest showed mild emphysema Needs to cut down -> quit smoking Albuterol  inhaler as needed for dyspnea or wheezing

## 2025-01-01 NOTE — Assessment & Plan Note (Signed)
 Lab Results  Component Value Date   VITAMINB12 719 02/21/2024   Improved now On oral Vit B12 1000 mcg once daily

## 2025-01-01 NOTE — Assessment & Plan Note (Addendum)
 BP Readings from Last 1 Encounters:  01/01/25 126/81   Usually Well-controlled with Losartan  25 mg QD, Amlodipine  10 mg QD and hydrochlorothiazide  12.5 mg QD Counseled for compliance with the medications Advised DASH diet and moderate exercise/walking, at least 150 mins/week

## 2025-01-01 NOTE — Assessment & Plan Note (Signed)
 On Lipitor 40 mg once daily Check lipid profile

## 2025-01-01 NOTE — Assessment & Plan Note (Signed)
 Well controlled with Cymbalta  30 mg BID Needs to avoid substance use including alcohol , tobacco and marijuana

## 2025-01-08 LAB — BAYER DCA HB A1C WAIVED: HB A1C (BAYER DCA - WAIVED): 8.1 % — ABNORMAL HIGH (ref 4.8–5.6)

## 2025-01-15 ENCOUNTER — Telehealth: Payer: Self-pay

## 2025-01-15 NOTE — Telephone Encounter (Signed)
 Copied from CRM #[id number]. Topic: General - Other >> Jan 15, 2025  4:19 PM Jasmin G wrote: Reason for CRM: [facility] requested for pt's health summary to be faxed over at [phone].

## 2025-01-16 ENCOUNTER — Telehealth: Payer: Self-pay | Admitting: Internal Medicine

## 2025-01-16 NOTE — Telephone Encounter (Signed)
 [facility], need more specific details on what they are needing faxed over

## 2025-01-16 NOTE — Telephone Encounter (Signed)
 faxed

## 2025-01-16 NOTE — Telephone Encounter (Signed)
 Copied from CRM #[id number]. Topic: General - Other >> Jan 16, 2025 10:51 AM Nessti S wrote: Reason for CRM: maria called because she received vm. She stated that they are requesting pt medication list and doctors summary visit to be faxed

## 2025-04-29 ENCOUNTER — Other Ambulatory Visit (HOSPITAL_COMMUNITY)

## 2025-04-29 ENCOUNTER — Other Ambulatory Visit

## 2025-05-06 ENCOUNTER — Ambulatory Visit: Admitting: Oncology

## 2025-07-02 ENCOUNTER — Ambulatory Visit: Payer: Self-pay | Admitting: Internal Medicine
